# Patient Record
Sex: Male | Born: 2000 | Hispanic: Yes | Marital: Single | Smoking: Never smoker
Health system: Southern US, Community
[De-identification: ages and names within clinical notes are randomized; demographics above are authoritative.]

## PROBLEM LIST (undated history)

## (undated) DIAGNOSIS — R51 Headache: Secondary | ICD-10-CM

## (undated) DIAGNOSIS — R519 Headache, unspecified: Secondary | ICD-10-CM

## (undated) DIAGNOSIS — R002 Palpitations: Secondary | ICD-10-CM

## (undated) DIAGNOSIS — K5190 Ulcerative colitis, unspecified, without complications: Secondary | ICD-10-CM

## (undated) DIAGNOSIS — I517 Cardiomegaly: Secondary | ICD-10-CM

## (undated) HISTORY — DX: Ulcerative colitis, unspecified, without complications: K51.90

## (undated) HISTORY — DX: Palpitations: R00.2

## (undated) HISTORY — DX: Headache, unspecified: R51.9

## (undated) HISTORY — PX: NO PAST SURGERIES: SHX2092

## (undated) HISTORY — DX: Headache: R51

---
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
## 2018-01-03 ENCOUNTER — Ambulatory Visit: Payer: Managed Care, Other (non HMO) | Admitting: Family Medicine

## 2018-01-03 ENCOUNTER — Encounter: Payer: Self-pay | Admitting: Family Medicine

## 2018-01-03 VITALS — BP 124/83 | HR 78 | Temp 98.7°F | Resp 20 | Ht 62.75 in | Wt 187.0 lb

## 2018-01-03 DIAGNOSIS — Z131 Encounter for screening for diabetes mellitus: Secondary | ICD-10-CM

## 2018-01-03 DIAGNOSIS — Z130 Encounter for screening for diseases of the blood and blood-forming organs and certain disorders involving the immune mechanism: Secondary | ICD-10-CM

## 2018-01-03 DIAGNOSIS — Z7689 Persons encountering health services in other specified circumstances: Secondary | ICD-10-CM

## 2018-01-03 DIAGNOSIS — E669 Obesity, unspecified: Secondary | ICD-10-CM | POA: Diagnosis not present

## 2018-01-03 DIAGNOSIS — R002 Palpitations: Secondary | ICD-10-CM | POA: Diagnosis not present

## 2018-01-03 DIAGNOSIS — Z0001 Encounter for general adult medical examination with abnormal findings: Secondary | ICD-10-CM

## 2018-01-03 LAB — LIPID PANEL: LDL Cholesterol: 115

## 2018-01-03 NOTE — Patient Instructions (Signed)

## 2018-01-03 NOTE — Progress Notes (Signed)
Patient ID: Willie James, male  DOB: Aug 26, 2000, 17 y.o.   MRN: [id number] Patient Care Team    Relationship Specialty Notifications Start End  Ma Hillock, DO PCP - General Family Medicine  01/03/18     Chief Complaint  Patient presents with  . Establish Care  . Annual Exam    Subjective:  Willie James is a 17 y.o.  male present for new patient establishment. All past medical history, surgical history, allergies, family history, immunizations, medications and social history were updated in the electronic medical record today. All recent labs, ED visits and hospitalizations within the last year were reviewed.  Moved from [place] with his parents and bother this year. Started Summer classes at [employer]. Has classes schedule for fall. 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  Living with parents currently. Does not routinely exercise. He had an episode with chest pain - which he states his heart felt like it skipped or stopped when he was playing a sport in 2017. He states it happened a few times. He had and ekg which showed LVH, and then a normal echo.   Health maintenance:  Colonoscopy: no fhx, routine screen.  Immunizations: tdap UTD 2017, Influenza UTD 2018 (encouraged yearly) Infectious disease screening: HIV not indicated (never SA) Assistive device: none Oxygen IRC:VELF Patient has a Dental home. Hospitalizations/ED visits: reviewed.  Establishing with eye doctor and dentist. New to area.   Depression screen [facility] 2/9 01/03/2018  Decreased Interest 0  Down, Depressed, Hopeless 0  PHQ - 2 Score 0   No flowsheet data found.  Current Exercise Habits: The patient does not participate in regular exercise at present Exercise limited by: None identified No flowsheet data found.   Immunization History  Administered Date(s) Administered  . DTaP 04/13/1999, 06/22/1999, 08/04/1999, 09/02/2000, 03/19/2004  . HPV Bivalent 05/05/2002, 02/25/2012, 09/22/2012  . HPV  Quadrivalent 10/27/2015  . Hepatitis A 02/07/2009, 08/15/2009  . Hepatitis B 05/21/1999, 03/16/1999, 09/25/1999  . HiB (PRP-OMP) 04/13/1999, 05/29/1999, 08/04/1999, 05/27/2000  . IPV 05/29/1999, 06/22/1999, 09/25/1999, 03/19/2004  . Influenza Split 03/14/2010, 04/08/2011, 03/17/2013  . Influenza-Unspecified 04/09/2016, 02/21/2017  . MMR 05/27/2000, 02/04/2003  . Meningococcal Conjugate 01/11/2011  . Meningococcal Polysaccharide 10/27/2015  . Pneumococcal Conjugate-13 09/25/1999, 12/25/1999, 09/02/2000  . Tdap 01/11/2011, 10/27/2015  . Varicella 02/05/2000, 03/14/2010, 10/27/2015    No exam data present  Past Medical History:  Diagnosis Date  . Frequent headaches   . Palpitations 10/30/2015   pt reports palpitaitons and chest discomfort. EKG (reported LVH) and then NORMAL echo completed.    No Known Allergies Past Surgical History:  Procedure Laterality Date  . NO PAST SURGERIES     Family History  Problem Relation Age of Onset  . Hyperlipidemia Father   . Arthritis Maternal Grandmother   . Diabetes Maternal Grandmother   . Hyperlipidemia Maternal Grandmother   . Hypertension Maternal Grandmother   . Hypertension Maternal Grandfather   . Hyperlipidemia Maternal Grandfather   . Prostate cancer Maternal Grandfather   . Cervical cancer Paternal Grandmother   . Hypertension Paternal Grandfather   . Heart disease Paternal Grandfather    Social History   Socioeconomic History  . Marital status: Single    Spouse name: Not on file  . Number of children: Not on file  . Years of education: Not on file  . Highest education level: Not on file  Occupational History  . Not on file  Social Needs  . Financial resource strain: Not on file  .  Food insecurity:    Worry: Not on file    Inability: Not on file  . Transportation needs:    Medical: Not on file    Non-medical: Not on file  Tobacco Use  . Smoking status: Never Smoker  . Smokeless tobacco: Never Used  Substance and  Sexual Activity  . Alcohol use: Never    Frequency: Never  . Drug use: Never  . Sexual activity: Never  Lifestyle  . Physical activity:    Days per week: Not on file    Minutes per session: Not on file  . Stress: Not on file  Relationships  . Social connections:    Talks on phone: Not on file    Gets together: Not on file    Attends religious service: Not on file    Active member of club or organization: Not on file    Attends meetings of clubs or organizations: Not on file    Relationship status: Not on file  . Intimate partner violence:    Fear of current or ex partner: Not on file    Emotionally abused: Not on file    Physically abused: Not on file    Forced sexual activity: Not on file  Other Topics Concern  . Not on file  Social History Narrative   Marital status/children/pets: Single.    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     All past medical history, surgical history, allergies, family history, immunizations andmedications were updated in the EMR today and reviewed under the history and medication portions of their EMR.    No results found for this or any previous visit (from the past 2160 hour(s)).  Patient was never admitted.   ROS: 14 pt review of systems performed and negative (unless mentioned in an HPI)  Objective: BP 124/83 (BP Location: Right Arm, Patient Position: Sitting, Cuff Size: Large)   Pulse 78   Temp 98.7 F (37.1 C)   Resp 20   Ht 5' 2.75" (1.594 m)   Wt 187 lb (84.8 kg)   SpO2 98%   BMI 33.39 kg/m  Gen: Afebrile. No acute distress. Nontoxic in appearance, well-developed, well-nourished,  Obese, male.  HENT: AT. [place].  Bilateral TM visualized and normal in appearance, normal external auditory canal. MMM, no oral lesions, adequate dentition. Bilateral nares within normal limits. Throat without erythema, ulcerations or exudates. no Cough on exam, no hoarseness on exam. Eyes:Pupils Equal Round Reactive to light, Extraocular movements intact,  Conjunctiva without redness, discharge or icterus. Neck/lymp/endocrine: Supple,no lymphadenopathy, no thyromegaly CV: RRR no murmur, no edema, +2/4 P posterior tibialis pulses.  No JVD. Chest: CTAB, no wheeze, rhonchi or crackles. normal Respiratory effort. good Air movement. Abd: Soft. flat. NTND. BS present. no Masses palpated. No hepatosplenomegaly. No rebound tenderness or guarding. Skin: no rashes, purpura or petechiae. Warm and well-perfused. Skin intact. Neuro/Msk:  Normal gait. PERLA. EOMi. Alert. Oriented x3.  Cranial nerves II through XII intact. Muscle strength 5/5 upper/lower extremity. DTRs equal bilaterally. Psych: Normal affect, dress and demeanor. Normal speech. Normal  thought content and judgment.   Assessment/plan: Willie James is a 17 y.o. male present for est/cpe.  Obesity (BMI 30-39.9) Diet and exercise discussed.  - Direct LDL Palpitations - TSH Screening for deficiency anemia - CBC Diabetes mellitus screening - HgB A1c  Encounter for general adult medical examination with abnormal findings Patient was encouraged to exercise greater than 150 minutes a week. Patient was encouraged to choose a diet filled with fresh fruits and vegetables, and lean meats. AVS provided to patient today for education/recommendation on gender specific health and safety maintenance. Return in about 1 year (around 01/04/2019) for CPE.   Note is dictated utilizing voice recognition software. Although note has been proof read prior to signing, occasional typographical errors still can be missed. If any questions arise, please do not hesitate to call for verification.    Electronically signed by: Howard Pouch, DO [place]

## 2018-01-04 LAB — CBC
HCT: 44.7 % (ref 36.0–49.0)
HEMOGLOBIN: 16.1 g/dL (ref 12.0–16.9)
MCH: 30.8 pg (ref 25.0–35.0)
MCHC: 36 g/dL (ref 31.0–36.0)
MCV: 85.6 fL (ref 78.0–98.0)
MPV: 10.3 fL (ref 7.5–12.5)
Platelets: 302 10*3/uL (ref 140–400)
RBC: 5.22 10*6/uL (ref 4.10–5.70)
RDW: 12.7 % (ref 11.0–15.0)
WBC: 6.8 10*3/uL (ref 4.5–13.0)

## 2018-01-04 LAB — HEMOGLOBIN A1C
Hgb A1c MFr Bld: 4.9 %{Hb}
Mean Plasma Glucose: 94 (calc)
eAG (mmol/L): 5.2 (calc)

## 2018-01-04 LAB — TSH: TSH: 1.8 m[IU]/L (ref 0.50–4.30)

## 2018-01-04 LAB — LDL CHOLESTEROL, DIRECT: Direct LDL: 115 mg/dL — ABNORMAL HIGH (ref <110)

## 2018-01-05 ENCOUNTER — Encounter: Payer: Self-pay | Admitting: Family Medicine

## 2018-01-09 ENCOUNTER — Telehealth: Payer: Self-pay | Admitting: Family Medicine

## 2018-01-09 NOTE — Telephone Encounter (Signed)
Received and will call patient when able to pick up.

## 2018-01-09 NOTE — Telephone Encounter (Signed)
Patient dropped off [employer] immunization form to be completed.

## 2018-01-11 NOTE — Telephone Encounter (Signed)
Patient has picked up form

## 2019-01-05 ENCOUNTER — Other Ambulatory Visit: Payer: Self-pay

## 2019-01-05 ENCOUNTER — Ambulatory Visit (INDEPENDENT_AMBULATORY_CARE_PROVIDER_SITE_OTHER): Payer: Managed Care, Other (non HMO) | Admitting: Family Medicine

## 2019-01-05 ENCOUNTER — Encounter: Payer: Self-pay | Admitting: Family Medicine

## 2019-01-05 VITALS — BP 129/87 | HR 92 | Temp 98.6°F | Resp 17 | Ht 64.17 in | Wt 191.4 lb

## 2019-01-05 DIAGNOSIS — Z13220 Encounter for screening for lipoid disorders: Secondary | ICD-10-CM

## 2019-01-05 DIAGNOSIS — Z0000 Encounter for general adult medical examination without abnormal findings: Secondary | ICD-10-CM | POA: Diagnosis not present

## 2019-01-05 DIAGNOSIS — Z131 Encounter for screening for diabetes mellitus: Secondary | ICD-10-CM | POA: Diagnosis not present

## 2019-01-05 DIAGNOSIS — E669 Obesity, unspecified: Secondary | ICD-10-CM

## 2019-01-05 LAB — LIPID PANEL
Cholesterol: 185 mg/dL (ref 0–200)
HDL: 30.4 mg/dL — ABNORMAL LOW (ref >39.00)
NonHDL: 154.61
Total CHOL/HDL Ratio: 6
Triglycerides: 242 mg/dL — ABNORMAL HIGH (ref 0.0–149.0)
VLDL: 48.4 mg/dL — ABNORMAL HIGH (ref 0.0–40.0)

## 2019-01-05 LAB — HEMOGLOBIN A1C: Hgb A1c MFr Bld: 4.9 % (ref 4.6–6.5)

## 2019-01-05 LAB — LDL CHOLESTEROL, DIRECT: Direct LDL: 103 mg/dL

## 2019-01-05 NOTE — Progress Notes (Signed)
Patient ID: Willie James, male  DOB: 2000/12/31, 18 y.o.   MRN: [id number] Patient Care Team    Relationship Specialty Notifications Start End  Ma Hillock, DO PCP - General Family Medicine  01/03/18     Chief Complaint  Patient presents with  . Annual Exam    fasting. not up to date on HIV screening    Subjective:  Willie James is a 18 y.o.  Male  present for CPE. All past medical history, surgical history, allergies, family history, immunizations, medications and social history were updated in the electronic medical record today. All recent labs, ED visits and hospitalizations within the last year were reviewed.   Started Summer classes at [employer]  Last year. 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 He is thinking of sitting out this semester and transferring schools.   Health maintenance:  Colonoscopy: no fhx, routine screen.  Immunizations: tdap UTD 2017, Influenza UTD (encouraged yearly) Infectious disease screening: HIV not indicated (never SA) Assistive device: none Oxygen MWU:XLKG Patient has a Dental home. Hospitalizations/ED visits: reviewed  Depression screen [facility] 2/9 01/05/2019 01/03/2018  Decreased Interest 0 0  Down, Depressed, Hopeless 0 0  PHQ - 2 Score 0 0   No flowsheet data found.  Immunization History  Administered Date(s) Administered  . DTaP 04/13/1999, 06/22/1999, 08/04/1999, 09/02/2000, 03/19/2004  . HPV 9-valent 10/27/2015  . HPV Bivalent 05/05/2002, 02/25/2012, 09/22/2012  . HPV Quadrivalent 02/25/2012, 05/05/2012, 09/22/2012, 10/27/2015  . Hepatitis A 02/07/2009, 08/15/2009  . Hepatitis B 03-19-99, 03/16/1999, 09/25/1999  . Hepatitis B, ped/adol 09-03-1998, 03/16/1999, 09/25/1999  . HiB (PRP-OMP) 04/13/1999, 05/29/1999, 08/04/1999, 05/27/2000  . IPV 05/29/1999, 06/22/1999, 09/25/1999, 03/19/2004  . Influenza Split 03/14/2010, 04/08/2011, 03/17/2013  . Influenza,inj,Quad PF,6+ Mos 06/04/2018  . Influenza,inj,quad, With  Preservative 04/09/2016  . Influenza-Unspecified 04/27/2008, 02/07/2009, 03/14/2010, 04/08/2011, 03/17/2013, 04/09/2016, 02/21/2017, 06/04/2018  . MMR 05/27/2000, 02/04/2003  . Meningococcal Conjugate 01/11/2011, 10/27/2015  . Meningococcal Polysaccharide 10/27/2015  . Pneumococcal Conjugate-13 09/25/1999, 12/25/1999, 09/02/2000  . Tdap 01/11/2011, 10/27/2015  . Varicella 02/05/2000, 03/14/2010, 10/27/2015    Past Medical History:  Diagnosis Date  . Frequent headaches   . Left ventricular hypertrophy by electrocardiogram 10/28/2015   echo after was normal.   . Palpitations 10/30/2015   pt reports palpitaitons and chest discomfort. EKG (reported LVH) and then NORMAL echo completed.    No Known Allergies Past Surgical History:  Procedure Laterality Date  . NO PAST SURGERIES     Family History  Problem Relation Age of Onset  . Hyperlipidemia Father   . Arthritis Maternal Grandmother   . Diabetes Maternal Grandmother   . Hyperlipidemia Maternal Grandmother   . Hypertension Maternal Grandmother   . Hypertension Maternal Grandfather   . Hyperlipidemia Maternal Grandfather   . Prostate cancer Maternal Grandfather   . Cervical cancer Paternal Grandmother   . Hypertension Paternal Grandfather   . Heart disease Paternal Grandfather    Social History   Social History Narrative   Marital status/children/pets: Single.    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     All past medical history, surgical history, allergies, family history, immunizations andmedications were updated in the EMR today and reviewed under the history and medication portions of their  EMR.     No results found for this or any previous visit (from the past 2160 hour(s)).  Patient was never admitted.   ROS: 14 pt review of systems performed and negative (unless mentioned in an HPI)  Objective: BP 129/87 (BP Location: Right Arm, Patient Position: Sitting, Cuff Size: Normal)   Pulse 92   Temp 98.6 F (37 C) (Temporal)   Resp 17   Ht 5' 4.17" (1.63 m)   Wt 191 lb 6 oz (86.8 kg)   SpO2 97%   BMI 32.67 kg/m  Gen: Afebrile. No acute distress. Nontoxic in appearance, well-developed, well-nourished,  Pleasant male.  HENT: AT. [place]. Bilateral TM visualized and normal in appearance, normal external auditory canal. MMM, no oral lesions, adequate dentition. Bilateral nares within normal limits. Throat without erythema, ulcerations or exudates. no Cough on exam, no hoarseness on exam. Eyes:Pupils Equal Round Reactive to light, Extraocular movements intact,  Conjunctiva without redness, discharge or icterus. Neck/lymp/endocrine: Supple,no lymphadenopathy, no thyromegaly CV: RRR no murmur, no edema, +2/4 P posterior tibialis pulses. no carotid bruits. No JVD. Chest: CTAB, no wheeze, rhonchi or crackles. normal Respiratory effort. good Air movement. Abd: Soft. falt. NTND. BS present. no Masses palpated. No hepatosplenomegaly. No rebound tenderness or guarding. Skin: no rashes, purpura or petechiae. Warm and well-perfused. Skin intact. Neuro/Msk:  Normal gait. PERLA. EOMi. Alert. Oriented x3.  Cranial nerves II through XII intact. Muscle strength 5/5 upper/lower extremity. DTRs equal bilaterally. Psych: Normal affect, dress and demeanor. Normal speech. Normal thought content and judgment.  No exam data present  Assessment/plan: Willie James is a 18 y.o. male present for CPE Lipid screening - Lipid panel Diabetes mellitus screenin - Hemoglobin A1c Obesity (BMI 30-39.9) - Lipid panel - pt encouraged to be more physical active.  Encounter for preventive health exam:   Patient was encouraged to exercise greater than 150 minutes a week. Patient was encouraged to choose a diet filled with fresh fruits and vegetables, and lean meats. AVS provided to patient today for education/recommendation on gender specific health and safety maintenance. Colonoscopy: no fhx, routine screen.  Immunizations: tdap UTD 2017, Influenza UTD (encouraged yearly) Infectious disease screening: HIV not indicated (never SA)  Return in about 1 year (around 01/05/2020) for CPE (30 min).  Electronically signed by: Howard Pouch, DO [place]

## 2019-01-05 NOTE — Patient Instructions (Signed)

## 2020-01-07 ENCOUNTER — Ambulatory Visit (INDEPENDENT_AMBULATORY_CARE_PROVIDER_SITE_OTHER): Payer: 59 | Admitting: Family Medicine

## 2020-01-07 ENCOUNTER — Other Ambulatory Visit: Payer: Self-pay

## 2020-01-07 ENCOUNTER — Encounter: Payer: Self-pay | Admitting: Family Medicine

## 2020-01-07 VITALS — BP 135/84 | HR 92 | Temp 98.0°F | Ht 64.3 in | Wt 202.4 lb

## 2020-01-07 DIAGNOSIS — Z0000 Encounter for general adult medical examination without abnormal findings: Secondary | ICD-10-CM

## 2020-01-07 DIAGNOSIS — E669 Obesity, unspecified: Secondary | ICD-10-CM | POA: Diagnosis not present

## 2020-01-07 DIAGNOSIS — E781 Pure hyperglyceridemia: Secondary | ICD-10-CM | POA: Diagnosis not present

## 2020-01-07 DIAGNOSIS — Z130 Encounter for screening for diseases of the blood and blood-forming organs and certain disorders involving the immune mechanism: Secondary | ICD-10-CM | POA: Diagnosis not present

## 2020-01-07 DIAGNOSIS — L600 Ingrowing nail: Secondary | ICD-10-CM | POA: Insufficient documentation

## 2020-01-07 LAB — COMPREHENSIVE METABOLIC PANEL
ALT: 14 U/L (ref 0–53)
AST: 16 U/L (ref 0–37)
Albumin: 4.7 g/dL (ref 3.5–5.2)
Alkaline Phosphatase: 65 U/L (ref 39–117)
BUN: 20 mg/dL (ref 6–23)
CO2: 26 mEq/L (ref 19–32)
Calcium: 9.8 mg/dL (ref 8.4–10.5)
Chloride: 103 mEq/L (ref 96–112)
Creatinine, Ser: 0.9 mg/dL (ref 0.40–1.50)
GFR: 106.59 mL/min (ref >60.00)
Glucose, Bld: 90 mg/dL (ref 70–99)
Potassium: 4.1 mEq/L (ref 3.5–5.1)
Sodium: 139 mEq/L (ref 135–145)
Total Bilirubin: 0.5 mg/dL (ref 0.2–1.2)
Total Protein: 7.4 g/dL (ref 6.0–8.3)

## 2020-01-07 LAB — LIPID PANEL
Cholesterol: 175 mg/dL (ref 0–200)
HDL: 30.6 mg/dL — ABNORMAL LOW (ref >39.00)
NonHDL: 144.79
Total CHOL/HDL Ratio: 6
Triglycerides: 350 mg/dL — ABNORMAL HIGH (ref 0.0–149.0)
VLDL: 70 mg/dL — ABNORMAL HIGH (ref 0.0–40.0)

## 2020-01-07 LAB — CBC
HCT: 44.6 % (ref 39.0–52.0)
Hemoglobin: 15.3 g/dL (ref 13.0–17.0)
MCHC: 34.3 g/dL (ref 30.0–36.0)
MCV: 90 fl (ref 78.0–100.0)
Platelets: 257 10*3/uL (ref 150.0–400.0)
RBC: 4.95 Mil/uL (ref 4.22–5.81)
RDW: 13 % (ref 11.5–14.6)
WBC: 8.3 10*3/uL (ref 4.5–10.5)

## 2020-01-07 LAB — LDL CHOLESTEROL, DIRECT: Direct LDL: 80 mg/dL

## 2020-01-07 LAB — TSH: TSH: 2.95 u[IU]/mL (ref 0.35–5.50)

## 2020-01-07 MED ORDER — MUPIROCIN 2 % EX OINT: 1.0000 "application " | TOPICAL_OINTMENT | Freq: Two times a day (BID) | CUTANEOUS | 0 refills | Status: DC

## 2020-01-07 NOTE — Patient Instructions (Addendum)
Preventive Care 23-21 Years Old, Male Preventive care refers to lifestyle choices and visits with your health care provider that can promote health and wellness. At this stage in your life, you may start seeing a primary care physician instead of a pediatrician. Your health care is now your responsibility. Preventive care for young adults includes:  A yearly physical exam. This is also called an annual wellness visit.  Regular dental and eye exams.  Immunizations.  Screening for certain conditions.  Healthy lifestyle choices, such as diet and exercise. What can I expect for my preventive care visit? Physical exam Your health care provider may check:  Height and weight. These may be used to calculate body mass index (BMI), which is a measurement that tells if you are at a healthy weight.  Heart rate and blood pressure.  Body temperature. Counseling Your health care provider may ask you questions about:  Past medical problems and family medical history.  Alcohol, tobacco, and drug use.  Home and relationship well-being.  Access to firearms.  Emotional well-being.  Diet, exercise, and sleep habits.  Sexual activity and sexual health. What immunizations do I need?  Influenza (flu) vaccine  This is recommended every year. Tetanus, diphtheria, and pertussis (Tdap) vaccine  You may need a Td booster every 10 years. Varicella (chickenpox) vaccine  You may need this vaccine if you have not already been vaccinated. Human papillomavirus (HPV) vaccine  If recommended by your health care provider, you may need three doses over 6 months. Measles, mumps, and rubella (MMR) vaccine  You may need at least one dose of MMR. You may also need a second dose. Meningococcal conjugate (MenACWY) vaccine  One dose is recommended if you are 68-54 years old and a Market researcher living in a residence hall, or if you have one of several medical conditions. You may also need  additional booster doses. Pneumococcal conjugate (PCV13) vaccine  You may need this if you have certain conditions and were not previously vaccinated. Pneumococcal polysaccharide (PPSV23) vaccine  You may need one or two doses if you smoke cigarettes or if you have certain conditions. Hepatitis A vaccine  You may need this if you have certain conditions or if you travel or work in places where you may be exposed to hepatitis A. Hepatitis B vaccine  You may need this if you have certain conditions or if you travel or work in places where you may be exposed to hepatitis B. Haemophilus influenzae type b (Hib) vaccine  You may need this if you have certain risk factors. You may receive vaccines as individual doses or as more than one vaccine together in one shot (combination vaccines). Talk with your health care provider about the risks and benefits of combination vaccines. What tests do I need? Blood tests  Lipid and cholesterol levels. These may be checked every 5 years starting at age 24.  Hepatitis C test.  Hepatitis B test. Screening  Genital exam to check for testicular cancer or hernias.  Sexually transmitted disease (STD) testing, if you are at risk. Other tests  Tuberculosis skin test.  Vision and hearing tests.  Skin exam. Follow these instructions at home: Eating and drinking   Eat a diet that includes fresh fruits and vegetables, whole grains, lean protein, and low-fat dairy products.  Drink enough fluid to keep your urine pale yellow.  Do not drink alcohol if: ? Your health care provider tells you not to drink. ? You are under the legal drinking  age. In the [place] the legal drinking age is 64.  If you drink alcohol: ? Limit how much you have to 0-2 drinks a day. ? Be aware of how much alcohol is in your drink. In the [place] one drink equals one 12 oz bottle of beer (355 mL), one 5 oz glass of wine (148 mL), or one 1 oz glass of hard liquor (44  mL). Lifestyle  Take daily care of your teeth and gums.  Stay active. Exercise at least 30 minutes 5 or more days of the week.  Do not use any products that contain nicotine or tobacco, such as cigarettes, e-cigarettes, and chewing tobacco. If you need help quitting, ask your health care provider.  Do not use drugs.  If you are sexually active, practice safe sex. Use a condom or other form of protection to prevent STIs (sexually transmitted infections).  Find healthy ways to cope with stress, such as: ? Meditation, yoga, or listening to music. ? Journaling. ? Talking to a trusted person. ? Spending time with friends and family. Safety  Always wear your seat belt while driving or riding in a vehicle.  Do not drive if you have been drinking alcohol.  Do not ride with someone who has been drinking.  Do not drive when you are tired or distracted.  Do not text while driving.  Wear a helmet and other protective equipment during sports activities.  If you have firearms in your house, make sure you follow all gun safety procedures.  Seek help if you have been bullied, physically abused, or sexually abused.  Use the Internet responsibly to avoid dangers such as online bullying and online sex predators. What's next?  Go to your health care provider once a year for a well check visit.  Ask your health care provider how often you should have your eyes and teeth checked.  Stay up to date on all vaccines. This information is not intended to replace advice given to you by your health care provider. Make sure you discuss any questions you have with your health care provider. Document Revised: 05/04/2018 Document Reviewed: 05/04/2018 Elsevier Patient Education  [place].   Body mass index is 34.42 kg/m. is your BMI   BMI for Adults What is BMI? Body mass index (BMI) is a number that is calculated from a person's weight and height. BMI can help estimate how much of a  person's weight is composed of fat. BMI does not measure body fat directly. Rather, it is an alternative to procedures that directly measure body fat, which can be difficult and expensive. BMI can help identify people who may be at higher risk for certain medical problems. What are BMI measurements used for? BMI is used as a screening tool to identify possible weight problems. It helps determine whether a person is obese, overweight, a healthy weight, or underweight. BMI is useful for:  Identifying a weight problem that may be related to a medical condition or may increase the risk for medical problems.  Promoting changes, such as changes in diet and exercise, to help reach a healthy weight. BMI screening can be repeated to see if these changes are working. How is BMI calculated? BMI involves measuring your weight in relation to your height. Both height and weight are measured, and the BMI is calculated from those numbers. This can be done either in [place] ([place] or metric measurements. Note that charts and online BMI calculators are available to help you find your  BMI quickly and easily without having to do these calculations yourself. To calculate your BMI in English (U[place] measurements:  1. Measure your weight in pounds (lb). 2. Multiply the number of pounds by 703. ? For example, for a person who weighs 180 lb, multiply that number by 703, which equals 126,540. 3. Measure your height in inches. Then multiply that number by itself to get a measurement called "inches squared." ? For example, for a person who is 70 inches tall, the "inches squared" measurement is 70 inches x 70 inches, which equals 4,900 inches squared. 4. Divide the total from step 2 (number of lb x 703) by the total from step 3 (inches squared): 126,540  4,900 = 25.8. This is your BMI. To calculate your BMI in metric measurements: 1. Measure your weight in kilograms (kg). 2. Measure your height in meters (m). Then multiply  that number by itself to get a measurement called "meters squared." ? For example, for a person who is 1.75 m tall, the "meters squared" measurement is 1.75 m x 1.75 m, which is equal to 3.1 meters squared. 3. Divide the number of kilograms (your weight) by the meters squared number. In this example: 70  3.1 = 22.6. This is your BMI. What do the results mean? BMI charts are used to identify whether you are underweight, normal weight, overweight, or obese. The following guidelines will be used:  Underweight: BMI less than 18.5.  Normal weight: BMI between 18.5 and 24.9.  Overweight: BMI between 25 and 29.9.  Obese: BMI of 30 or above. Keep these notes in mind:  Weight includes both fat and muscle, so someone with a muscular build, such as an athlete, may have a BMI that is higher than 24.9. In cases like these, BMI is not an accurate measure of body fat.  To determine if excess body fat is the cause of a BMI of 25 or higher, further assessments may need to be done by a health care provider.  BMI is usually interpreted in the same way for men and women. Where to find more information For more information about BMI, including tools to quickly calculate your BMI, go to these websites:  Centers for Disease Control and Prevention: http://www.wolf.info/  American Heart Association: www.heart.org  National Heart, Lung, and Blood Institute: https://wilson-eaton.com/ Summary  Body mass index (BMI) is a number that is calculated from a person's weight and height.  BMI may help estimate how much of a person's weight is composed of fat. BMI can help identify those who may be at higher risk for certain medical problems.  BMI can be measured using English measurements or metric measurements.  BMI charts are used to identify whether you are underweight, normal weight, overweight, or obese. This information is not intended to replace advice given to you by your health care provider. Make sure you discuss any  questions you have with your health care provider. Document Revised: 01/31/2019 Document Reviewed: 12/08/2018 Elsevier Patient Education  [place].

## 2020-01-07 NOTE — Progress Notes (Signed)
 This visit occurred during the SARS-CoV-2 public health emergency.  Safety protocols were in place, including screening questions prior to the visit, additional usage of staff PPE, and extensive cleaning of exam room while observing appropriate contact time as indicated for disinfecting solutions.    Patient ID: Willie James, male  DOB: 09/12/2000, 18 y.o.   MRN: [id number] Patient Care Team    Relationship Specialty Notifications Start End  ,  A, DO PCP - General Family Medicine  01/03/18     Chief Complaint  Patient presents with  . Annual Exam    Subjective:  Willie James is a 18 y.o.  Male  present for CPE. All past medical history, surgical history, allergies, family history, immunizations, medications and social history were updated in the electronic medical record today. All recent labs, ED visits and hospitalizations within the last year were reviewed.  In attendance with classes at [employer]  Wants to pursue a career in Computer science.   Pt is fasting.   He has been experiencing left large toe nail pain for the last 4 weeks since he trimmed his nail back. He denies redness or drainage. He endorses discomfort.   Health maintenance: Colonoscopy:no fhx, routine screen at 43 Immunizations: tdapUTD 2017, InfluenzaUTD (encouraged yearly), COVID series completed Infectious disease screening: HIVnot indicated (never SA) Assistive device: none Oxygen use:none Patient has a Dental home. Hospitalizations/ED visits: reviewed  Depression screen PHQ 2/9 01/07/2020 01/05/2019 01/03/2018  Decreased Interest 0 0 0  Down, Depressed, Hopeless 0 0 0  PHQ - 2 Score 0 0 0   No flowsheet data found.    Immunization History  Administered Date(s) Administered  . DTaP 04/13/1999, 06/22/1999, 08/04/1999, 09/02/2000, 03/19/2004  . HPV 9-valent 10/27/2015  . HPV Bivalent 05/05/2002, 02/25/2012, 09/22/2012  . HPV Quadrivalent 02/25/2012, 05/05/2012, 09/22/2012,  10/27/2015  . Hepatitis A 02/07/2009, 08/15/2009  . Hepatitis B 02/03/1999, 03/16/1999, 09/25/1999  . Hepatitis B, ped/adol 02/03/1999, 03/16/1999, 09/25/1999  . HiB (PRP-OMP) 04/13/1999, 05/29/1999, 08/04/1999, 05/27/2000  . IPV 05/29/1999, 06/22/1999, 09/25/1999, 03/19/2004  . Influenza Inj Mdck Quad Pf 01/19/2019  . Influenza Split 03/14/2010, 04/08/2011, 03/17/2013  . Influenza,inj,Quad PF,6+ Mos 06/04/2018  . Influenza,inj,quad, With Preservative 04/09/2016  . Influenza-Unspecified 04/27/2008, 02/07/2009, 03/14/2010, 04/08/2011, 03/17/2013, 04/09/2016, 02/21/2017, 06/04/2018, 01/19/2019  . MMR 05/27/2000, 02/04/2003  . Meningococcal Conjugate 01/11/2011, 10/27/2015  . Meningococcal Polysaccharide 10/27/2015  . Moderna SARS-COVID-2 Vaccination 08/29/2019, 09/29/2019  . Pneumococcal Conjugate-13 09/25/1999, 12/25/1999, 09/02/2000  . Tdap 01/11/2011, 10/27/2015  . Varicella 02/05/2000, 03/14/2010, 10/27/2015     Past Medical History:  Diagnosis Date  . Frequent headaches   . Left ventricular hypertrophy by electrocardiogram 10/28/2015   echo after was normal.   . Palpitations 10/30/2015   pt reports palpitaitons and chest discomfort. EKG (reported LVH) and then NORMAL echo completed.    No Known Allergies Past Surgical History:  Procedure Laterality Date  . NO PAST SURGERIES     Family History  Problem Relation Age of Onset  . Hyperlipidemia Father   . Arthritis Maternal Grandmother   . Diabetes Maternal Grandmother   . Hyperlipidemia Maternal Grandmother   . Hypertension Maternal Grandmother   . Hypertension Maternal Grandfather   . Hyperlipidemia Maternal Grandfather   . Prostate cancer Maternal Grandfather   . Cervical cancer Paternal Grandmother   . Hypertension Paternal Grandfather   . Heart disease Paternal Grandfather    Social History   Social History Narrative   Marital status/children/pets: Single.    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 If you have any questions, ask your nurse or doctor.        mupirocin ointment 2 % Commonly known as: Bactroban Apply 1 application topically 2 (two) times daily. To affected area. Started by: Howard Pouch, DO       All past medical history, surgical history, allergies, family history, immunizations andmedications were updated in the EMR today and reviewed under the history and medication portions of their EMR.     No results found for this or any previous visit (from the past 2160 hour(s)).  Patient was never admitted.   ROS: 14 pt review of systems performed and negative (unless mentioned in an HPI)  Objective: BP 135/84   Pulse 92   Temp 98 F (36.7 C)   Ht 5' 4.3" (1.633 m)   Wt 202 lb 6.4 oz (91.8 kg)   SpO2 97%   BMI 34.42 kg/m  Gen: Afebrile. No acute distress. Nontoxic in appearance, well-developed, well-nourished,  Pleasant male.  HENT: AT. [place]. Bilateral TM visualized and normal in appearance, normal external auditory canal. MMM, no oral lesions, adequate dentition. Bilateral nares within normal limits. Throat without erythema, ulcerations or exudates. no Cough on exam, no hoarseness on exam. Eyes:Pupils Equal Round Reactive to light, Extraocular movements intact,  Conjunctiva without redness, discharge or icterus. Neck/lymp/endocrine: Supple,no lymphadenopathy, no thyromegaly CV: RRR no murmur, no edema, +2/4 P posterior tibialis pulses. No JVD. Chest: CTAB, no wheeze, rhonchi or crackles. normal Respiratory effort. good Air movement. Abd: Soft. flat. NTND. BS present. no Masses palpated. No hepatosplenomegaly. No rebound tenderness or guarding. Skin: no rashes, purpura or petechiae.  Warm and well-perfused. Skin intact. Neuro/Msk:  Normal gait. PERLA. EOMi. Alert. Oriented x3.  Cranial nerves II through XII intact. Muscle strength 5/5 upper/lower extremity. DTRs equal bilaterally. Scab formation left medial toenail. No erythema, no swelling, no drainage. Mild TTP.  Psych: Normal affect, dress and demeanor. Normal speech. Normal thought content and judgment.   No exam data present  Assessment/plan: Willie James is a 19 y.o. male present for  Obesity (BMI 30-39.9)/Hypertriglyceridemia Diet and exercise encouraged.  BP borderline- low sodium diet encouraged.  - Comp Met (CMET) - TSH - Lipid panel Screening for deficiency anemia - CBC Encounter for preventive health examination Patient was encouraged to exercise greater than 150 minutes a week. Patient was encouraged to choose a diet filled with fresh fruits and vegetables, and lean meats. AVS provided to patient today for education/recommendation on gender specific health and safety maintenance. Colonoscopy:no fhx, routine screen at 73 Immunizations: tdapUTD 2017, InfluenzaUTD (encouraged yearly), COVID series completed Infectious disease screening: HIVnot indicated (never SA)  Ingrown toenail:  epson salt soaks Qd encouraged.  Bactroban ointment BID.  Watch closely> f/u if worsening or not improving in 2-4 weeks.   Return in about 1 year (around 01/06/2021) for CPE (30 min).    Orders Placed This Encounter  Procedures  . CBC  . Comp Met (CMET)  . TSH  . Lipid panel   Meds ordered this encounter  Medications  . mupirocin ointment (BACTROBAN) 2 %  Sig: Apply 1 application topically 2 (two) times daily. To affected area.    Dispense:  22 g    Refill:  0   Referral Orders  No referral(s) requested today     Electronically signed by: Howard Pouch, [place]

## 2020-01-08 ENCOUNTER — Telehealth: Payer: Self-pay | Admitting: Family Medicine

## 2020-01-08 MED ORDER — OMEGA-3 FISH OIL 1200 MG PO CAPS: 1.0000 | ORAL_CAPSULE | Freq: Every day | ORAL | 3 refills | Status: DC

## 2020-01-08 NOTE — Telephone Encounter (Signed)
Left detailed message on VM.

## 2020-01-08 NOTE — Telephone Encounter (Signed)
Please call patient Liver, kidney and thyroid function are normal Blood cell counts, sugar/glucose and electrolytes are normal Cholesterol panel overall looks good and everything is at goal, with the exception of the triglyceride portion of the panel is high at 350.  Triglycerides should be less than 150.  High triglycerides place patients at risk for heart disease. I would recommend a mediterranean diet and regular exercise.  A mediterranean diet is high in fruits, vegetables, whole grains, fish, chicken, nuts, healthy fats (olive oil or canola oil). Low fat dairy.  Limit butter, margarine, red meat and sweets.  Regular exercise is a goal of 150 minutes a week of exercise to start.  This is helpful in bringing down triglyceride levels. Increasing fiber in the diet either by food source or supplement source is helpful in bringing down triglycerides.  There are fiber Gummies, fiber crackers/cookies, fiber powder such as Metamucil all of which are good sources of fiber if not getting enough in the diet. Lastly, the start of a omega-3 supplement which is a fish oil supplement 1000-2000 mg daily, many people take this before bed but can be taken anytime of the day.  I did call this to his pharmacy for him, however if insurance does not cover this since it is a supplement it is available over-the-counter.  I would recommend follow-up in 6 months on his hypertriglyceridemia.  This gives him time to implement the above dietary and exercise modifications and start omega-3 and fiber.  He should come fasting to that appointment (nothing to eat or drink after midnight-only water)

## 2020-04-04 ENCOUNTER — Encounter: Payer: Self-pay | Admitting: Family Medicine

## 2020-04-04 ENCOUNTER — Ambulatory Visit: Payer: 59 | Admitting: Family Medicine

## 2020-04-04 ENCOUNTER — Other Ambulatory Visit: Payer: Self-pay

## 2020-04-04 VITALS — BP 142/83 | HR 81 | Temp 98.6°F | Ht 63.0 in | Wt 205.0 lb

## 2020-04-04 DIAGNOSIS — B360 Pityriasis versicolor: Secondary | ICD-10-CM | POA: Diagnosis not present

## 2020-04-04 MED ORDER — SELENIUM SULFIDE 2.5 % EX LOTN: TOPICAL_LOTION | CUTANEOUS | 0 refills | Status: DC

## 2020-04-04 MED ORDER — CLOTRIMAZOLE 1 % EX CREA: 1.0000 "application " | TOPICAL_CREAM | Freq: Two times a day (BID) | CUTANEOUS | 1 refills | Status: DC

## 2020-04-04 NOTE — Progress Notes (Signed)
This visit occurred during the SARS-CoV-2 public health emergency.  Safety protocols were in place, including screening questions prior to the visit, additional usage of staff PPE, and extensive cleaning of exam room while observing appropriate contact time as indicated for disinfecting solutions.    Rutherford Nail , August 22, 2000, 19 y.o., male MRN: [id number] Patient Care Team    Relationship Specialty Notifications Start End  [name], DO PCP - General Family Medicine  01/03/18     Chief Complaint  Patient presents with  . Skin Discoloration    pt noticed skin discoloration on LA for about 1 wk;     Subjective: Pt presents for an OV with complaints of hypopigmented area on his left forearm he noticed about 1 week  Ago. He does not think it has any irritation. He has not tried anything. He has been exposed to nay new products.   Depression screen [facility] 2/9 04/04/2020 01/07/2020 01/05/2019 01/03/2018  Decreased Interest 0 0 0 0  Down, Depressed, Hopeless 0 0 0 0  PHQ - 2 Score 0 0 0 0    No Known Allergies Social History   Social History Narrative   Marital status/children/pets: Single.    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 Frequent headaches   . Left ventricular hypertrophy by electrocardiogram 10/28/2015   echo after was normal.   . Palpitations 10/30/2015   pt reports palpitaitons and chest discomfort. EKG (reported LVH) and then NORMAL echo completed.    Past Surgical History:  Procedure Laterality Date  . NO PAST SURGERIES     Family History  Problem Relation Age of Onset  . Hyperlipidemia Father   . Arthritis Maternal Grandmother   . Diabetes Maternal Grandmother   . Hyperlipidemia Maternal Grandmother   . Hypertension Maternal Grandmother     . Hypertension Maternal Grandfather   . Hyperlipidemia Maternal Grandfather   . Prostate cancer Maternal Grandfather   . Cervical cancer Paternal Grandmother   . Hypertension Paternal Grandfather   . Heart disease Paternal Grandfather    Allergies as of 04/04/2020   No Known Allergies     Medication List       Accurate as of April 04, 2020  3:13 PM. If you have any questions, ask your nurse or doctor.        STOP taking these medications   mupirocin ointment 2 % Commonly known as: Bactroban Stopped by: Felix Pacini, DO   Omega-3 Fish Oil 1200 MG Caps Stopped by: Felix Pacini, DO     TAKE these medications   clotrimazole 1 % cream Commonly known as: Clotrimazole Anti-Fungal Apply 1 application topically 2 (two) times daily. Started by: Felix Pacini, DO   omega-3 acid ethyl esters 1 g capsule Commonly known as: LOVAZA Take 1 capsule by mouth at bedtime.   selenium sulfide 2.5 % shampoo Commonly known as: SELSUN Lather over affected area daily for 7 days, leave on for 10 minutes then rinse. Started by: Felix Pacini, DO       All past medical history, surgical history, allergies, family history, immunizations andmedications were updated in the EMR today and reviewed under the history and medication portions of their EMR.  ROS: Negative, with the exception of above mentioned in HPI   Objective:  BP (!) 142/83   Pulse 81   Temp 98.6 F (37 C) (Oral)   Ht 5\' 3"  (1.6 m)   Wt 205 lb (93 kg)   SpO2 96%   BMI 36.31 kg/m  Body mass index is 36.31 kg/m. Gen: Afebrile. No acute distress. Nontoxic in appearance, well developed, well nourished.  HENT: AT. [place]. Skin: hypopigmented areas over left forearm. No other rashes, No purpura or petechiae.  Neuro: . Alert. Oriented x3  No exam data present No results found. No results found for this or any previous visit (from the past 24 hour(s)).  Assessment/Plan: Taim Wurm is a 19 y.o. male present for OV  for  Tinea versicolor Selenium sulfide shampoo application daily for 7 days (leave on for 10 minutes) Clotrimazole cream BID x2-4 weeks until completely resolved.  F/u PRN   Reviewed expectations re: course of current medical issues.  Discussed self-management of symptoms.  Outlined signs and symptoms indicating need for more acute intervention.  Patient verbalized understanding and all questions were answered.  Patient received an After-Visit Summary.    No orders of the defined types were placed in this encounter.  Meds ordered this encounter  Medications  . clotrimazole (CLOTRIMAZOLE ANTI-FUNGAL) 1 % cream    Sig: Apply 1 application topically 2 (two) times daily.    Dispense:  85 g    Refill:  1  . selenium sulfide (SELSUN) 2.5 % shampoo    Sig: Lather over affected area daily for 7 days, leave on for 10 minutes then rinse.    Dispense:  118 mL    Refill:  0   Referral Orders  No referral(s) requested today     Note is dictated utilizing voice recognition software. Although note has been proof read prior to signing, occasional typographical errors still can be missed. If any questions arise, please do not hesitate to call for verification.   electronically signed by:  36, DO  [facility] [place]

## 2020-04-04 NOTE — Patient Instructions (Signed)
Tinea Versicolor  Tinea versicolor is a skin infection. It is caused by a type of yeast. It is normal for some yeast to be on your skin, but too much yeast causes this infection. The infection causes a rash of light or dark patches on your skin. The rash is most common on the chest, back, neck, or upper arms. The infection usually does not cause other problems. If it is treated, it will probably go away in a few weeks. The infection cannot be spread from one person to another (is not contagious). Follow these instructions at home:  Use over-the-counter and prescription medicines only as told by your doctor.  Scrub your skin every day with dandruff shampoo as told by your doctor.  Do not scratch your skin in the rash area.  Avoid places that are hot and humid.  Do not use tanning booths.  Try to avoid sweating a lot. Contact a doctor if:  Your symptoms get worse.  You have a fever.  You have redness, swelling, or pain in the rash area.  You have fluid or blood coming from your rash.  Your rash feels warm to the touch.  You have pus or a bad smell coming from your rash.  Your rash comes back (recurs) after treatment. Summary  Tinea versicolor is a skin infection. It causes a rash of light or dark patches on your skin.  The rash is most common on the chest, back, neck, or upper arms. This infection usually does not cause other problems.  Use over-the-counter and prescription medicines only as told by your doctor.  If the infection is treated, it will probably go away in a few weeks. This information is not intended to replace advice given to you by your health care provider. Make sure you discuss any questions you have with your health care provider. Document Revised: 04/22/2017 Document Reviewed: 01/10/2017 Elsevier Patient Education  2020 Elsevier Inc.  

## 2020-04-25 ENCOUNTER — Other Ambulatory Visit: Payer: Self-pay

## 2020-04-25 ENCOUNTER — Ambulatory Visit: Payer: 59 | Admitting: Family Medicine

## 2020-04-25 ENCOUNTER — Encounter: Payer: Self-pay | Admitting: Family Medicine

## 2020-04-25 VITALS — BP 122/81 | HR 92 | Temp 98.6°F | Resp 16 | Ht 63.0 in | Wt 209.0 lb

## 2020-04-25 DIAGNOSIS — B360 Pityriasis versicolor: Secondary | ICD-10-CM

## 2020-04-25 DIAGNOSIS — L819 Disorder of pigmentation, unspecified: Secondary | ICD-10-CM

## 2020-04-25 MED ORDER — KETOCONAZOLE 2 % EX SHAM: MEDICATED_SHAMPOO | CUTANEOUS | 0 refills | Status: DC

## 2020-04-25 MED ORDER — CICLOPIROX OLAMINE 0.77 % EX CREA: TOPICAL_CREAM | Freq: Two times a day (BID) | CUTANEOUS | 0 refills | Status: DC

## 2020-04-25 NOTE — Patient Instructions (Signed)
New cream- every 12 hours for 2-4 weeks. New shampoo/cleanser- use once a week for two weeks. Lather over wet skin from elbow to hand. Rinse after 5 minutes,   I also referred you to dermatology just in case this is a skin condition  and not fungal .   Tinea Versicolor  Tinea versicolor is a skin infection. It is caused by a type of yeast. It is normal for some yeast to be on your skin, but too much yeast causes this infection. The infection causes a rash of light or dark patches on your skin. The rash is most common on the chest, back, neck, or upper arms. The infection usually does not cause other problems. If it is treated, it will probably go away in a few weeks. The infection cannot be spread from one person to another (is not contagious). Follow these instructions at home:  Use over-the-counter and prescription medicines only as told by your doctor.  Scrub your skin every day with dandruff shampoo as told by your doctor.  Do not scratch your skin in the rash area.  Avoid places that are hot and humid.  Do not use tanning booths.  Try to avoid sweating a lot. Contact a doctor if:  Your symptoms get worse.  You have a fever.  You have redness, swelling, or pain in the rash area.  You have fluid or blood coming from your rash.  Your rash feels warm to the touch.  You have pus or a bad smell coming from your rash.  Your rash comes back (recurs) after treatment. Summary  Tinea versicolor is a skin infection. It causes a rash of light or dark patches on your skin.  The rash is most common on the chest, back, neck, or upper arms. This infection usually does not cause other problems.  Use over-the-counter and prescription medicines only as told by your doctor.  If the infection is treated, it will probably go away in a few weeks. This information is not intended to replace advice given to you by your health care provider. Make sure you discuss any questions you have with  your health care provider. Document Revised: 04/22/2017 Document Reviewed: 01/10/2017 Elsevier Patient Education  2020 [employer].

## 2020-04-25 NOTE — Progress Notes (Signed)
This visit occurred during the SARS-CoV-2 public health emergency.  Safety protocols were in place, including screening questions prior to the visit, additional usage of staff PPE, and extensive cleaning of exam room while observing appropriate contact time as indicated for disinfecting solutions.    Willie James , 08/28/2000, 19 y.o., male MRN: [id number] Patient Care Team    Relationship Specialty Notifications Start End  [name], DO PCP - General Family Medicine  01/03/18     Chief Complaint  Patient presents with  . Skin discoloration    not seeing improvement, was last seen for same issue on 11/12.     Subjective: Pt presents for an OV with complaints of hypopigmented area on his left forearm that suddenly presented about 5 weeks ago. He was treated with selenium and topical antifungal without any improvement. Also no worsening.    Depression screen [facility] 2/9 04/04/2020 01/07/2020 01/05/2019 01/03/2018  Decreased Interest 0 0 0 0  Down, Depressed, Hopeless 0 0 0 0  PHQ - 2 Score 0 0 0 0    No Known Allergies Social History   Social History Narrative   Marital status/children/pets: Single.    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 Frequent headaches   . Left ventricular hypertrophy by electrocardiogram 10/28/2015   echo after was normal.   . Palpitations 10/30/2015   pt reports palpitaitons and chest discomfort. EKG (reported LVH) and then NORMAL echo completed.    Past Surgical History:  Procedure Laterality Date  . NO PAST SURGERIES     Family History  Problem Relation Age of Onset  . Hyperlipidemia Father   . Arthritis Maternal Grandmother   . Diabetes Maternal Grandmother   . Hyperlipidemia Maternal Grandmother   . Hypertension Maternal  Grandmother   . Hypertension Maternal Grandfather   . Hyperlipidemia Maternal Grandfather   . Prostate cancer Maternal Grandfather   . Cervical cancer Paternal Grandmother   . Hypertension Paternal Grandfather   . Heart disease Paternal Grandfather    Allergies as of 04/25/2020   No Known Allergies     Medication List       Accurate as of April 25, 2020 10:35 AM. If you have any questions, ask your nurse or doctor.        clotrimazole 1 % cream Commonly known as: Clotrimazole Anti-Fungal Apply 1 application topically 2 (two) times daily.   omega-3 acid ethyl esters 1 g capsule Commonly known as: LOVAZA Take 1 capsule by mouth at bedtime.   selenium sulfide 2.5 % shampoo Commonly known as: SELSUN Lather over affected area daily for 7 days, leave on for 10 minutes then rinse.       All past medical history, surgical history, allergies, family history, immunizations andmedications were updated in the EMR today and reviewed under the history and medication portions of their EMR.     ROS: Negative, with the exception of above mentioned in HPI   Objective:  BP 122/81 (BP Location: Left Arm, Patient Position: Sitting, Cuff Size: Large)   Pulse 92   Temp 98.6 F (37 C) (Oral)   Resp 16   Ht 5\' 3"  (1.6 m)   Wt 209 lb (  94.8 kg)   SpO2 96%   BMI 37.02 kg/m  Body mass index is 37.02 kg/m. Gen: Afebrile. No acute distress.  Neuro: Normal gait. PERLA. EOMi. Alert. Oriented x3  Skin: hypopigmented areas over left forearm- remains the same. No other rashes, No purpura or petechiae.   No exam data present No results found. No results found for this or any previous visit (from the past 24 hour(s)).  Assessment/Plan: Willie James is a 19 y.o. male present for OV for  Tinea versicolor vs vitiligo  resistant to first round of topical antifungals.  Start ciclopirox BID x 2-4 weeks.  Start ketoconazole shampoo x2 treatments only- explained process to pt.  Referral to  derm placed in the event his lesions due not resolve with new treatment.  F/u PRN   Reviewed expectations re: course of current medical issues.  Discussed self-management of symptoms.  Outlined signs and symptoms indicating need for more acute intervention.  Patient verbalized understanding and all questions were answered.  Patient received an After-Visit Summary.    No orders of the defined types were placed in this encounter.  No orders of the defined types were placed in this encounter.  Referral Orders  No referral(s) requested today     Note is dictated utilizing voice recognition software. Although note has been proof read prior to signing, occasional typographical errors still can be missed. If any questions arise, please do not hesitate to call for verification.   electronically signed by:  Felix Pacini, DO  [facility] [place]

## 2020-09-24 ENCOUNTER — Other Ambulatory Visit: Payer: Self-pay

## 2020-09-24 ENCOUNTER — Ambulatory Visit: Payer: 59 | Admitting: Dermatology

## 2020-09-24 ENCOUNTER — Encounter: Payer: Self-pay | Admitting: Dermatology

## 2020-09-24 DIAGNOSIS — L309 Dermatitis, unspecified: Secondary | ICD-10-CM

## 2020-09-24 MED ORDER — CLOBETASOL PROPIONATE 0.05 % EX CREA: 1.0000 "application " | TOPICAL_CREAM | Freq: Two times a day (BID) | CUTANEOUS | 1 refills | Status: DC

## 2020-10-09 ENCOUNTER — Encounter: Payer: Self-pay | Admitting: Dermatology

## 2020-10-09 NOTE — Progress Notes (Signed)
   New Patient   Subjective  Willie James is a 20 y.o. male who presents for the following: Skin Problem (White spots on left arm- recently it gotten dark - + itch and red tx- PCP gave ketoconazole shampoo & ciclopirox cream neither one helped).  Rash with discoloration left arm Location:  Duration:  Quality:  Associated Signs/Symptoms: Modifying Factors:  Severity:  Timing: Context:    The following portions of the chart were reviewed this encounter and updated as appropriate:  Tobacco  Allergies  Meds  Problems  Med Hx  Surg Hx  Fam Hx      Objective  Well appearing patient in no apparent distress; mood and affect are within normal limits. Objective  Left Forearm - Anterior: Subtle nonmarginated chronic dermatitis with patches of postinflammatory hyperpigmentation.  Differential diagnosis includes contact dermatitis and asymmetric adult eczema.  Tinea corporis or TV less likely.    A focused examination was performed including Waist up.. Relevant physical exam findings are noted in the Assessment and Plan.   Assessment & Plan  Dermatitis Left Forearm - Anterior  Will apply topical clobetasol daily after bathing for 3 weeks; follow-up by MyChart or telephone at that time.  clobetasol cream (TEMOVATE) 0.05 % - Left Forearm - Anterior

## 2021-10-20 ENCOUNTER — Encounter: Payer: Self-pay | Admitting: Family Medicine

## 2021-10-20 ENCOUNTER — Telehealth: Payer: Self-pay | Admitting: Family Medicine

## 2021-10-20 ENCOUNTER — Ambulatory Visit (HOSPITAL_BASED_OUTPATIENT_CLINIC_OR_DEPARTMENT_OTHER)
Admission: RE | Admit: 2021-10-20 | Discharge: 2021-10-20 | Disposition: A | Discharge status: Home / Self Care | Payer: 59 | Source: Ambulatory Visit | Attending: Family Medicine | Admitting: Family Medicine

## 2021-10-20 ENCOUNTER — Ambulatory Visit: Payer: 59 | Admitting: Family Medicine

## 2021-10-20 VITALS — BP 137/89 | HR 97 | Wt 213.8 lb

## 2021-10-20 DIAGNOSIS — R194 Change in bowel habit: Secondary | ICD-10-CM

## 2021-10-20 DIAGNOSIS — R1033 Periumbilical pain: Secondary | ICD-10-CM

## 2021-10-20 DIAGNOSIS — K649 Unspecified hemorrhoids: Secondary | ICD-10-CM

## 2021-10-20 MED ORDER — SENNA-DOCUSATE SODIUM 8.6-50 MG PO TABS: ORAL_TABLET | ORAL | 1 refills | Status: DC

## 2021-10-20 MED ORDER — HYDROCORTISONE ACETATE 25 MG RE SUPP: 25.0000 mg | Freq: Two times a day (BID) | RECTAL | 0 refills | Status: DC

## 2021-10-20 MED ORDER — WITCH HAZEL-GLYCERIN EX PADS: MEDICATED_PAD | CUTANEOUS | 0 refills | Status: DC

## 2021-10-20 NOTE — Telephone Encounter (Signed)
Spoke with pt regarding results/recommendations,voiced understanding. ? ?

## 2021-10-20 NOTE — Patient Instructions (Addendum)
[address], [place], [place] [address]> have xray at [facility].   Drink 80 ounces of water daily.    Hemorrhoids Hemorrhoids are swollen veins that may develop: In the butt (rectum). These are called internal hemorrhoids. Around the opening of the butt (anus). These are called external hemorrhoids. Hemorrhoids can cause pain, itching, or bleeding. Most of the time, they do not cause serious problems. They usually get better with diet changes, lifestyle changes, and other home treatments. What are the causes? This condition may be caused by: Having trouble pooping (constipation). Pushing hard (straining) to poop. Watery poop (diarrhea). Pregnancy. Being very overweight (obese). Sitting for long periods of time. Heavy lifting or other activity that causes you to strain. Anal sex. Riding a bike for a long period of time. What are the signs or symptoms? Symptoms of this condition include: Pain. Itching or soreness in the butt. Bleeding from the butt. Leaking poop. Swelling in the area. One or more lumps around the opening of your butt. How is this diagnosed? A doctor can often diagnose this condition by looking at the affected area. The doctor may also: Do an exam that involves feeling the area with a gloved hand (digital rectal exam). Examine the area inside your butt using a small tube (anoscope). Order blood tests. This may be done if you have lost a lot of blood. Have you get a test that involves looking inside the colon using a flexible tube with a camera on the end (sigmoidoscopy or colonoscopy). How is this treated? This condition can usually be treated at home. Your doctor may tell you to change what you eat, make lifestyle changes, or try home treatments. If these do not help, procedures can be done to remove the hemorrhoids or make them smaller. These may involve: Placing rubber bands at the base of the hemorrhoids to cut off their blood  supply. Injecting medicine into the hemorrhoids to shrink them. Shining a type of light energy onto the hemorrhoids to cause them to fall off. Doing surgery to remove the hemorrhoids or cut off their blood supply. Follow these instructions at home: Eating and drinking  Eat foods that have a lot of fiber in them. These include whole grains, beans, nuts, fruits, and vegetables. Ask your doctor about taking products that have added fiber (fibersupplements). Reduce the amount of fat in your diet. You can do this by: Eating low-fat dairy products. Eating less red meat. Avoiding processed foods. Drink enough fluid to keep your pee (urine) pale yellow. Managing pain and swelling  Take a warm-water bath (sitz bath) for 20 minutes to ease pain. Do this 3-4 times a day. You may do this in a bathtub or using a portable sitz bath that fits over the toilet. If told, put ice on the painful area. It may be helpful to use ice between your warm baths. Put ice in a plastic bag. Place a towel between your skin and the bag. Leave the ice on for 20 minutes, 2-3 times a day. General instructions Take over-the-counter and prescription medicines only as told by your doctor. Medicated creams and medicines may be used as told. Exercise often. Ask your doctor how much and what kind of exercise is best for you. Go to the bathroom when you have the urge to poop. Do not wait. Avoid pushing too hard when you poop. Keep your butt dry and clean. Use wet toilet paper or moist towelettes after pooping. Do not sit on  the toilet for a long time. Keep all follow-up visits as told by your doctor. This is important. Contact a doctor if you: Have pain and swelling that do not get better with treatment or medicine. Have trouble pooping. Cannot poop. Have pain or swelling outside the area of the hemorrhoids. Get help right away if you have: Bleeding that will not stop. Summary Hemorrhoids are swollen veins in the butt or  around the opening of the butt. They can cause pain, itching, or bleeding. Eat foods that have a lot of fiber in them. These include whole grains, beans, nuts, fruits, and vegetables. Take a warm-water bath (sitz bath) for 20 minutes to ease pain. Do this 3-4 times a day. This information is not intended to replace advice given to you by your health care provider. Make sure you discuss any questions you have with your health care provider. Document Revised: 11/19/2020 Document Reviewed: 11/19/2020 Elsevier Patient Education  2023 [employer].

## 2021-10-20 NOTE — Telephone Encounter (Signed)
Please call patient His x-ray appears to have quite a bit of stool burden present throughout the colon.  We will call him tomorrow once we receive formal read as well.  Recommendations: 1.  Start hydrocortisone rectal suppositories every 12 hours until bleeding subsides. 2.  Start Senokot-S, 2 tabs tonight, then 1 tab nightly.  This will start to get his bowel habits more routine and quantity will likely increase until he relieves the stool burden he currently has.  Once his bowel movements become routine and he does not have any more symptoms of abdominal discomfort, he can discontinue the Senokot altogether and use as needed only.   -I would recommend when he gets up in the morning he allows himself ample time in the bathroom to encourage complete emptying (20-30 minutes) 3.  Increase his water intake to at least 80-90 ounces a day. 4.  Use prescribed Tucks pads after toilet tissue when having bowel movement.  This helps keep clean and also relieve some of the inflammation around the hemorrhoids.  It is common to note abdominal cramping with the use of above products when constipation is being relieved.  If this occurs that should subside once stool burden is evacuated.   If abdominal discomfort or bleeding continues after 2 weeks.  I would encourage him to follow-up at that time.

## 2021-10-20 NOTE — Progress Notes (Signed)
Willie James , 03/07/2001, 21 y.o., male MRN: [id number] Patient Care Team    Relationship Specialty Notifications Start End  [name], DO PCP - General Family Medicine  01/03/18   Janalyn Harder, MD Consulting Physician Dermatology  09/24/20     Chief Complaint  Patient presents with   Abdominal Pain    Started 2 weeks ago. Starts to hurt when standing. Noticed blood in stool on Saturday 10/17/21. Pt is not fasting     Subjective: Pt presents for an OV with complaints of periumbilical discomfort, intermittent of 2 weeks duration.  Associated symptoms include daily intermittent discomfort of his abdomen. Worse at night, and when standing/walking. He has noticed harder stools over this time. He denies rectal pain with BM. He has noticed small amount of BRPPR on his toilet tissue after movements since Saturday.  He reports he sometimes will feel as if he has to have a BM, but will not produce one. Last BM today- which he states was his normal in consistency and quantity- again small amount of blood on toilet tissue.  Pt has tried nothing to ease their symptoms.      10/20/2021    1:58 PM 04/04/2020    2:55 PM 01/07/2020    9:55 AM 01/05/2019   10:03 AM 01/03/2018    3:02 PM  Depression screen PHQ 2/9  Decreased Interest 0 0 0 0 0  Down, Depressed, Hopeless 0 0 0 0 0  PHQ - 2 Score 0 0 0 0 0    No Known Allergies Social History   Social History Narrative   Marital status/children/pets: Single.    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    Palpitations 10/30/2015   pt reports palpitaitons and chest discomfort. EKG (reported LVH) and then NORMAL echo  completed.    Past Surgical History:  Procedure Laterality Date   NO PAST SURGERIES     Family History  Problem Relation Age of Onset   Hyperlipidemia Father    Arthritis Maternal Grandmother    Diabetes Maternal Grandmother    Hyperlipidemia Maternal Grandmother    Hypertension Maternal Grandmother    Hypertension Maternal Grandfather    Hyperlipidemia Maternal Grandfather    Prostate cancer Maternal Grandfather    Cervical cancer Paternal Grandmother    Hypertension Paternal Grandfather    Heart disease Paternal Grandfather    Allergies as of 10/20/2021   No Known Allergies      Medication List        Accurate as of Oct 20, 2021  4:28 PM. If you have any questions, ask your nurse or doctor.          ciclopirox 0.77 % cream Commonly known as: LOPROX Apply topically 2 (two) times daily.   clobetasol cream 0.05 % Commonly known as: TEMOVATE Apply 1 application topically 2 (two) times daily.   hydrocortisone 25 MG suppository Commonly known as: ANUSOL-HC Place 1 suppository (25 mg total) rectally 2 (two) times daily. Started by: Felix Pacini, DO   ibuprofen 200 MG tablet Commonly known as: ADVIL Take  200 mg by mouth every 6 (six) hours as needed.   ketoconazole 2 % shampoo Commonly known as: NIZORAL Apply one application by lathering over wet skin. Rinse after 5 minutes. Repeat once 1 week later.   omega-3 acid ethyl esters 1 g capsule Commonly known as: LOVAZA Take 1 capsule by mouth at bedtime.   sennosides-docusate sodium 8.6-50 MG tablet Commonly known as: SENOKOT-S 2 tabs before bed on day 1, then 1 tab nightly Started by: Felix Pacini, DO   witch hazel-glycerin pad Commonly known as: TUCKS Use after bowel movement Started by: Felix Pacini, DO        All past medical history, surgical history, allergies, family history, immunizations andmedications were updated in the EMR today and reviewed under the history and medication portions of their  EMR.     Review of Systems  Constitutional:  Negative for chills, fever, malaise/fatigue and weight loss.  Gastrointestinal:  Positive for abdominal pain, blood in stool and constipation. Negative for diarrhea, melena, nausea and vomiting.  Genitourinary:  Negative for frequency and urgency.  Skin:  Negative for itching and rash.  Negative, with the exception of above mentioned in HPI   Objective:  BP 137/89   Pulse 97   Wt 213 lb 12.8 oz (97 kg)   SpO2 96%   BMI 37.87 kg/m  Body mass index is 37.87 kg/m. Physical Exam Vitals and nursing note reviewed.  Constitutional:      General: He is not in acute distress.    Appearance: Normal appearance. He is not ill-appearing, toxic-appearing or diaphoretic.  HENT:     Head: Normocephalic and atraumatic.  Eyes:     General: No scleral icterus.       Right eye: No discharge.        Left eye: No discharge.     Extraocular Movements: Extraocular movements intact.     Pupils: Pupils are equal, round, and reactive to light.  Abdominal:     General: Abdomen is protuberant. Bowel sounds are normal. There is distension. There is no abdominal bruit. There are no signs of injury.     Palpations: Abdomen is soft. There is no shifting dullness, fluid wave, hepatomegaly, splenomegaly, mass or pulsatile mass.     Tenderness: There is no abdominal tenderness. There is no guarding or rebound. Negative signs include Murphy's sign, McBurney's sign and obturator sign.     Hernia: No hernia is present.     Comments: Moderate stool burden present.   Skin:    General: Skin is warm and dry.     Coloration: Skin is not jaundiced or pale.     Findings: No rash.  Neurological:     Mental Status: He is alert and oriented to person, place, and time. Mental status is at baseline.  Psychiatric:        Mood and Affect: Mood normal.        Behavior: Behavior normal.        Thought Content: Thought content normal.        Judgment: Judgment normal.     No  results found. No results found. No results found for this or any previous visit (from the past 24 hour(s)).  Assessment/Plan: Willie James is a 21 y.o. male present for OV for   Bowel habit changes/Periumbilical pain Suspect constipation causing hemorrhoidal bleeding. Exam is negative for obvious hernia or tenderness. Seems to have moderate stool burden.  - daily stool softener> prescribed Senokot-S Hydrate 80 ounces of water  daily.  - DG Abd 2 Views; Future  Hemorrhoids, unspecified hemorrhoid type Rectal hydrocort suppository twice daily until symptoms are resolved Hygiene/tucks  Follow-up in 2 weeks if symptoms are not resolved  Reviewed expectations re: course of current medical issues. Discussed self-management of symptoms. Outlined signs and symptoms indicating need for more acute intervention. Patient verbalized understanding and all questions were answered. Patient received an After-Visit Summary.    Orders Placed This Encounter  Procedures   DG Abd 2 Views   Meds ordered this encounter  Medications   sennosides-docusate sodium (SENOKOT-S) 8.6-50 MG tablet    Sig: 2 tabs before bed on day 1, then 1 tab nightly    Dispense:  60 tablet    Refill:  1   hydrocortisone (ANUSOL-HC) 25 MG suppository    Sig: Place 1 suppository (25 mg total) rectally 2 (two) times daily.    Dispense:  12 suppository    Refill:  0    Please provide Hydrocortisone (rectal) generic- formulary.   witch hazel-glycerin (TUCKS) pad    Sig: Use after bowel movement    Dispense:  40 each    Refill:  0   Referral Orders  No referral(s) requested today     Note is dictated utilizing voice recognition software. Although note has been proof read prior to signing, occasional typographical errors still can be missed. If any questions arise, please do not hesitate to call for verification.   electronically signed by:  Felix Pacinienee Batsheva Stevick, DO  [facility] [place]

## 2021-10-22 ENCOUNTER — Telehealth: Payer: Self-pay | Admitting: Family Medicine

## 2021-10-22 NOTE — Telephone Encounter (Signed)
Spoke with pt regarding labs and instructions.   

## 2021-10-22 NOTE — Telephone Encounter (Signed)
Please inform patient, he is x-ray results have been received and do not show evidence of any bowel obstruction or inflammation.  It did show evidence of moderate stool burden throughout the colon.   Continue the treatment plan discussed at his appointment.

## 2021-11-01 ENCOUNTER — Encounter: Payer: Self-pay | Admitting: Family Medicine

## 2021-11-02 ENCOUNTER — Ambulatory Visit (INDEPENDENT_AMBULATORY_CARE_PROVIDER_SITE_OTHER): Payer: 59 | Admitting: Family Medicine

## 2021-11-02 VITALS — BP 123/84 | HR 109 | Temp 98.7°F | Wt 206.2 lb

## 2021-11-02 DIAGNOSIS — K625 Hemorrhage of anus and rectum: Secondary | ICD-10-CM | POA: Diagnosis not present

## 2021-11-02 DIAGNOSIS — K649 Unspecified hemorrhoids: Secondary | ICD-10-CM

## 2021-11-02 DIAGNOSIS — R194 Change in bowel habit: Secondary | ICD-10-CM | POA: Diagnosis not present

## 2021-11-02 NOTE — Progress Notes (Signed)
Willie James , 2000-07-01, 21 y.o., male MRN: [id number] Patient Care Team    Relationship Specialty Notifications Start End  [name], DO PCP - General Family Medicine  01/03/18   Janalyn Harder, MD Consulting Physician Dermatology  09/24/20     Chief Complaint  Patient presents with   Rectal Bleeding    Has not changed since last visit and now seems to be blood clots that are coming out. Starting a week ago     Subjective: Willie James is 21 y.o. male present for rectal bleeding.  He has increased his water consumption, taking stool softeners and using the steroid suppository.  He reports he has had many bowel movements and feels it has improved his stomach discomfort.  He states now his discomfort is more low in his pelvis and feels like pressure, like he has to have a bowel movement.  He has noticed the bleeding has worsened instead of being present on his toilet tissue, now he seen small blood clots in the toilet bowl after having a bowel movement.  He does note feeling more fatigued.  He denies dizziness, fever or chills.  He also has noted some nausea when eating.  Prior note: Pt presents for an OV with complaints of periumbilical discomfort, intermittent of 2 weeks duration.  Associated symptoms include daily intermittent discomfort of his abdomen. Worse at night, and when standing/walking. He has noticed harder stools over this time. He denies rectal pain with BM. He has noticed small amount of BRPPR on his toilet tissue after movements since Saturday.  He reports he sometimes will feel as if he has to have a BM, but will not produce one. Last BM today- which he states was his normal in consistency and quantity- again small amount of blood on toilet tissue.  Pt has tried nothing to ease their symptoms.      10/20/2021    1:58 PM 04/04/2020    2:55 PM 01/07/2020    9:55 AM 01/05/2019   10:03 AM 01/03/2018    3:02 PM  Depression screen PHQ 2/9  Decreased Interest 0 0  0 0 0  Down, Depressed, Hopeless 0 0 0 0 0  PHQ - 2 Score 0 0 0 0 0    No Known Allergies Social History   Social History Narrative   Marital status/children/pets: Single.    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    Palpitations 10/30/2015   pt reports palpitaitons and chest discomfort. EKG (reported LVH) and then NORMAL echo completed.    Past Surgical History:  Procedure Laterality Date   NO PAST SURGERIES     Family History  Problem Relation Age of Onset   Hyperlipidemia Father    Arthritis Maternal Grandmother    Diabetes Maternal Grandmother    Hyperlipidemia Maternal Grandmother    Hypertension Maternal Grandmother    Hypertension Maternal Grandfather    Hyperlipidemia Maternal Grandfather    Prostate cancer Maternal Grandfather    Cervical cancer Paternal Grandmother    Hypertension Paternal Grandfather    Heart disease Paternal  Grandfather    Allergies as of 11/02/2021   No Known Allergies      Medication List        Accurate as of November 02, 2021  5:24 PM. If you have any questions, ask your nurse or doctor.          ciclopirox 0.77 % cream Commonly known as: LOPROX Apply topically 2 (two) times daily.   clobetasol cream 0.05 % Commonly known as: TEMOVATE Apply 1 application topically 2 (two) times daily.   hydrocortisone 25 MG suppository Commonly known as: ANUSOL-HC Place 1 suppository (25 mg total) rectally 2 (two) times daily.   ibuprofen 200 MG tablet Commonly known as: ADVIL Take 200 mg by mouth every 6 (six) hours as needed.   ketoconazole 2 % shampoo Commonly known as: NIZORAL Apply one application by lathering over  wet skin. Rinse after 5 minutes. Repeat once 1 week later.   omega-3 acid ethyl esters 1 g capsule Commonly known as: LOVAZA Take 1 capsule by mouth at bedtime.   sennosides-docusate sodium 8.6-50 MG tablet Commonly known as: SENOKOT-S 2 tabs before bed on day 1, then 1 tab nightly   witch hazel-glycerin pad Commonly known as: TUCKS Use after bowel movement        All past medical history, surgical history, allergies, family history, immunizations andmedications were updated in the EMR today and reviewed under the history and medication portions of their EMR.     Review of Systems  Constitutional:  Positive for malaise/fatigue. Negative for chills, fever and weight loss.  Gastrointestinal:  Positive for blood in stool and melena. Negative for abdominal pain, constipation, diarrhea, nausea and vomiting.  Genitourinary:  Negative for frequency and urgency.  Skin:  Negative for itching and rash.  Neurological:  Negative for dizziness.   Negative, with the exception of above mentioned in HPI   Objective:  BP 123/84   Pulse (!) 109   Temp 98.7 F (37.1 C)   Wt 206 lb 3.2 oz (93.5 kg)   SpO2 98%   BMI 36.53 kg/m  Body mass index is 36.53 kg/m. Physical Exam Vitals and nursing note reviewed.  Constitutional:      General: He is not in acute distress.    Appearance: Normal appearance. He is not ill-appearing, toxic-appearing or diaphoretic.  HENT:     Head: Normocephalic and atraumatic.  Eyes:     General: No scleral icterus.       Right eye: No discharge.        Left eye: No discharge.     Extraocular Movements: Extraocular movements intact.     Pupils: Pupils are equal, round, and reactive to light.  Cardiovascular:     Rate and Rhythm: Tachycardia present.  Abdominal:     General: Bowel sounds are normal. There is no distension.     Palpations: Abdomen is soft. There is no mass.     Tenderness: There is no abdominal tenderness. There is no guarding or rebound.   Skin:    General: Skin is warm and dry.     Coloration: Skin is not jaundiced or pale.     Findings: No rash.  Neurological:     Mental Status: He is alert and oriented to person, place, and time. Mental status is at baseline.  Psychiatric:        Mood and Affect: Mood normal.        Behavior: Behavior normal.        Thought Content: Thought  content normal.        Judgment: Judgment normal.    Declined rectal exam today.   No results found. No results found. No results found for this or any previous visit (from the past 24 hour(s)).  Assessment/Plan: Willie NailDaniel James is a 21 y.o. male present for OV for   Bowel habit changes/constipation/rectal bleeding Suspect constipation and hemorrhoids as cause.  He reports he has had frequent bowel movements since being seen last and started on stool softeners. He used the steroid suppositories and has not seen any improvements. He is starting to feel more fatigued and stating that he is seeing blood clots in the toilet now. He is now tachycardic, which is new. CBC, TSH, fecal occult x3, iron panel collected today Urgent referral to gastroenterology   Reviewed expectations re: course of current medical issues. Discussed self-management of symptoms. Outlined signs and symptoms indicating need for more acute intervention. Patient verbalized understanding and all questions were answered. Patient received an After-Visit Summary.    Orders Placed This Encounter  Procedures   CBC w/Diff   C-reactive protein   TSH   Iron, TIBC and Ferritin Panel   Ambulatory referral to Gastroenterology   POC Hemoccult Bld/Stl (3-Cd Home Screen)   No orders of the defined types were placed in this encounter.  Referral Orders         Ambulatory referral to Gastroenterology       Note is dictated utilizing voice recognition software. Although note has been proof read prior to signing, occasional typographical errors still can be missed. If any  questions arise, please do not hesitate to call for verification.   electronically signed by:  Felix Pacinienee Maicy Filip, DO  [facility] [place]

## 2021-11-02 NOTE — Patient Instructions (Signed)
Rectal Bleeding  Rectal bleeding is when blood comes out of the opening of the butt (anus). People with this kind of bleeding may notice bright red blood in their underwear or in the toilet after they poop (have a bowel movement). They may also have blood mixed with their poop (stool), or dark red or black poop. Rectal bleeding is often a sign that something is wrong. This condition can be caused by many things. It needs to be checked by a doctor. Your doctor will do tests to know what is causing your condition. Follow these instructions at home: Watch for any changes in your condition. Take these actions to help with bleeding and discomfort: Medicines Take over-the-counter and prescription medicines only as told by your doctor. Ask your doctor about changing or stopping your normal medicines. This is important if you are taking blood thinners. Medicines that thin the blood can make rectal bleeding worse. Managing constipation Your condition may cause trouble pooping (constipation). To prevent or treat trouble pooping, or to help make your poop soft, you may need to: Drink enough fluid to keep your pee (urine) pale yellow. Take over-the-counter or prescription medicines. Eat foods that are high in fiber. These include beans, whole grains, and fresh fruits and vegetables. Limit foods that are high in fat and sugar. These include fried or sweet foods.  General instructions Try not to strain when you poop. Try taking a warm bath. This may help with pain. Keep all follow-up visits as told by your doctor. This is important. Contact a doctor if: You have pain or swelling in your belly (abdomen). You have a fever. You feel weak. You feel like you may vomit. You cannot poop. Get help right away if: You have new bleeding. You have more bleeding than before. You have black or dark red poop. You vomit blood or something that looks like coffee grounds. You pass out (faint). You have very bad pain  in your butt. Summary Rectal bleeding is when blood comes out of the opening of the butt. This bleeding is often a sign that something is wrong. Eat a diet that is high in fiber. This will help to keep your poop soft. Talk to your doctor if you take medicines that thin the blood. These medicines can make bleeding worse. Get help right away if you have new or more bleeding, black or dark red poop, or blood in your vomit. Also, get help if you pass out or have very bad pain in your butt. This information is not intended to replace advice given to you by your health care provider. Make sure you discuss any questions you have with your health care provider. Document Revised: 04/11/2019 Document Reviewed: 04/11/2019 Elsevier Patient Education  2023 Elsevier Inc.  

## 2021-11-03 ENCOUNTER — Telehealth: Payer: Self-pay | Admitting: Family Medicine

## 2021-11-03 LAB — CBC WITH DIFFERENTIAL/PLATELET
Absolute Monocytes: 966 cells/uL — ABNORMAL HIGH (ref 200–950)
Basophils Absolute: 70 cells/uL (ref 0–200)
Basophils Relative: 0.8 %
Eosinophils Absolute: 218 cells/uL (ref 15–500)
Eosinophils Relative: 2.5 %
HCT: 41 % (ref 38.5–50.0)
Hemoglobin: 14.6 g/dL (ref 13.2–17.1)
Lymphs Abs: 2888 cells/uL (ref 850–3900)
MCH: 30.4 pg (ref 27.0–33.0)
MCHC: 35.6 g/dL (ref 32.0–36.0)
MCV: 85.2 fL (ref 80.0–100.0)
MPV: 10.3 fL (ref 7.5–12.5)
Monocytes Relative: 11.1 %
Neutro Abs: 4559 cells/uL (ref 1500–7800)
Neutrophils Relative %: 52.4 %
Platelets: 387 10*3/uL (ref 140–400)
RBC: 4.81 10*6/uL (ref 4.20–5.80)
RDW: 12.2 % (ref 11.0–15.0)
Total Lymphocyte: 33.2 %
WBC: 8.7 10*3/uL (ref 3.8–10.8)

## 2021-11-03 LAB — IRON,TIBC AND FERRITIN PANEL
%SAT: 17 % (calc) — ABNORMAL LOW (ref 20–48)
Ferritin: 82 ng/mL (ref 38–380)
Iron: 50 ug/dL (ref 50–195)
TIBC: 288 mcg/dL (calc) (ref 250–425)

## 2021-11-03 LAB — TSH: TSH: 1.48 mIU/L (ref 0.40–4.50)

## 2021-11-03 LAB — C-REACTIVE PROTEIN: CRP: 13.2 mg/L — ABNORMAL HIGH (ref <8.0)

## 2021-11-03 NOTE — Telephone Encounter (Signed)
Please inform patient: His blood cell counts are normal, no signs of anemia. Iron levels are normal His inflammatory marker is mildly elevated, suggesting inflammation of the colon.  This is typically not present with hemorrhoids and would suggest more inflammation of the colon causing his symptoms.  I had referred him to gastroenterology urgently, can we check on this to make sure they received the most recent lab work with an elevated CRP to expedite his referral.  Thanks.

## 2021-11-03 NOTE — Telephone Encounter (Signed)
Spoke with pt regarding labs and instructions. Routing to referral team as FYI.

## 2021-11-04 ENCOUNTER — Encounter: Payer: Self-pay | Admitting: Gastroenterology

## 2021-11-06 ENCOUNTER — Encounter: Payer: Self-pay | Admitting: Family Medicine

## 2021-11-10 ENCOUNTER — Other Ambulatory Visit (INDEPENDENT_AMBULATORY_CARE_PROVIDER_SITE_OTHER): Payer: 59

## 2021-11-10 ENCOUNTER — Telehealth: Payer: Self-pay | Admitting: Family Medicine

## 2021-11-10 DIAGNOSIS — K649 Unspecified hemorrhoids: Secondary | ICD-10-CM | POA: Diagnosis not present

## 2021-11-10 DIAGNOSIS — K625 Hemorrhage of anus and rectum: Secondary | ICD-10-CM

## 2021-11-10 LAB — HEMOCCULT SLIDES (X 3 CARDS)
Fecal Occult Blood: POSITIVE — AB
OCCULT 1: POSITIVE — AB
OCCULT 2: POSITIVE — AB
OCCULT 3: POSITIVE — AB
OCCULT 4: POSITIVE — AB
OCCULT 5: POSITIVE — AB

## 2021-11-10 NOTE — Telephone Encounter (Signed)
Please inform patient his Hemoccult cards are all positive for blood. I see he has his gastroenterology appointment in approximately 4 weeks.  It is important that if he experiences any fatigue, dizziness, pain, weakness or the bleeding worsens that he be seen emergently in the ED.

## 2021-11-11 NOTE — Telephone Encounter (Signed)
LVM for pt to CB regarding results.  

## 2021-11-11 NOTE — Telephone Encounter (Signed)
Spoke with patient regarding results/recommendations.  

## 2021-11-13 ENCOUNTER — Emergency Department (HOSPITAL_BASED_OUTPATIENT_CLINIC_OR_DEPARTMENT_OTHER)
Admission: EM | Admit: 2021-11-13 | Discharge: 2021-11-13 | Disposition: A | Discharge status: Home / Self Care | Payer: 59 | Attending: Emergency Medicine | Admitting: Emergency Medicine

## 2021-11-13 ENCOUNTER — Other Ambulatory Visit: Payer: Self-pay

## 2021-11-13 ENCOUNTER — Encounter (HOSPITAL_BASED_OUTPATIENT_CLINIC_OR_DEPARTMENT_OTHER): Payer: Self-pay

## 2021-11-13 ENCOUNTER — Emergency Department (HOSPITAL_BASED_OUTPATIENT_CLINIC_OR_DEPARTMENT_OTHER): Payer: 59

## 2021-11-13 DIAGNOSIS — R112 Nausea with vomiting, unspecified: Secondary | ICD-10-CM | POA: Diagnosis present

## 2021-11-13 DIAGNOSIS — D72829 Elevated white blood cell count, unspecified: Secondary | ICD-10-CM | POA: Diagnosis not present

## 2021-11-13 DIAGNOSIS — K625 Hemorrhage of anus and rectum: Secondary | ICD-10-CM | POA: Diagnosis not present

## 2021-11-13 DIAGNOSIS — R000 Tachycardia, unspecified: Secondary | ICD-10-CM | POA: Insufficient documentation

## 2021-11-13 DIAGNOSIS — K5900 Constipation, unspecified: Secondary | ICD-10-CM | POA: Insufficient documentation

## 2021-11-13 DIAGNOSIS — R197 Diarrhea, unspecified: Secondary | ICD-10-CM | POA: Diagnosis not present

## 2021-11-13 DIAGNOSIS — R109 Unspecified abdominal pain: Secondary | ICD-10-CM | POA: Insufficient documentation

## 2021-11-13 LAB — COMPREHENSIVE METABOLIC PANEL
ALT: 11 U/L (ref 0–44)
AST: 25 U/L (ref 15–41)
Albumin: 4.1 g/dL (ref 3.5–5.0)
Alkaline Phosphatase: 54 U/L (ref 38–126)
Anion gap: 14 (ref 5–15)
BUN: 7 mg/dL (ref 6–20)
CO2: 21 mmol/L — ABNORMAL LOW (ref 22–32)
Calcium: 9.5 mg/dL (ref 8.9–10.3)
Chloride: 101 mmol/L (ref 98–111)
Creatinine, Ser: 0.93 mg/dL (ref 0.61–1.24)
GFR, Estimated: >60 mL/min (ref >60)
Glucose, Bld: 85 mg/dL (ref 70–99)
Potassium: 4.1 mmol/L (ref 3.5–5.1)
Sodium: 136 mmol/L (ref 135–145)
Total Bilirubin: 0.4 mg/dL (ref 0.3–1.2)
Total Protein: 7.6 g/dL (ref 6.5–8.1)

## 2021-11-13 LAB — CBC
HCT: 37.6 % — ABNORMAL LOW (ref 39.0–52.0)
Hemoglobin: 13 g/dL (ref 13.0–17.0)
MCH: 29.1 pg (ref 26.0–34.0)
MCHC: 34.6 g/dL (ref 30.0–36.0)
MCV: 84.1 fL (ref 80.0–100.0)
Platelets: 463 10*3/uL — ABNORMAL HIGH (ref 150–400)
RBC: 4.47 MIL/uL (ref 4.22–5.81)
RDW: 12.2 % (ref 11.5–15.5)
WBC: 12.9 10*3/uL — ABNORMAL HIGH (ref 4.0–10.5)
nRBC: 0 % (ref 0.0–0.2)

## 2021-11-13 LAB — IRON AND TIBC
Iron: 27 ug/dL — ABNORMAL LOW (ref 45–182)
Saturation Ratios: 11 % — ABNORMAL LOW (ref 17.9–39.5)
TIBC: 253 ug/dL (ref 250–450)
UIBC: 226 ug/dL

## 2021-11-13 LAB — C-REACTIVE PROTEIN: CRP: 3.9 mg/dL — ABNORMAL HIGH (ref <1.0)

## 2021-11-13 MED ORDER — ONDANSETRON HCL 4 MG PO TABS: 4.0000 mg | ORAL_TABLET | Freq: Three times a day (TID) | ORAL | 0 refills | Status: DC | PRN

## 2021-11-13 MED ORDER — CIPROFLOXACIN HCL 500 MG PO TABS: 500.0000 mg | ORAL_TABLET | Freq: Two times a day (BID) | ORAL | 0 refills | Status: DC

## 2021-11-13 MED ORDER — IOHEXOL 300 MG/ML  SOLN
100.0000 mL | Freq: Once | INTRAMUSCULAR | Status: AC | PRN
  Administered 2021-11-13: 100 mL via INTRAVENOUS

## 2021-11-13 MED ORDER — SODIUM CHLORIDE 0.9 % IV BOLUS
1000.0000 mL | Freq: Once | INTRAVENOUS | Status: AC
Start: 2021-11-13 — End: 2021-11-13
  Administered 2021-11-13: 1000 mL via INTRAVENOUS

## 2021-11-13 MED ORDER — ONDANSETRON HCL 4 MG/2ML IJ SOLN
4.0000 mg | Freq: Once | INTRAMUSCULAR | Status: AC
  Administered 2021-11-13: 4 mg via INTRAVENOUS
  Filled 2021-11-13: qty 2

## 2021-11-13 MED ORDER — SODIUM CHLORIDE 0.9 % IV BOLUS
1000.0000 mL | Freq: Once | INTRAVENOUS | Status: AC
  Administered 2021-11-13: 1000 mL via INTRAVENOUS

## 2021-11-13 NOTE — ED Notes (Signed)
Given soda and crackers for PO challenge  

## 2021-11-13 NOTE — ED Provider Notes (Signed)
[facility] EMERGENCY DEPT Provider Note   CSN: [id number] Arrival date & time: 11/13/21  1315     History  Chief Complaint  Patient presents with   Rectal Bleeding   Abdominal Pain   Nausea    Willie James is a 21 y.o. male with medical history significant for palpitations, headaches.  Patient presents to ED for evaluation of rectal bleeding.  Patient was initially seen on 10/20/2021 by PCP for concern of change in bowel habit.  At this time, the patient presented complaining of periumbilical discomfort, intermittently for the last 2 weeks.  The patient had associated symptoms at this time of abdominal discomfort that was worse at night, when standing/walking.  The patient noticed at this time harder stools.  At that time, PCP felt that patient abdominal pain and blood in stool was due to constipation and resulting straining causing hemorrhoids.  Patient was discharged home and advised to increase water consumption, start stool softeners and steroid suppository.  Patient had successful bowel movements with this regimen however returned to his PCP on 11/02/2021 with new complaint of discomfort more so in his low pelvis feeling of pressure, like he has to have a bowel movement.  Patient stated this time he had noticed the bleeding was worse and instead of being present on the toilet paper now he saw small blood clots in the toilet bowl.  At this office visit, the patient was noted to be tachycardic.  Patient had 3 time fecal occult studies that were positive as well as elevated CRP.  The patient was referred to gastroenterology at this time by his PCP.  Patient presents to ED today complaining of abdominal pain, nausea, vomiting.  Patient reports he had multiple sets of diarrhea with blood in them over the course of the last week.  Patient states he is unable to keep anything down.  Patient denies any fevers, bodyaches or chills at home.   Rectal Bleeding Associated symptoms:  abdominal pain and vomiting   Associated symptoms: no fever   Abdominal Pain Associated symptoms: diarrhea, hematochezia, nausea and vomiting   Associated symptoms: no chills and no fever        Home Medications Prior to Admission medications   Medication Sig Start Date End Date Taking? Authorizing Provider  ciprofloxacin (CIPRO) 500 MG tablet Take 1 tablet (500 mg total) by mouth 2 (two) times daily. 11/13/21  Yes Al Decant, PA-C  ondansetron (ZOFRAN) 4 MG tablet Take 1 tablet (4 mg total) by mouth every 8 (eight) hours as needed for nausea or vomiting. 11/13/21  Yes Al Decant, PA-C  ciclopirox (LOPROX) 0.77 % cream Apply topically 2 (two) times daily. 04/25/20   Kuneff, Renee A, DO  clobetasol cream (TEMOVATE) 0.05 % Apply 1 application topically 2 (two) times daily. 09/24/20   Janalyn Harder, MD  hydrocortisone (ANUSOL-HC) 25 MG suppository Place 1 suppository (25 mg total) rectally 2 (two) times daily. 10/20/21   Kuneff, Renee A, DO  ibuprofen (ADVIL) 200 MG tablet Take 200 mg by mouth every 6 (six) hours as needed.    [provider]  ketoconazole (NIZORAL) 2 % shampoo Apply one application by lathering over wet skin. Rinse after 5 minutes. Repeat once 1 week later. 04/25/20   Kuneff, Renee A, DO  omega-3 acid ethyl esters (LOVAZA) 1 g capsule Take 1 capsule by mouth at bedtime. 01/08/20   [provider]  sennosides-docusate sodium (SENOKOT-S) 8.6-50 MG tablet 2 tabs before bed on day 1, then  1 tab nightly 10/20/21   Kuneff, Renee A, DO  witch hazel-glycerin (TUCKS) pad Use after bowel movement 10/20/21   Kuneff, Renee A, DO      Allergies    Patient has no known allergies.    Review of Systems   Review of Systems  Constitutional:  Negative for chills and fever.  Gastrointestinal:  Positive for abdominal pain, blood in stool, diarrhea, hematochezia, nausea, rectal pain and vomiting.  Neurological:  Positive for weakness.    Physical Exam Updated  Vital Signs BP 129/80   Pulse (!) 110   Temp 98.4 F (36.9 C)   Resp (!) 22   Ht 5\' 3"  (1.6 m)   Wt 88.5 kg   SpO2 99%   BMI 34.54 kg/m  Physical Exam Vitals and nursing note reviewed.  Constitutional:      General: He is not in acute distress.    Appearance: He is well-developed. He is not ill-appearing, toxic-appearing or diaphoretic.  HENT:     Head: Normocephalic and atraumatic.     Nose: Nose normal. No congestion.     Mouth/Throat:     Mouth: Mucous membranes are moist.     Pharynx: Oropharynx is clear.  Eyes:     Extraocular Movements: Extraocular movements intact.     Pupils: Pupils are equal, round, and reactive to light.  Cardiovascular:     Rate and Rhythm: Regular rhythm. Tachycardia present.  Pulmonary:     Effort: Pulmonary effort is normal.     Breath sounds: Normal breath sounds. No wheezing.  Abdominal:     General: Abdomen is flat. Bowel sounds are normal. There is no distension.     Palpations: Abdomen is soft.     Tenderness: There is no abdominal tenderness. There is no right CVA tenderness or left CVA tenderness.  Musculoskeletal:     Cervical back: Normal range of motion and neck supple. No tenderness.  Skin:    General: Skin is warm and dry.     Capillary Refill: Capillary refill takes less than 2 seconds.     Coloration: Skin is pale.  Neurological:     Mental Status: He is alert and oriented to person, place, and time.     ED Results / Procedures / Treatments   Labs (all labs ordered are listed, but only abnormal results are displayed) Labs Reviewed  COMPREHENSIVE METABOLIC PANEL - Abnormal; Notable for the following components:      Result Value   CO2 21 (*)    All other components within normal limits  CBC - Abnormal; Notable for the following components:   WBC 12.9 (*)    HCT 37.6 (*)    Platelets 463 (*)    All other components within normal limits  C-REACTIVE PROTEIN - Abnormal; Notable for the following components:   CRP 3.9  (*)    All other components within normal limits  IRON AND TIBC - Abnormal; Notable for the following components:   Iron 27 (*)    Saturation Ratios 11 (*)    All other components within normal limits  OCCULT BLOOD X 1 CARD TO LAB, STOOL    EKG None  Radiology CT ABDOMEN PELVIS W CONTRAST  Result Date: 11/13/2021 CLINICAL DATA:  Nausea and vomiting with abdominal pain. EXAM: CT ABDOMEN AND PELVIS WITH CONTRAST TECHNIQUE: Multidetector CT imaging of the abdomen and pelvis was performed using the standard protocol following bolus administration of intravenous contrast. RADIATION DOSE REDUCTION: This exam was performed according  to the departmental dose-optimization program which includes automated exposure control, adjustment of the mA and/or kV according to patient size and/or use of iterative reconstruction technique. CONTRAST:  OMNIPAQUE IOHEXOL 300 MG/ML  SOLN COMPARISON:  Abdominal x-ray 10/20/2021 FINDINGS: Lower chest: No acute abnormality. Hepatobiliary: No focal liver abnormality is seen. No gallstones, gallbladder wall thickening, or biliary dilatation. Pancreas: Unremarkable. No pancreatic ductal dilatation or surrounding inflammatory changes. Spleen: Normal in size without focal abnormality. Adrenals/Urinary Tract: Adrenal glands are unremarkable. Kidneys are normal, without renal calculi, focal lesion, or hydronephrosis. Bladder is unremarkable. Stomach/Bowel: There is mild diffuse colonic wall thickening with submucosal enhancement. No dilated bowel loops are seen. No free air or pneumatosis. The appendix is within normal limits. Small bowel and stomach are within normal limits. Vascular/Lymphatic: No significant vascular findings are present. No enlarged abdominal or pelvic lymph nodes. Reproductive: Prostate is unremarkable. Other: There is a small fat containing umbilical hernia. No ascites. Musculoskeletal: No acute or significant osseous findings. IMPRESSION: 1. Diffuse colonic  wall thickening compatible with nonspecific colitis. Findings are favored as infectious/inflammatory. Electronically Signed   By: Darliss Cheney M.D.   On: 11/13/2021 15:29    Procedures Procedures   Medications Ordered in ED Medications  sodium chloride 0.9 % bolus 1,000 mL (0 mLs Intravenous Stopped 11/13/21 1600)  ondansetron (ZOFRAN) injection 4 mg (4 mg Intravenous Given 11/13/21 1442)  iohexol (OMNIPAQUE) 300 MG/ML solution 100 mL (100 mLs Intravenous Contrast Given 11/13/21 1515)  sodium chloride 0.9 % bolus 1,000 mL (0 mLs Intravenous Stopped 11/13/21 1746)    ED Course/ Medical Decision Making/ A&P                           Medical Decision Making Amount and/or Complexity of Data Reviewed Labs: ordered. Radiology: ordered.  Risk Prescription drug management.   21 year old male presents to ED for evaluation.  Please see HPI for further details.  On examination, the patient is tachycardic to 130 bpm, afebrile.  Patient lung sounds clear bilaterally, not hypoxic.  Patient abdomen is soft and compressible in all 4 quadrants, no tenderness elicited on exam.  Patient worked up utilizing the following labs and imaging studies interpreted by me personally: - C-reactive protein elevated at 3.9 - CBC has elevated leukocytosis of 12.9 - Iron and TIBC studies showed decreased iron to 27 - CMP unremarkable - Hemoccult card not collected.  Patient has had 4 positive Hemoccult studies in the last 3 days. - CT abdomen pelvis shows diffuse colonic wall thickening consistent with colitis.  I agree with radiologist to rotation.  The patient be started on 4 days of ciprofloxacin 500 mg twice daily.  Patient was initially tachycardic upon arrival.  The patient was given 2 L of fluid, Zofran.  Patient's pulse rate normalized at this time however when he got up to go to the bathroom his pulse rate elevated.  At this time, the patient is stable for discharge.  The patient will be advised to  follow-up with his GI doctor and PCP for further management.  The patient and his father have both been given return precautions and they voiced understanding with these.  The patient has had all his questions answered to his satisfaction.  Patient stable for discharge.   Final Clinical Impression(s) / ED Diagnoses Final diagnoses:  Nausea vomiting and diarrhea    Rx / DC Orders ED Discharge Orders          Ordered  ciprofloxacin (CIPRO) 500 MG tablet  2 times daily        11/13/21 1846    ondansetron (ZOFRAN) 4 MG tablet  Every 8 hours PRN        11/13/21 1846              Al Decant, PA-C 11/13/21 1851    Cheryll Cockayne, MD 11/21/21 [phone]

## 2021-11-13 NOTE — ED Triage Notes (Addendum)
Pt c/o abdominal pain, nausea, vomiting for over a month. States he has had intermittent rectal bleeding for about the same time. Reports he has lost about 20 pounds since symptoms occurred.

## 2021-11-19 ENCOUNTER — Encounter: Payer: Self-pay | Admitting: Family Medicine

## 2021-11-20 NOTE — Telephone Encounter (Signed)
Please advise 

## 2021-11-25 NOTE — Telephone Encounter (Signed)
Ok to draft letter for Willie James to excuse him from [employer] December 21, 2021 due to a serious medical condition, in which is currently undergoing medical treatment by PCP and GI specialist.

## 2021-11-30 ENCOUNTER — Telehealth: Payer: Self-pay | Admitting: Family Medicine

## 2021-11-30 MED ORDER — ONDANSETRON HCL 4 MG PO TABS: 4.0000 mg | ORAL_TABLET | Freq: Three times a day (TID) | ORAL | 0 refills | Status: DC | PRN

## 2021-11-30 NOTE — Telephone Encounter (Signed)
Pt informed

## 2021-11-30 NOTE — Telephone Encounter (Signed)
Patient mother called stating that patient has nausea, vomiting and diarrhea. Patient has an appointment scheduled with [facility] on 7/19. Mother wants to know if patient can have a medication sent to the pharmacy for nausea so that patient can get some type of relief.  Or does patient need to be seen prior to appointment with GI. It is noted that patient was seen at the ED on 6/23 for the same symptoms and was prescribed Cipro and Zofran.

## 2021-11-30 NOTE — Telephone Encounter (Signed)
Please advise 

## 2021-11-30 NOTE — Telephone Encounter (Signed)
Refilled Zofran for him.

## 2021-12-09 ENCOUNTER — Other Ambulatory Visit (INDEPENDENT_AMBULATORY_CARE_PROVIDER_SITE_OTHER): Payer: 59

## 2021-12-09 ENCOUNTER — Encounter: Payer: Self-pay | Admitting: Gastroenterology

## 2021-12-09 ENCOUNTER — Ambulatory Visit: Payer: 59 | Admitting: Gastroenterology

## 2021-12-09 VITALS — BP 106/80 | HR 139 | Ht 63.0 in | Wt 182.2 lb

## 2021-12-09 DIAGNOSIS — R231 Pallor: Secondary | ICD-10-CM

## 2021-12-09 DIAGNOSIS — R634 Abnormal weight loss: Secondary | ICD-10-CM | POA: Diagnosis not present

## 2021-12-09 DIAGNOSIS — R194 Change in bowel habit: Secondary | ICD-10-CM

## 2021-12-09 DIAGNOSIS — R1084 Generalized abdominal pain: Secondary | ICD-10-CM | POA: Diagnosis not present

## 2021-12-09 DIAGNOSIS — K921 Melena: Secondary | ICD-10-CM

## 2021-12-09 DIAGNOSIS — R112 Nausea with vomiting, unspecified: Secondary | ICD-10-CM

## 2021-12-09 DIAGNOSIS — R197 Diarrhea, unspecified: Secondary | ICD-10-CM

## 2021-12-09 LAB — CBC
HCT: 29.4 % — ABNORMAL LOW (ref 39.0–52.0)
Hemoglobin: 9.8 g/dL — ABNORMAL LOW (ref 13.0–17.0)
MCHC: 33.2 g/dL (ref 30.0–36.0)
MCV: 85.1 fl (ref 78.0–100.0)
Platelets: 589 10*3/uL — ABNORMAL HIGH (ref 150.0–400.0)
RBC: 3.46 Mil/uL — ABNORMAL LOW (ref 4.22–5.81)
RDW: 13.7 % (ref 11.5–15.5)
WBC: 13.5 10*3/uL — ABNORMAL HIGH (ref 4.0–10.5)

## 2021-12-09 LAB — IBC + FERRITIN
Ferritin: 25.6 ng/mL (ref 22.0–322.0)
Iron: 11 ug/dL — ABNORMAL LOW (ref 42–165)
Saturation Ratios: 3.6 % — ABNORMAL LOW (ref 20.0–50.0)
TIBC: 303.8 ug/dL (ref 250.0–450.0)
Transferrin: 217 mg/dL (ref 212.0–360.0)

## 2021-12-09 LAB — VITAMIN B12: Vitamin B-12: 773 pg/mL (ref 211–911)

## 2021-12-09 LAB — FOLATE: Folate: 8.2 ng/mL (ref >5.9)

## 2021-12-09 NOTE — Progress Notes (Signed)
Chief Complaint: Rectal bleeding, nausea/vomiting, abdominal pain  Referring Provider:     [name], DO    HPI:     Caprice Wasko is a 21 y.o. male referred to the [facility] for evaluation of nausea/vomiting and rectal bleeding.  Started as generalized abdominal pain and BRBPR 2+ months ago. No diarrhea initially.  Was initially seen by [facility] for this issue on 10/20/2021. - Abdominal x-ray: Moderate stool in ascending/transverse colon - Started stool softener, steroid suppository, recommended increased hydration with subsequent bowel movements and improvement in abdominal pain  Follow-up with PCM on 11/02/2021 with lower abdominal pain/pelvic pain.  Was still having hematochezia and stools transitioning to loose - Normal CBC, TSH, iron panel - FOBT positive x3 - CRP elevated at 13.2 - Placed referral to GI  Was then seen in the ER with abdominal pain, nausea/vomiting, BRBPR on 11/13/2021.  Was tachycardic at 130 bpm, but exam otherwise benign. - WBC 12.9, H/H 13/37.6, PLT 463 - Iron 27, TIBC 253, sat 11% - Normal CMP - CRP 3.9 - CT A/P: Diffuse colonic wall thickening.  Normal stomach, small bowel.  No lymphadenopathy - Discharged with ciprofloxacin 500 mg bid x4 days  Today, he states diarrhea still present. Having multiple BM/day now.  Can have up to 3-4 BM per hour at times.  Can have 2-3 nocturnal BM some nights, and other nights no sxs. Still w/ nausea and episodic non-bloody emesis, but improved with Zofran.   Has lost 34# since sxs onset. Decreased PO intake due to post prandial diarrhea. No arthralgias, skin issues, eye issues.   No prior similar sxs. No prior EGD, Colonoscopy.   No known family history of CRC, GI malignancy, liver disease, pancreatic disease, or IBD.     Past Medical History:  Diagnosis Date   Frequent headaches    Left ventricular hypertrophy by electrocardiogram 10/28/2015   echo after was normal.     Palpitations 10/30/2015   pt reports palpitaitons and chest discomfort. EKG (reported LVH) and then NORMAL echo completed.      Past Surgical History:  Procedure Laterality Date   NO PAST SURGERIES     Family History  Problem Relation Age of Onset   Hyperlipidemia Father    Arthritis Maternal Grandmother    Diabetes Maternal Grandmother    Hyperlipidemia Maternal Grandmother    Hypertension Maternal Grandmother    Hypertension Maternal Grandfather    Hyperlipidemia Maternal Grandfather    Prostate cancer Maternal Grandfather    Cervical cancer Paternal Grandmother    Hypertension Paternal Grandfather    Heart disease Paternal Grandfather    Colon cancer Neg Hx    Esophageal cancer Neg Hx    Social History   Tobacco Use   Smoking status: Never   Smokeless tobacco: Never  Vaping Use   Vaping Use: Never used  Substance Use Topics   Alcohol use: Never   Drug use: Never   Current Outpatient Medications  Medication Sig Dispense Refill   ondansetron (ZOFRAN) 4 MG tablet Take 1 tablet (4 mg total) by mouth every 8 (eight) hours as needed for nausea or vomiting. 60 tablet 0   ciclopirox (LOPROX) 0.77 % cream Apply topically 2 (two) times daily. (Patient not taking: Reported on 12/09/2021) 30 g 0   clobetasol cream (TEMOVATE) 0.05 % Apply 1 application topically 2 (two) times daily. (Patient not taking: Reported on 12/09/2021) 45 g 1  hydrocortisone (ANUSOL-HC) 25 MG suppository Place 1 suppository (25 mg total) rectally 2 (two) times daily. (Patient not taking: Reported on 12/09/2021) 12 suppository 0   ibuprofen (ADVIL) 200 MG tablet Take 200 mg by mouth every 6 (six) hours as needed. (Patient not taking: Reported on 12/09/2021)     ketoconazole (NIZORAL) 2 % shampoo Apply one application by lathering over wet skin. Rinse after 5 minutes. Repeat once 1 week later. (Patient not taking: Reported on 12/09/2021) 30 mL 0   omega-3 acid ethyl esters (LOVAZA) 1 g capsule Take 1 capsule by  mouth at bedtime. (Patient not taking: Reported on 12/09/2021)     sennosides-docusate sodium (SENOKOT-S) 8.6-50 MG tablet 2 tabs before bed on day 1, then 1 tab nightly (Patient not taking: Reported on 12/09/2021) 60 tablet 1   witch hazel-glycerin (TUCKS) pad Use after bowel movement (Patient not taking: Reported on 12/09/2021) 40 each 0   No current facility-administered medications for this visit.   No Known Allergies   Review of Systems: All systems reviewed and negative except where noted in HPI.     Physical Exam:    Wt Readings from Last 3 Encounters:  12/09/21 182 lb 4 oz (82.7 kg)  11/13/21 195 lb (88.5 kg)  11/02/21 206 lb 3.2 oz (93.5 kg)    BP 106/80   Pulse (!) 139   Ht 5\' 3"  (1.6 m)   Wt 182 lb 4 oz (82.7 kg)   BMI 32.28 kg/m  Constitutional:  Pleasant, in no acute distress. Psychiatric: Normal mood and affect. Behavior is normal. Cardiovascular: Normal rate, regular rhythm. No edema Pulmonary/chest: Effort normal and breath sounds normal. No wheezing, rales or rhonchi. Abdominal: Soft, nondistended, nontender. Bowel sounds active throughout. There are no masses palpable. No hepatomegaly. Neurological: Alert and oriented to person place and time. Skin: Pale appearing.  Skin is warm and dry. No rashes noted.   ASSESSMENT AND PLAN;   1) Hematochezia 2) Change in bowel habits 3) Diarrhea 4) Generalized abdominal pain 5) Nausea/Vomiting 6) Leukocytosis 7) Pale complexion 8) Weight loss  - Ok to resume Zofran prn - CBC, iron, B12, folate - Check fecal calprotectin - Check GI PCR panel - Expedited EGD/colonoscopy due to elevated suspicion for IBD, continued symptomatology - Holding off on starting new medication until stool studies and expedited endoscopic evaluation - Continue p.o. intake as tolerated maintain adequate hydration  The indications, risks, and benefits of EGD and colonoscopy were explained to the patient in detail. Risks include but are not  limited to bleeding, perforation, adverse reaction to medications, and cardiopulmonary compromise. Sequelae include but are not limited to the possibility of surgery, hospitalization, and mortality. The patient verbalized understanding and wished to proceed. All questions answered, referred to scheduler and bowel prep ordered. Further recommendations pending results of the exam.    Hesper Venturella, DO, FACG  12/09/2021, 3:41 PM   Kuneff, Renee A, DO

## 2021-12-09 NOTE — Patient Instructions (Addendum)
If you are age 23 or younger, your body mass index should be between 19-25. Your Body mass index is 32.28 kg/m. If this is out of the aformentioned range listed, please consider follow up with your Primary Care Provider.   ________________________________________________________  The [facility] providers would like to encourage you to use [facility] to communicate with providers for non-urgent requests or questions.  Due to long hold times on the telephone, sending your provider a message by [facility] may be a faster and more efficient way to get a response.  Please allow 48 business hours for a response.  Please remember that this is for non-urgent requests.  _______________________________________________________  Due to recent changes in healthcare laws, you may see the results of your imaging and laboratory studies on MyChart before your provider has had a chance to review them.  We understand that in some cases there may be results that are confusing or concerning to you. Not all laboratory results come back in the same time frame and the provider may be waiting for multiple results in order to interpret others.  Please give [place] 48 hours in order for your provider to thoroughly review all the results before contacting the office for clarification of your results.    Your provider has requested that you go to the basement level for lab work before leaving today. Press "B" on the elevator. The lab is located at the first door on the left as you exit the elevator.   You have been scheduled for an endoscopy and colonoscopy. Please follow the written instructions given to you at your visit today. Please pick up your prep supplies at the pharmacy within the next 1-3 days. If you use inhalers (even only as needed), please bring them with you on the day of your procedure.   We have provided you the sample for your prep - Clenpiq  Thank you for choosing me and [facility].  Vito Cirigliano,  D.O.

## 2021-12-10 ENCOUNTER — Encounter: Payer: Self-pay | Admitting: Gastroenterology

## 2021-12-10 ENCOUNTER — Other Ambulatory Visit: Payer: Self-pay | Admitting: Pharmacy Technician

## 2021-12-10 ENCOUNTER — Other Ambulatory Visit: Payer: Self-pay

## 2021-12-10 ENCOUNTER — Telehealth: Payer: Self-pay | Admitting: Pharmacy Technician

## 2021-12-10 DIAGNOSIS — K921 Melena: Secondary | ICD-10-CM | POA: Insufficient documentation

## 2021-12-10 DIAGNOSIS — D509 Iron deficiency anemia, unspecified: Secondary | ICD-10-CM | POA: Insufficient documentation

## 2021-12-10 DIAGNOSIS — D500 Iron deficiency anemia secondary to blood loss (chronic): Secondary | ICD-10-CM

## 2021-12-10 NOTE — Telephone Encounter (Signed)
Dr. Barron Alvine,  Duncan Dull is a non-preferred medication for [employer] and will likely be denied due to patient has not tried and or failed step therapy. Venofer Infed Ferrlecit Would you like to try venofer??  Selena Batten

## 2021-12-14 ENCOUNTER — Other Ambulatory Visit: Payer: 59

## 2021-12-14 DIAGNOSIS — R1084 Generalized abdominal pain: Secondary | ICD-10-CM

## 2021-12-14 DIAGNOSIS — R112 Nausea with vomiting, unspecified: Secondary | ICD-10-CM

## 2021-12-14 DIAGNOSIS — K921 Melena: Secondary | ICD-10-CM

## 2021-12-14 DIAGNOSIS — R634 Abnormal weight loss: Secondary | ICD-10-CM

## 2021-12-15 ENCOUNTER — Encounter (HOSPITAL_BASED_OUTPATIENT_CLINIC_OR_DEPARTMENT_OTHER): Payer: Self-pay | Admitting: Obstetrics and Gynecology

## 2021-12-15 ENCOUNTER — Ambulatory Visit: Payer: 59

## 2021-12-15 ENCOUNTER — Other Ambulatory Visit: Payer: Self-pay

## 2021-12-15 ENCOUNTER — Emergency Department (HOSPITAL_BASED_OUTPATIENT_CLINIC_OR_DEPARTMENT_OTHER): Payer: 59

## 2021-12-15 ENCOUNTER — Emergency Department (HOSPITAL_BASED_OUTPATIENT_CLINIC_OR_DEPARTMENT_OTHER): Payer: 59 | Admitting: Radiology

## 2021-12-15 ENCOUNTER — Inpatient Hospital Stay (HOSPITAL_BASED_OUTPATIENT_CLINIC_OR_DEPARTMENT_OTHER)
Admission: EM | Admit: 2021-12-15 | Discharge: 2021-12-19 | DRG: 386 | Disposition: A | Discharge status: Home / Self Care | Payer: 59 | Attending: Internal Medicine | Admitting: Internal Medicine

## 2021-12-15 VITALS — BP 109/73 | HR 137 | Temp 100.5°F | Resp 18 | Ht 63.0 in | Wt 180.0 lb

## 2021-12-15 DIAGNOSIS — K2980 Duodenitis without bleeding: Secondary | ICD-10-CM | POA: Diagnosis present

## 2021-12-15 DIAGNOSIS — K279 Peptic ulcer, site unspecified, unspecified as acute or chronic, without hemorrhage or perforation: Secondary | ICD-10-CM | POA: Diagnosis not present

## 2021-12-15 DIAGNOSIS — R634 Abnormal weight loss: Secondary | ICD-10-CM | POA: Diagnosis not present

## 2021-12-15 DIAGNOSIS — R1084 Generalized abdominal pain: Secondary | ICD-10-CM

## 2021-12-15 DIAGNOSIS — R933 Abnormal findings on diagnostic imaging of other parts of digestive tract: Secondary | ICD-10-CM | POA: Diagnosis not present

## 2021-12-15 DIAGNOSIS — K5100 Ulcerative (chronic) pancolitis without complications: Secondary | ICD-10-CM | POA: Diagnosis present

## 2021-12-15 DIAGNOSIS — R7982 Elevated C-reactive protein (CRP): Secondary | ICD-10-CM | POA: Diagnosis present

## 2021-12-15 DIAGNOSIS — R112 Nausea with vomiting, unspecified: Secondary | ICD-10-CM | POA: Diagnosis not present

## 2021-12-15 DIAGNOSIS — Z833 Family history of diabetes mellitus: Secondary | ICD-10-CM | POA: Diagnosis not present

## 2021-12-15 DIAGNOSIS — D62 Acute posthemorrhagic anemia: Secondary | ICD-10-CM | POA: Diagnosis present

## 2021-12-15 DIAGNOSIS — Z6831 Body mass index (BMI) 31.0-31.9, adult: Secondary | ICD-10-CM | POA: Diagnosis not present

## 2021-12-15 DIAGNOSIS — K529 Noninfective gastroenteritis and colitis, unspecified: Secondary | ICD-10-CM

## 2021-12-15 DIAGNOSIS — E669 Obesity, unspecified: Secondary | ICD-10-CM | POA: Diagnosis present

## 2021-12-15 DIAGNOSIS — K5190 Ulcerative colitis, unspecified, without complications: Secondary | ICD-10-CM | POA: Diagnosis present

## 2021-12-15 DIAGNOSIS — K2100 Gastro-esophageal reflux disease with esophagitis, without bleeding: Secondary | ICD-10-CM | POA: Diagnosis present

## 2021-12-15 DIAGNOSIS — K209 Esophagitis, unspecified without bleeding: Secondary | ICD-10-CM

## 2021-12-15 DIAGNOSIS — K2990 Gastroduodenitis, unspecified, without bleeding: Secondary | ICD-10-CM | POA: Diagnosis present

## 2021-12-15 DIAGNOSIS — D649 Anemia, unspecified: Principal | ICD-10-CM

## 2021-12-15 DIAGNOSIS — D500 Iron deficiency anemia secondary to blood loss (chronic): Secondary | ICD-10-CM | POA: Diagnosis not present

## 2021-12-15 DIAGNOSIS — E876 Hypokalemia: Secondary | ICD-10-CM | POA: Diagnosis present

## 2021-12-15 DIAGNOSIS — Z20822 Contact with and (suspected) exposure to covid-19: Secondary | ICD-10-CM | POA: Diagnosis present

## 2021-12-15 DIAGNOSIS — Z83438 Family history of other disorder of lipoprotein metabolism and other lipidemia: Secondary | ICD-10-CM | POA: Diagnosis not present

## 2021-12-15 DIAGNOSIS — Z8042 Family history of malignant neoplasm of prostate: Secondary | ICD-10-CM

## 2021-12-15 DIAGNOSIS — R6510 Systemic inflammatory response syndrome (SIRS) of non-infectious origin without acute organ dysfunction: Secondary | ICD-10-CM

## 2021-12-15 DIAGNOSIS — K921 Melena: Secondary | ICD-10-CM | POA: Diagnosis not present

## 2021-12-15 DIAGNOSIS — E872 Acidosis, unspecified: Secondary | ICD-10-CM | POA: Diagnosis present

## 2021-12-15 DIAGNOSIS — K2970 Gastritis, unspecified, without bleeding: Secondary | ICD-10-CM

## 2021-12-15 DIAGNOSIS — Z8261 Family history of arthritis: Secondary | ICD-10-CM | POA: Diagnosis not present

## 2021-12-15 DIAGNOSIS — K625 Hemorrhage of anus and rectum: Secondary | ICD-10-CM | POA: Diagnosis not present

## 2021-12-15 DIAGNOSIS — K51011 Ulcerative (chronic) pancolitis with rectal bleeding: Secondary | ICD-10-CM | POA: Diagnosis not present

## 2021-12-15 DIAGNOSIS — Z8249 Family history of ischemic heart disease and other diseases of the circulatory system: Secondary | ICD-10-CM

## 2021-12-15 HISTORY — DX: Systemic inflammatory response syndrome (sirs) of non-infectious origin without acute organ dysfunction: R65.10

## 2021-12-15 LAB — RESP PANEL BY RT-PCR (FLU A&B, COVID) ARPGX2
Influenza A by PCR: NEGATIVE
Influenza B by PCR: NEGATIVE
SARS Coronavirus 2 by RT PCR: NEGATIVE

## 2021-12-15 LAB — COMPREHENSIVE METABOLIC PANEL
ALT: 8 U/L (ref 0–44)
AST: 12 U/L — ABNORMAL LOW (ref 15–41)
Albumin: 3.5 g/dL (ref 3.5–5.0)
Alkaline Phosphatase: 70 U/L (ref 38–126)
Anion gap: 14 (ref 5–15)
BUN: 8 mg/dL (ref 6–20)
CO2: 26 mmol/L (ref 22–32)
Calcium: 9.1 mg/dL (ref 8.9–10.3)
Chloride: 95 mmol/L — ABNORMAL LOW (ref 98–111)
Creatinine, Ser: 0.87 mg/dL (ref 0.61–1.24)
GFR, Estimated: >60 mL/min (ref >60)
Glucose, Bld: 109 mg/dL — ABNORMAL HIGH (ref 70–99)
Potassium: 3.1 mmol/L — ABNORMAL LOW (ref 3.5–5.1)
Sodium: 135 mmol/L (ref 135–145)
Total Bilirubin: 0.3 mg/dL (ref 0.3–1.2)
Total Protein: 7.1 g/dL (ref 6.5–8.1)

## 2021-12-15 LAB — URINALYSIS, ROUTINE W REFLEX MICROSCOPIC
Bilirubin Urine: NEGATIVE
Glucose, UA: NEGATIVE mg/dL
Hgb urine dipstick: NEGATIVE
Ketones, ur: NEGATIVE mg/dL
Leukocytes,Ua: NEGATIVE
Nitrite: NEGATIVE
Protein, ur: 30 mg/dL — AB
Specific Gravity, Urine: 1.032 — ABNORMAL HIGH (ref 1.005–1.030)
pH: 6 (ref 5.0–8.0)

## 2021-12-15 LAB — LIPASE, BLOOD: Lipase: 17 U/L (ref 11–51)

## 2021-12-15 LAB — CBC WITH DIFFERENTIAL/PLATELET
Abs Immature Granulocytes: 0.07 10*3/uL (ref 0.00–0.07)
Basophils Absolute: 0.1 10*3/uL (ref 0.0–0.1)
Basophils Relative: 0 %
Eosinophils Absolute: 0.2 10*3/uL (ref 0.0–0.5)
Eosinophils Relative: 2 %
HCT: 25 % — ABNORMAL LOW (ref 39.0–52.0)
Hemoglobin: 8.2 g/dL — ABNORMAL LOW (ref 13.0–17.0)
Immature Granulocytes: 1 %
Lymphocytes Relative: 21 %
Lymphs Abs: 2.6 10*3/uL (ref 0.7–4.0)
MCH: 27.7 pg (ref 26.0–34.0)
MCHC: 32.8 g/dL (ref 30.0–36.0)
MCV: 84.5 fL (ref 80.0–100.0)
Monocytes Absolute: 1.5 10*3/uL — ABNORMAL HIGH (ref 0.1–1.0)
Monocytes Relative: 12 %
Neutro Abs: 8 10*3/uL — ABNORMAL HIGH (ref 1.7–7.7)
Neutrophils Relative %: 64 %
Platelets: 657 10*3/uL — ABNORMAL HIGH (ref 150–400)
RBC: 2.96 MIL/uL — ABNORMAL LOW (ref 4.22–5.81)
RDW: 12.7 % (ref 11.5–15.5)
WBC: 12.5 10*3/uL — ABNORMAL HIGH (ref 4.0–10.5)
nRBC: 0 % (ref 0.0–0.2)

## 2021-12-15 LAB — LACTIC ACID, PLASMA
Lactic Acid, Venous: 2.7 mmol/L (ref 0.5–1.9)
Lactic Acid, Venous: 2.3 mmol/L (ref 0.5–1.9)

## 2021-12-15 LAB — PROTIME-INR
INR: 1.2 (ref 0.8–1.2)
Prothrombin Time: 14.8 seconds (ref 11.4–15.2)

## 2021-12-15 MED ORDER — SODIUM CHLORIDE 0.9 % IV SOLN
200.0000 mg | Freq: Once | INTRAVENOUS | Status: DC
  Filled 2021-12-15: qty 10

## 2021-12-15 MED ORDER — LACTATED RINGERS IV BOLUS
1000.0000 mL | Freq: Once | INTRAVENOUS | Status: AC
Start: 2021-12-15 — End: 2021-12-15
  Administered 2021-12-15: 1000 mL via INTRAVENOUS

## 2021-12-15 MED ORDER — LACTATED RINGERS IV SOLN: INTRAVENOUS | Status: AC

## 2021-12-15 MED ORDER — ACETAMINOPHEN 325 MG PO TABS
650.0000 mg | ORAL_TABLET | Freq: Once | ORAL | Status: AC
  Administered 2021-12-15: 650 mg via ORAL
  Filled 2021-12-15: qty 2

## 2021-12-15 MED ORDER — SODIUM CHLORIDE 0.9 % IV SOLN
1.0000 g | Freq: Once | INTRAVENOUS | Status: AC
  Administered 2021-12-15: 1 g via INTRAVENOUS
  Filled 2021-12-15: qty 10

## 2021-12-15 MED ORDER — IOHEXOL 300 MG/ML  SOLN
100.0000 mL | Freq: Once | INTRAMUSCULAR | Status: AC | PRN
  Administered 2021-12-15: 85 mL via INTRAVENOUS

## 2021-12-15 MED ORDER — ONDANSETRON HCL 4 MG/2ML IJ SOLN
4.0000 mg | Freq: Four times a day (QID) | INTRAMUSCULAR | Status: DC | PRN
  Administered 2021-12-16: 4 mg via INTRAVENOUS
  Filled 2021-12-15: qty 2

## 2021-12-15 MED ORDER — POTASSIUM CHLORIDE CRYS ER 20 MEQ PO TBCR
40.0000 meq | EXTENDED_RELEASE_TABLET | Freq: Once | ORAL | Status: AC
  Administered 2021-12-15: 40 meq via ORAL
  Filled 2021-12-15: qty 2

## 2021-12-15 MED ORDER — ACETAMINOPHEN 325 MG PO TABS: 650.0000 mg | ORAL_TABLET | Freq: Four times a day (QID) | ORAL | Status: DC | PRN

## 2021-12-15 MED ORDER — ONDANSETRON HCL 4 MG PO TABS: 4.0000 mg | ORAL_TABLET | Freq: Four times a day (QID) | ORAL | Status: DC | PRN

## 2021-12-15 MED ORDER — SODIUM CHLORIDE 0.9 % IV SOLN
2.0000 g | INTRAVENOUS | Status: DC
  Administered 2021-12-16: 2 g via INTRAVENOUS
  Filled 2021-12-15: qty 20

## 2021-12-15 MED ORDER — METRONIDAZOLE 500 MG/100ML IV SOLN
500.0000 mg | Freq: Once | INTRAVENOUS | Status: AC
  Administered 2021-12-15: 500 mg via INTRAVENOUS
  Filled 2021-12-15: qty 100

## 2021-12-15 MED ORDER — ACETAMINOPHEN 650 MG RE SUPP: 650.0000 mg | Freq: Four times a day (QID) | RECTAL | Status: DC | PRN

## 2021-12-15 MED ORDER — ONDANSETRON HCL 4 MG/2ML IJ SOLN
4.0000 mg | Freq: Once | INTRAMUSCULAR | Status: AC
  Administered 2021-12-15: 4 mg via INTRAVENOUS
  Filled 2021-12-15: qty 2

## 2021-12-15 MED ORDER — METRONIDAZOLE 500 MG/100ML IV SOLN
500.0000 mg | Freq: Two times a day (BID) | INTRAVENOUS | Status: DC
  Administered 2021-12-16 – 2021-12-17 (×3): 500 mg via INTRAVENOUS
  Filled 2021-12-15 (×3): qty 100

## 2021-12-15 NOTE — ED Provider Notes (Signed)
[facility] EMERGENCY DEPT Provider Note   CSN: [id number] Arrival date & time: 12/15/21  1436     History  Chief Complaint  Patient presents with   Abnormal Lab   Fever    Willie James is a 21 y.o. male.  Patient as above with significant medical history as below, including ongoing hematochezia followed with [facility], IDA who presents to the ED with complaint of fatigue, pallor, elevated HR, fever. Pt arrived to infusion clinic this AM for iron infusion, on arrival he was febrile, tachy, pale; sent to ED for eval. Pt reports his symptoms have been roughly unchanged x2 mos. Only change was developing a cough in the past 1.5 weeks. He still has ongoing hematochezia and intermittent abd pain, intermittent nausea and vomiting, poor PO intake 2/2 nausea. No urine changes. No cp or dib, no palpitations or heart fluttering sensation. He is scheduled for c/scopy 8/7 w/ [facility]     Past Medical History:  Diagnosis Date   Frequent headaches    Left ventricular hypertrophy by electrocardiogram 10/28/2015   echo after was normal.    Palpitations 10/30/2015   pt reports palpitaitons and chest discomfort. EKG (reported LVH) and then NORMAL echo completed.     Past Surgical History:  Procedure Laterality Date   NO PAST SURGERIES       The history is provided by the patient and a parent. No language interpreter was used.  Abnormal Lab Fever Associated symptoms: nausea and vomiting   Associated symptoms: no chest pain, no chills, no confusion, no cough, no headaches and no rash        Home Medications Prior to Admission medications   Medication Sig Start Date End Date Taking? Authorizing Provider  ciclopirox (LOPROX) 0.77 % cream Apply topically 2 (two) times daily. Patient not taking: Reported on 12/09/2021 04/25/20   Felix Pacini A, DO  clobetasol cream (TEMOVATE) 0.05 % Apply 1 application topically 2 (two) times daily. Patient not taking: Reported on 12/09/2021 09/24/20    Janalyn Harder, MD  hydrocortisone (ANUSOL-HC) 25 MG suppository Place 1 suppository (25 mg total) rectally 2 (two) times daily. Patient not taking: Reported on 12/09/2021 10/20/21   Felix Pacini A, DO  ketoconazole (NIZORAL) 2 % shampoo Apply one application by lathering over wet skin. Rinse after 5 minutes. Repeat once 1 week later. Patient not taking: Reported on 12/09/2021 04/25/20   Felix Pacini A, DO  omega-3 acid ethyl esters (LOVAZA) 1 g capsule Take 1 capsule by mouth at bedtime. Patient not taking: Reported on 12/09/2021 01/08/20   [provider]  ondansetron (ZOFRAN) 4 MG tablet Take 1 tablet (4 mg total) by mouth every 8 (eight) hours as needed for nausea or vomiting. Patient not taking: Reported on 12/15/2021 11/30/21   Felix Pacini A, DO  sennosides-docusate sodium (SENOKOT-S) 8.6-50 MG tablet 2 tabs before bed on day 1, then 1 tab nightly Patient not taking: Reported on 12/09/2021 10/20/21   Felix Pacini A, DO  witch hazel-glycerin (TUCKS) pad Use after bowel movement Patient not taking: Reported on 12/09/2021 10/20/21   Felix Pacini A, DO      Allergies    Patient has no known allergies.    Review of Systems   Review of Systems  Constitutional:  Positive for appetite change, fatigue and fever. Negative for chills.  HENT:  Negative for facial swelling and trouble swallowing.   Eyes:  Negative for photophobia and visual disturbance.  Respiratory:  Negative for cough and shortness of breath.  Cardiovascular:  Negative for chest pain and palpitations.  Gastrointestinal:  Positive for abdominal pain, nausea and vomiting.  Endocrine: Negative for polydipsia and polyuria.  Genitourinary:  Negative for difficulty urinating and hematuria.  Musculoskeletal:  Negative for gait problem and joint swelling.  Skin:  Negative for pallor and rash.  Neurological:  Negative for syncope and headaches.  Psychiatric/Behavioral:  Negative for agitation and confusion.     Physical  Exam Updated Vital Signs BP 130/84   Pulse (!) 120   Temp 98.5 F (36.9 C) (Oral)   Resp 12   Ht 5\' 3"  (1.6 m)   Wt 80.7 kg   SpO2 100%   BMI 31.53 kg/m  Physical Exam Vitals and nursing note reviewed.  Constitutional:      General: He is not in acute distress.    Appearance: He is well-developed. He is not diaphoretic.     Comments: pale  HENT:     Head: Normocephalic and atraumatic.     Right Ear: External ear normal.     Left Ear: External ear normal.     Mouth/Throat:     Mouth: Mucous membranes are moist.  Eyes:     General: No scleral icterus.    Comments: Conjunctival pallor  Cardiovascular:     Rate and Rhythm: Regular rhythm. Tachycardia present.     Pulses: Normal pulses.     Heart sounds: Normal heart sounds.  Pulmonary:     Effort: Pulmonary effort is normal. No tachypnea, accessory muscle usage or respiratory distress.     Breath sounds: Normal breath sounds. No decreased breath sounds or wheezing.  Abdominal:     General: Abdomen is flat.     Palpations: Abdomen is soft.     Tenderness: There is no abdominal tenderness. There is no guarding or rebound.  Musculoskeletal:        General: Normal range of motion.     Cervical back: Full passive range of motion without pain and normal range of motion.     Right lower leg: No edema.     Left lower leg: No edema.  Skin:    General: Skin is warm and dry.     Capillary Refill: Capillary refill takes less than 2 seconds.     Coloration: Skin is pale.  Neurological:     Mental Status: He is alert and oriented to person, place, and time.     GCS: GCS eye subscore is 4. GCS verbal subscore is 5. GCS motor subscore is 6.  Psychiatric:        Mood and Affect: Mood normal.        Behavior: Behavior normal.     ED Results / Procedures / Treatments   Labs (all labs ordered are listed, but only abnormal results are displayed) Labs Reviewed  COMPREHENSIVE METABOLIC PANEL - Abnormal; Notable for the following  components:      Result Value   Potassium 3.1 (*)    Chloride 95 (*)    Glucose, Bld 109 (*)    AST 12 (*)    All other components within normal limits  LACTIC ACID, PLASMA - Abnormal; Notable for the following components:   Lactic Acid, Venous 2.7 (*)    All other components within normal limits  LACTIC ACID, PLASMA - Abnormal; Notable for the following components:   Lactic Acid, Venous 2.3 (*)    All other components within normal limits  CBC WITH DIFFERENTIAL/PLATELET - Abnormal; Notable for the following components:  WBC 12.5 (*)    RBC 2.96 (*)    Hemoglobin 8.2 (*)    HCT 25.0 (*)    Platelets 657 (*)    Neutro Abs 8.0 (*)    Monocytes Absolute 1.5 (*)    All other components within normal limits  URINALYSIS, ROUTINE W REFLEX MICROSCOPIC - Abnormal; Notable for the following components:   Specific Gravity, Urine 1.032 (*)    Protein, ur 30 (*)    All other components within normal limits  RESP PANEL BY RT-PCR (FLU A&B, COVID) ARPGX2  CULTURE, BLOOD (ROUTINE X 2)  CULTURE, BLOOD (ROUTINE X 2)  PROTIME-INR  LIPASE, BLOOD    EKG EKG Interpretation  Date/Time:  Tuesday December 15 2021 15:06:43 EDT Ventricular Rate:  137 PR Interval:  132 QRS Duration: 92 QT Interval:  283 QTC Calculation: 428 R Axis:   51 Text Interpretation: Sinus tachycardia No old tracing to compare Confirmed by Wynona Dove ([id number]) on 12/15/2021 3:50:36 PM  Radiology CT CHEST ABDOMEN PELVIS W CONTRAST  Result Date: 12/15/2021 CLINICAL DATA:  Sepsis. Getting iron infusions. Feeling sick for a month. EXAM: CT CHEST, ABDOMEN, AND PELVIS WITH CONTRAST TECHNIQUE: Multidetector CT imaging of the chest, abdomen and pelvis was performed following the standard protocol during bolus administration of intravenous contrast. RADIATION DOSE REDUCTION: This exam was performed according to the departmental dose-optimization program which includes automated exposure control, adjustment of the mA and/or kV according to  patient size and/or use of iterative reconstruction technique. CONTRAST:  46mL OMNIPAQUE IOHEXOL 300 MG/ML  SOLN COMPARISON:  CT abdomen pelvis November 13, 2021 FINDINGS: CT CHEST FINDINGS Cardiovascular: Normal caliber thoracic aorta. No central pulmonary embolus on this nondedicated study. Normal size heart. No significant pericardial effusion/thickening. Mediastinum/Nodes: No suspicious thyroid nodule. No pathologically enlarged mediastinal, hilar or axillary lymph nodes. The esophagus is grossly unremarkable. Lungs/Pleura: No suspicious pulmonary nodules or masses. No focal airspace consolidation. No pleural effusion. No pneumothorax. Musculoskeletal: Bilateral gynecomastia. No acute osseous abnormality. CT ABDOMEN PELVIS FINDINGS Hepatobiliary: No suspicious hepatic lesion. Gallbladder is unremarkable. No biliary ductal dilation. Pancreas: No pancreatic ductal dilation or evidence of acute inflammation. Spleen: No splenomegaly or focal splenic lesion. Adrenals/Urinary Tract: Bilateral adrenal glands appear normal. No hydronephrosis. Kidneys demonstrate symmetric enhancement. Urinary bladder is nondistended limiting evaluation. Stomach/Bowel: No radiopaque enteric contrast material was administered. Stomach is unremarkable for degree of distension. No pathologic dilation of small or large bowel. Normal appendix. Pancolonic inflammation is slightly worsened in comparison to prior. Vascular/Lymphatic: Normal caliber abdominal aorta. No pathologically enlarged abdominal or pelvic lymph nodes. Reproductive: Prostate is unremarkable. Other: No pneumoperitoneum. No portal venous gas. No walled off fluid collections. No significant abdominopelvic free fluid. Musculoskeletal: No acute osseous abnormality. IMPRESSION: Pancolonic inflammation is slightly worsened in comparison to prior, most consistent with a nonspecific infectious or inflammatory colitis. No portal venous gas and no evidence bowel obstruction or  perforation. Electronically Signed   By: Dahlia Bailiff M.D.   On: 12/15/2021 17:16   DG Chest Port 1 View  Result Date: 12/15/2021 CLINICAL DATA:  Abnormal labs, fever, weakness EXAM: PORTABLE CHEST 1 VIEW COMPARISON:  None Available. FINDINGS: Cardiac and mediastinal contours are within normal limits. No focal pulmonary opacity. No pleural effusion or pneumothorax. No acute osseous abnormality. IMPRESSION: No acute cardiopulmonary process. Electronically Signed   By: Merilyn Baba M.D.   On: 12/15/2021 15:42    Procedures .Critical Care  Performed by: Jeanell Sparrow, DO Authorized by: Jeanell Sparrow, DO   Critical  care provider statement:    Critical care time (minutes):  30   Critical care time was exclusive of:  Separately billable procedures and treating other patients   Critical care was necessary to treat or prevent imminent or life-threatening deterioration of the following conditions:  Circulatory failure and sepsis   Critical care was time spent personally by me on the following activities:  Development of treatment plan with patient or surrogate, discussions with consultants, evaluation of patient's response to treatment, examination of patient, ordering and review of laboratory studies, ordering and review of radiographic studies, ordering and performing treatments and interventions, pulse oximetry, re-evaluation of patient's condition, review of old charts and obtaining history from patient or surrogate   Care discussed with: admitting provider       Medications Ordered in ED Medications  metroNIDAZOLE (FLAGYL) IVPB 500 mg (500 mg Intravenous New Bag/Given 12/15/21 2130)  lactated ringers infusion ( Intravenous New Bag/Given 12/15/21 2131)  lactated ringers bolus 1,000 mL (0 mLs Intravenous Stopped 12/15/21 1751)  acetaminophen (TYLENOL) tablet 650 mg (650 mg Oral Given 12/15/21 1534)  iohexol (OMNIPAQUE) 300 MG/ML solution 100 mL (85 mLs Intravenous Contrast Given 12/15/21 1656)   lactated ringers bolus 1,000 mL (0 mLs Intravenous Stopped 12/15/21 2023)  cefTRIAXone (ROCEPHIN) 1 g in sodium chloride 0.9 % 100 mL IVPB (0 g Intravenous Stopped 12/15/21 2131)  ondansetron (ZOFRAN) injection 4 mg (4 mg Intravenous Given 12/15/21 2117)    ED Course/ Medical Decision Making/ A&P Clinical Course as of 12/15/21 2143  Tue Dec 15, 2021  2030 Hemoglobin baseline 13-14.  6 days ago 9.8, today hemoglobin 8.2.  Normocytic anemia. [SG]    Clinical Course User Index [SG] Sloan Leiter, DO                           Medical Decision Making Amount and/or Complexity of Data Reviewed Labs: ordered. Radiology: ordered.  Risk OTC drugs. Prescription drug management. Decision regarding hospitalization.    CC: abnormal vitals, tachy, pale, febrile  This patient presents to the Emergency Department for the above complaint. This involves an extensive number of treatment options and is a complaint that carries with it a high risk of complications and morbidity. Vital signs were reviewed. Serious etiologies considered.  Differential diagnosis for adult fever includes but is not exclusive to community-acquired pneumonia, urinary tract infection, acute cholecystitis, viral syndrome, cellulitis, tick bourne disease,  decubitus ulcer, necrotizing fasciitis, meningitis, encephalitis, influenza, etc.    Record review:  Previous records obtained and reviewed prior office visits, prior labs  Additional history obtained from mother  Medical and surgical history as noted above.   Work up as above, notable for:  Labs & imaging results that were available during my care of the patient were visualized by me and considered in my medical decision making.  Physical exam as above.   I ordered imaging studies which included CXR, CT CAP. I visualized the imaging, interpreted images, and I agree with radiologist interpretation.  Pancolitis, no obstruction.  Mildly worsening colitis from prior  imaging  Cardiac monitoring reviewed and interpreted personally which shows sinus tachycardia   Lab reviewed, hemoglobin worsened over the past week.  Baseline around 13-14, 1 week ago 9.8, today 8.2.  Patient ongoing rectal bleeding intermittently.  Patient does receive iron infusion but did not receive it today.    Management: IV fluids, analgesics, antiemetic and antibiotics (rocephin/flagyl)  ED Course: Clinical Course as of 12/15/21 2143  Tue Dec 15, 2021  2030 Hemoglobin baseline 13-14.  6 days ago 9.8, today hemoglobin 8.2.  Normocytic anemia. [SG]    Clinical Course User Index [SG] Jeanell Sparrow, DO     Reassessment:  Heart rate has mildly improved.  Patient with ongoing palpitations, heart rate 115-120.  Still having some ongoing nausea.  We will give further IV fluids, antiemetics.  Given pancolitis, leukocytosis, lactic acidosis will also cover with Rocephin Flagyl.  Blood cultures were sent previously.  Admission was considered.    Patient with persistent tachycardia, symptomatic anemia.  SIRS.  Ongoing colitis, worsened from prior.  Ongoing GI bleed.  Recommend admission for the above.  Patient and mother are agreeable.  Patient is amenable to blood transfusion  Discussed with Dr. Hal Hope who accepts patient for admission         Social determinants of health include -  Social History   Socioeconomic History   Marital status: Single    Spouse name: Not on file   Number of children: Not on file   Years of education: Not on file   Highest education level: Some college, no degree  Occupational History   Not on file  Tobacco Use   Smoking status: Never   Smokeless tobacco: Never  Vaping Use   Vaping Use: Never used  Substance and Sexual Activity   Alcohol use: Never   Drug use: Never   Sexual activity: Never  Other Topics Concern   Not on file  Social History Narrative   Marital status/children/pets: Single.    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[employer])   Safety:      -Wears a bicycle helmet riding a bike: Yes     -smoke alarm in the home:Yes     - wears seatbelt: Yes     - Feels safe in their relationships: Yes   Social Determinants of Health   Financial Resource Strain: Low Risk  (11/02/2021)   Overall Financial Resource Strain (CARDIA)    Difficulty of Paying Living Expenses: Not hard at all  Food Insecurity: No Food Insecurity (11/02/2021)   Hunger Vital Sign    Worried About Running Out of Food in the Last Year: Never true    Ran Out of Food in the Last Year: Never true  Transportation Needs: No Transportation Needs (11/02/2021)   PRAPARE - Hydrologist (Medical): No    Lack of Transportation (Non-Medical): No  Physical Activity: Unknown (11/02/2021)   Exercise Vital Sign    Days of Exercise per Week: 0 days    Minutes of Exercise per Session: Not on file  Stress: No Stress Concern Present (11/02/2021)   [place]    Feeling of Stress : Not at all  Social Connections: Socially Isolated (11/02/2021)   Social Connection and Isolation Panel [NHANES]    Frequency of Communication with Friends and Family: Never    Frequency of Social Gatherings with Friends and Family: More than three times a week    Attends Religious Services: Never    Marine scientist or Organizations: No    Attends Music therapist: Not on file    Marital Status: Never married  Intimate Partner Violence: Not on file      This chart was dictated using Armed forces training and education officer.  Despite best efforts to proofread,  errors can occur which can change the documentation meaning.         Final  Clinical Impression(s) / ED Diagnoses Final diagnoses:  Symptomatic anemia  SIRS (systemic inflammatory response syndrome) (HCC)  Colitis    Rx / DC Orders ED Discharge Orders     None         Sloan Leiter, DO 12/15/21  2143

## 2021-12-15 NOTE — Assessment & Plan Note (Addendum)
Apparently causing sirs/sepsis and GIB. Hematochezia for 1 month + now. 1. Empiric Rocephin + flagyl for the moment 2. GI pathogen pnl and C.Diff testing was just done yesterday, and is negative. 3. Thus Inflammatory bowel disease (Crohn's / UC) does seem a bit more likely given the negative infectious work up thus far.

## 2021-12-15 NOTE — Progress Notes (Signed)
   12/15/21 2222  Assess: MEWS Score  Temp 99.2 F (37.3 C)  BP 131/81  MAP (mmHg) 94  Pulse Rate (!) 112  Resp 20  Level of Consciousness Alert  SpO2 98 %  O2 Device Room Air  Assess: MEWS Score  MEWS Temp 0  MEWS Systolic 0  MEWS Pulse 2  MEWS RR 0  MEWS LOC 0  MEWS Score 2  MEWS Score Color Yellow  Assess: if the MEWS score is Yellow or Red  Were vital signs taken at a resting state? Yes  Focused Assessment No change from prior assessment  Does the patient meet 2 or more of the SIRS criteria? Yes  Does the patient have a confirmed or suspected source of infection? Yes  Provider and Rapid Response Notified? Yes  MEWS guidelines implemented *See Row Information* Yes  Treat  Pain Scale 0-10  Pain Score 0  Take Vital Signs  Increase Vital Sign Frequency  Yellow: Q 2hr X 2 then Q 4hr X 2, if remains yellow, continue Q 4hrs  Escalate  MEWS: Escalate Yellow: discuss with charge nurse/RN and consider discussing with provider and RRT  Notify: Charge Nurse/RN  Name of Charge Nurse/RN Notified Calvin RN  Date Charge Nurse/RN Notified 12/15/21  Time Charge Nurse/RN Notified 2229  Notify: Provider  Provider Name/Title Julian Reil  Date Provider Notified 12/15/21  Time Provider Notified 2230  Method of Notification Face-to-face  Notification Reason Other (Comment)  Provider response En route  Date of Provider Response 12/15/21  Time of Provider Response 2230  Document  Patient Outcome Stabilized after interventions  Progress note created (see row info) Yes  Assess: SIRS CRITERIA  SIRS Temperature  0  SIRS Pulse 1  SIRS Respirations  0  SIRS WBC 1  SIRS Score Sum  2

## 2021-12-15 NOTE — Progress Notes (Signed)
2:00pm- Pt arrived to clinic for schedule Venofer infusion. Appeared extremely pale and weak. Upon assessing vital signs, pt noted to be tachycardic at 139bpm, febrile at 100.5 F, BP 109/73, and reports runny nose and sneezing over past couple of days. Clinic supervisor, Bayard Hugger, notified, and is in agreement that pt should go to ER. Pt accompanied by mother. Informed that clinic staff feel that it is in pts best interest to go to ER for further evaluation and treatment. Pt and mother verbalized understanding, and are in agreement. Venofer not infused.     Nat Math, RN, BSN

## 2021-12-15 NOTE — Assessment & Plan Note (Addendum)
SIRS from IBD vs sepsis from infectious colitis.  Treating via Sepsis pathway for the moment, but IBD seems more likely (see above). Specifically the patient has at least 2 out of 4 SIRS criteria, namely: Fever, tachycardia, leukocytosis Lactic acid: 2.7, 2.3 after IVF Blood pressure: 131/81 IVF: 2L bolus and 100 cc/hr Antibiotics: Rocephin + flagyl Cultures pending (see Colitis above)

## 2021-12-15 NOTE — ED Notes (Signed)
Patient ambulates to restroom  without distress. Steady gait noted

## 2021-12-15 NOTE — Assessment & Plan Note (Addendum)
Ongoing for past 2 months it seems Had outpt colonoscopy scheduled with GI Looks like progressive development of ABLA over past 1 month. See ABLA above Infectious, UC, or crohn's seem like possibilities. Will put in for [facility] consult in AM

## 2021-12-15 NOTE — ED Triage Notes (Signed)
Patient reports to the ER for the infusion clinic, where he was supposed to get iron. Patient is tachycardic, febrile, pale, and weak.

## 2021-12-15 NOTE — Assessment & Plan Note (Addendum)
Progressive over past 1 month. Likely due to ongoing hematochezia. 1. Type and screen 2. Repeat CBC in AM 3. GI consult in AM

## 2021-12-15 NOTE — H&P (Signed)
History and Physical    Patient: Willie James YTK:[id number] DOB: Sep 27, 2000 DOA: 12/15/2021 DOS: the patient was seen and examined on 12/15/2021 PCP: [name], DO  Patient coming from: Home  Chief Complaint:  Chief Complaint  Patient presents with   Abnormal Lab   Fever   HPI: Willie James is a 21 y.o. male with medical history significant of previously health.  2 months ago pt began having hematochezia.  Intermittent abd pain.  This has progressively worsened, especially over past 1 month.  Saw GI recently.  Stool studies ordered which were neg for C.Diff or other infectious organism (GI pathogen pnl).  Colonoscopy scheduled for 8/7 with [facility].  Today went to infusion clinic for iron infusion.  Found to be febrile and tachycardic and sent to ED.  Developed cough over past 1.5 weeks which is new.  Review of Systems: As mentioned in the history of present illness. All other systems reviewed and are negative. Past Medical History:  Diagnosis Date   Frequent headaches    Left ventricular hypertrophy by electrocardiogram 10/28/2015   echo after was normal.    Palpitations 10/30/2015   pt reports palpitaitons and chest discomfort. EKG (reported LVH) and then NORMAL echo completed.    Past Surgical History:  Procedure Laterality Date   NO PAST SURGERIES     Social History:  reports that he has never smoked. He has never used smokeless tobacco. He reports that he does not drink alcohol and does not use drugs.  No Known Allergies  Family History  Problem Relation Age of Onset   Hyperlipidemia Father    Arthritis Maternal Grandmother    Diabetes Maternal Grandmother    Hyperlipidemia Maternal Grandmother    Hypertension Maternal Grandmother    Hypertension Maternal Grandfather    Hyperlipidemia Maternal Grandfather    Prostate cancer Maternal Grandfather    Cervical cancer Paternal Grandmother    Hypertension Paternal Grandfather    Heart disease Paternal  Grandfather    Colon cancer Neg Hx    Esophageal cancer Neg Hx     Prior to Admission medications   Not on File    Physical Exam: Vitals:   12/15/21 2030 12/15/21 2130 12/15/21 2132 12/15/21 2222  BP: 130/84 116/73  131/81  Pulse: (!) 120 (!) 121  (!) 112  Resp: 12 (!) 24  20  Temp:   98.5 F (36.9 C) 99.2 F (37.3 C)  TempSrc:   Oral   SpO2: 100% 98%  98%  Weight:      Height:       Constitutional: NAD, calm, comfortable Eyes: PERRL, lids and conjunctivae normal ENMT: Mucous membranes are moist. Posterior pharynx clear of any exudate or lesions.Normal dentition.  Neck: normal, supple, no masses, no thyromegaly Respiratory: clear to auscultation bilaterally, no wheezing, no crackles. Normal respiratory effort. No accessory muscle use.  Cardiovascular: Tachycardic Abdomen: no tenderness, no masses palpated. No hepatosplenomegaly. Bowel sounds positive.  Musculoskeletal: no clubbing / cyanosis. No joint deformity upper and lower extremities. Good ROM, no contractures. Normal muscle tone.  Skin: no rashes, lesions, ulcers. No induration, pale Neurologic: CN 2-12 grossly intact. Sensation intact, DTR normal. Strength 5/5 in all 4.  Psychiatric: Normal judgment and insight. Alert and oriented x 3. Normal mood.   Data Reviewed:       Latest Ref Rng & Units 12/15/2021    3:15 PM 12/09/2021    4:37 PM 11/13/2021    1:49 PM  CBC  WBC 4.0 - 10.5  K/uL 12.5  13.5  12.9   Hemoglobin 13.0 - 17.0 g/dL 8.2  9.8 Repeated and verified X2.  13.0   Hematocrit 39.0 - 52.0 % 25.0  29.4  37.6   Platelets 150 - 400 K/uL 657  589.0  463    CMP     Component Value Date/Time   NA 135 12/15/2021 1515   K 3.1 (L) 12/15/2021 1515   CL 95 (L) 12/15/2021 1515   CO2 26 12/15/2021 1515   GLUCOSE 109 (H) 12/15/2021 1515   BUN 8 12/15/2021 1515   CREATININE 0.87 12/15/2021 1515   CALCIUM 9.1 12/15/2021 1515   PROT 7.1 12/15/2021 1515   ALBUMIN 3.5 12/15/2021 1515   AST 12 (L) 12/15/2021 1515    ALT 8 12/15/2021 1515   ALKPHOS 70 12/15/2021 1515   BILITOT 0.3 12/15/2021 1515   GFRNONAA >60 12/15/2021 1515   CXR neg  Assessment and Plan: * Colitis Apparently causing sirs/sepsis and GIB. Hematochezia for 1 month + now. Empiric Rocephin + flagyl for the moment GI pathogen pnl and C.Diff testing was just done yesterday, and is negative. Thus Inflammatory bowel disease (Crohn's / UC) does seem a bit more likely given the negative infectious work up thus far.  SIRS (systemic inflammatory response syndrome) (HCC) SIRS from IBD vs sepsis from infectious colitis.  Treating via Sepsis pathway for the moment, but IBD seems more likely (see above). Specifically the patient has at least 2 out of 4 SIRS criteria, namely: Fever, tachycardia, leukocytosis Lactic acid: 2.7, 2.3 after IVF Blood pressure: 131/81 IVF: 2L bolus and 100 cc/hr Antibiotics: Rocephin + flagyl Cultures pending (see Colitis above)   ABLA (acute blood loss anemia) Progressive over past 1 month. Likely due to ongoing hematochezia. Type and screen Repeat CBC in AM GI consult in AM  Hematochezia Ongoing for past 2 months it seems Had outpt colonoscopy scheduled with GI Looks like progressive development of ABLA over past 1 month. See ABLA above Infectious, UC, or crohn's seem like possibilities. Will put in for [facility] consult in AM      Advance Care Planning:   Code Status: Full Code  Consults: Message sent to Dr. Christella Hartigan for GI consult in AM  Family Communication: Family at bedside  Severity of Illness: The appropriate patient status for this patient is INPATIENT. Inpatient status is judged to be reasonable and necessary in order to provide the required intensity of service to ensure the patient's safety. The patient's presenting symptoms, physical exam findings, and initial radiographic and laboratory data in the context of their chronic comorbidities is felt to place them at high risk for further clinical  deterioration. Furthermore, it is not anticipated that the patient will be medically stable for discharge from the hospital within 2 midnights of admission.   * I certify that at the point of admission it is my clinical judgment that the patient will require inpatient hospital care spanning beyond 2 midnights from the point of admission due to high intensity of service, high risk for further deterioration and high frequency of surveillance required.*  Author: Hillary Bow., DO 12/15/2021 11:02 PM  For on call review [email or web address].

## 2021-12-16 DIAGNOSIS — R933 Abnormal findings on diagnostic imaging of other parts of digestive tract: Secondary | ICD-10-CM

## 2021-12-16 DIAGNOSIS — K529 Noninfective gastroenteritis and colitis, unspecified: Secondary | ICD-10-CM | POA: Diagnosis not present

## 2021-12-16 DIAGNOSIS — R112 Nausea with vomiting, unspecified: Secondary | ICD-10-CM

## 2021-12-16 LAB — GI PROFILE, STOOL, PCR

## 2021-12-16 LAB — COMPREHENSIVE METABOLIC PANEL
ALT: 9 U/L (ref 0–44)
AST: 10 U/L — ABNORMAL LOW (ref 15–41)
Albumin: 2.3 g/dL — ABNORMAL LOW (ref 3.5–5.0)
Alkaline Phosphatase: 56 U/L (ref 38–126)
Anion gap: 7 (ref 5–15)
BUN: 7 mg/dL (ref 6–20)
CO2: 26 mmol/L (ref 22–32)
Calcium: 7.8 mg/dL — ABNORMAL LOW (ref 8.9–10.3)
Chloride: 102 mmol/L (ref 98–111)
Creatinine, Ser: 0.77 mg/dL (ref 0.61–1.24)
GFR, Estimated: >60 mL/min (ref >60)
Glucose, Bld: 100 mg/dL — ABNORMAL HIGH (ref 70–99)
Potassium: 3.5 mmol/L (ref 3.5–5.1)
Sodium: 135 mmol/L (ref 135–145)
Total Bilirubin: 0.4 mg/dL (ref 0.3–1.2)
Total Protein: 5.6 g/dL — ABNORMAL LOW (ref 6.5–8.1)

## 2021-12-16 LAB — CBC
HCT: 21.1 % — ABNORMAL LOW (ref 39.0–52.0)
Hemoglobin: 6.6 g/dL — CL (ref 13.0–17.0)
MCH: 27.6 pg (ref 26.0–34.0)
MCHC: 31.3 g/dL (ref 30.0–36.0)
MCV: 88.3 fL (ref 80.0–100.0)
Platelets: 477 10*3/uL — ABNORMAL HIGH (ref 150–400)
RBC: 2.39 MIL/uL — ABNORMAL LOW (ref 4.22–5.81)
RDW: 12.9 % (ref 11.5–15.5)
WBC: 9.5 10*3/uL (ref 4.0–10.5)
nRBC: 0 % (ref 0.0–0.2)

## 2021-12-16 LAB — CALPROTECTIN, FECAL: Calprotectin, Fecal: 1500 ug/g — ABNORMAL HIGH (ref 0–120)

## 2021-12-16 LAB — HEPATITIS B SURFACE ANTIBODY,QUALITATIVE: Hep B S Ab: NONREACTIVE

## 2021-12-16 LAB — ABO/RH: ABO/RH(D): O POS

## 2021-12-16 LAB — PREPARE RBC (CROSSMATCH)

## 2021-12-16 LAB — HIV ANTIBODY (ROUTINE TESTING W REFLEX): HIV Screen 4th Generation wRfx: NONREACTIVE

## 2021-12-16 LAB — HEMOGLOBIN AND HEMATOCRIT, BLOOD
HCT: 24.8 % — ABNORMAL LOW (ref 39.0–52.0)
Hemoglobin: 7.8 g/dL — ABNORMAL LOW (ref 13.0–17.0)

## 2021-12-16 MED ORDER — PEG-KCL-NACL-NASULF-NA ASC-C 100 G PO SOLR
0.5000 | Freq: Once | ORAL | Status: AC
  Administered 2021-12-16: 100 g via ORAL
  Filled 2021-12-16: qty 1

## 2021-12-16 MED ORDER — SODIUM CHLORIDE 0.9 % IV SOLN
250.0000 mg | Freq: Every day | INTRAVENOUS | Status: AC
  Administered 2021-12-16 – 2021-12-17 (×2): 250 mg via INTRAVENOUS
  Filled 2021-12-16 (×2): qty 20

## 2021-12-16 MED ORDER — PEG-KCL-NACL-NASULF-NA ASC-C 100 G PO SOLR
1.0000 | Freq: Once | ORAL | Status: DC
Start: 2021-12-16 — End: 2021-12-16

## 2021-12-16 MED ORDER — FERROUS SULFATE 325 (65 FE) MG PO TABS: 325.0000 mg | ORAL_TABLET | Freq: Every day | ORAL | Status: DC

## 2021-12-16 MED ORDER — SODIUM CHLORIDE 0.9 % IV SOLN: INTRAVENOUS | Status: DC

## 2021-12-16 MED ORDER — METHYLPREDNISOLONE SODIUM SUCC 40 MG IJ SOLR
40.0000 mg | Freq: Every day | INTRAMUSCULAR | Status: DC
  Administered 2021-12-16 – 2021-12-18 (×3): 40 mg via INTRAVENOUS
  Filled 2021-12-16 (×3): qty 1

## 2021-12-16 MED ORDER — PEG-KCL-NACL-NASULF-NA ASC-C 100 G PO SOLR
0.5000 | Freq: Once | ORAL | Status: AC
  Administered 2021-12-17: 100 g via ORAL
  Filled 2021-12-16: qty 1

## 2021-12-16 MED ORDER — SODIUM CHLORIDE 0.9% IV SOLUTION: Freq: Once | INTRAVENOUS | Status: AC

## 2021-12-16 NOTE — Progress Notes (Signed)
PROGRESS NOTE  Willie James  JJK:[id number] DOB: [birth year] DOA: 12/15/2021 PCP: [name], DO   Brief Narrative:  Patient is a 21 year old male with history of hematochezia, intermittent abdominal pain was referred to the emergency department after he was found to be febrile and tachycardic at GI clinic.  Abdominal CT done in the emergency department showed pancolitis.  GI following.  Hospital course also remarkable for anemia with hemoglobin dropping to the range of 6.  Assessment & Plan:  Principal Problem:   Colitis Active Problems:   SIRS (systemic inflammatory response syndrome) (HCC)   ABLA (acute blood loss anemia)   Hematochezia   Pancolitis: Was seen in GI clinic for abdominal pain.  Abdominal CT significant for colitis.  Continue supportive care, pain management, IV fluids, antiemetics GI pathogen panel, C. difficile testing recently done, negative. Likely inflammatory bowel disease.  GI will follow  SIRS: Patient was febrile, tachycardic at GI clinic.  Likely acid with pancolitis, underlying inflammatory bowel disease.  Lab work showed leukocytosis, elevated lactate.  Currently stable.  Continue gentle IV fluids.  Started on ceftriaxone, Flagyl.  Hematochezia/acute blood loss anemia/iron deficiency: Complaining of hematochezia since 1-2 month.  Hemoglobin dropped to the range of 6 from 8.  Given a unit of blood transfusion.  Monitor CBC.  GI will follow He has an outpatient colonoscopy scheduled with GI, might need to be done as an inpatient. Lab work on 7/19 showed iron level of 11.  Continue oral supplementation  Obesity: BMI of 31.5          DVT prophylaxis:SCDs Start: 12/15/21 2245     Code Status: Full Code  Family Communication: Discussed with mother at bedside  Patient status:Inpatient  Patient is from :Home  Anticipated discharge BZ:[facility]  Estimated DC date:1-2 days   Consultants: GI  Procedures:None  Antimicrobials:  Anti-infectives  (From admission, onward)    Start     Dose/Rate Route Frequency Ordered Stop   12/16/21 2200  cefTRIAXone (ROCEPHIN) 2 g in sodium chloride 0.9 % 100 mL IVPB        2 g 200 mL/hr over 30 Minutes Intravenous Every 24 hours 12/15/21 2225     12/16/21 0800  metroNIDAZOLE (FLAGYL) IVPB 500 mg        500 mg 100 mL/hr over 60 Minutes Intravenous Every 12 hours 12/15/21 2225     12/15/21 2315  cefTRIAXone (ROCEPHIN) 1 g in sodium chloride 0.9 % 100 mL IVPB        1 g 200 mL/hr over 30 Minutes Intravenous  Once 12/15/21 2225 12/16/21 0754   12/15/21 2045  cefTRIAXone (ROCEPHIN) 1 g in sodium chloride 0.9 % 100 mL IVPB        1 g 200 mL/hr over 30 Minutes Intravenous  Once 12/15/21 2030 12/15/21 2131   12/15/21 2045  metroNIDAZOLE (FLAGYL) IVPB 500 mg        500 mg 100 mL/hr over 60 Minutes Intravenous  Once 12/15/21 2030 12/16/21 0753       Subjective: Patient seen and examined at bedside this morning.  Hemodynamically stable.  Denies any abdominal pain during my evaluation.  Had a bowel movement yesterday with some mixed blood.  Mother at  bedside  Objective: Vitals:   12/16/21 0021 12/16/21 0427 12/16/21 0733 12/16/21 0759  BP: 125/77 122/72 132/81 120/76  Pulse: (!) 118 (!) 110 (!) 107 (!) 107  Resp: 17 17 16 17   Temp: 99.4 F (37.4 C) 98.8 F (37.1 C) 98.3 F (  36.8 C) 98.3 F (36.8 C)  TempSrc: Oral Oral Oral Oral  SpO2: 99% 97% 99%   Weight:      Height:        Intake/Output Summary (Last 24 hours) at 12/16/2021 0918 Last data filed at 12/16/2021 0910 Gross per 24 hour  Intake 2554.9 ml  Output --  Net 2554.9 ml   Filed Weights   12/15/21 1448 12/15/21 1458  Weight: 80.7 kg 80.7 kg    Examination:  General exam: Overall comfortable, not in distress,obese HEENT: PERRL Respiratory system:  no wheezes or crackles  Cardiovascular system: S1 & S2 heard, RRR.  Gastrointestinal system: Abdomen is nondistended, soft and nontender. Central nervous system: Alert and  oriented Extremities: No edema, no clubbing ,no cyanosis Skin: No rashes, no ulcers,no icterus     Data Reviewed: I have personally reviewed following labs and imaging studies  CBC: Recent Labs  Lab 12/09/21 1637 12/15/21 1515 12/16/21 0521  WBC 13.5* 12.5* 9.5  NEUTROABS  --  8.0*  --   HGB 9.8 Repeated and verified X2.* 8.2* 6.6*  HCT 29.4* 25.0* 21.1*  MCV 85.1 84.5 88.3  PLT 589.0* 657* 477*   Basic Metabolic Panel: Recent Labs  Lab 12/15/21 1515 12/16/21 0521  NA 135 135  K 3.1* 3.5  CL 95* 102  CO2 26 26  GLUCOSE 109* 100*  BUN 8 7  CREATININE 0.87 0.77  CALCIUM 9.1 7.8*     Recent Results (from the past 240 hour(s))  Calprotectin, Fecal     Status: Abnormal   Collection Time: 12/14/21  4:11 PM   Specimen: Stool  Result Value Ref Range Status   Calprotectin, Fecal 1,500 (H) 0 - 120 ug/g Final    Comment: **Results verified by repeat testing** Concentration     Interpretation   Follow-Up < 5 - 50 ug/g     Normal           None >50 -120 ug/g     Borderline       Re-evaluate in 4-6 weeks     >120 ug/g     Abnormal         Repeat as clinically                                    indicated   Culture, blood (Routine x 2)     Status: None (Preliminary result)   Collection Time: 12/15/21  3:15 PM   Specimen: Left Antecubital; Blood  Result Value Ref Range Status   Specimen Description   Final    LEFT ANTECUBITAL Performed at [facility], [address], [place], [place] [address]    Special Requests   Final    BOTTLES DRAWN AEROBIC AND ANAEROBIC Blood Culture adequate volume Performed at [facility], [address], [place], [place] [address]    Culture   Final    NO GROWTH < 24 HOURS Performed at [facility], [address] [place], [place] [address]    Report Status PENDING  Incomplete  Culture, blood (Routine x 2)     Status: None (Preliminary result)   Collection Time: 12/15/21  3:15 PM   Specimen:  BLOOD LEFT ARM  Result Value Ref Range Status   Specimen Description   Final    BLOOD LEFT ARM Performed at [facility], [address], [place], [place] [address]  Special Requests   Final    BOTTLES DRAWN AEROBIC AND ANAEROBIC Blood Culture adequate volume Performed at [facility], [address] [place], [place] [address]    Culture   Final    NO GROWTH < 24 HOURS Performed at [facility], [address], [place], [place] [address]    Report Status PENDING  Incomplete  Resp Panel by RT-PCR (Flu A&B, Covid) Anterior Nasal Swab     Status: None   Collection Time: 12/15/21  3:15 PM   Specimen: Anterior Nasal Swab  Result Value Ref Range Status   SARS Coronavirus 2 by RT PCR NEGATIVE NEGATIVE Final    Comment: (NOTE) SARS-CoV-2 target nucleic acids are NOT DETECTED.  The SARS-CoV-2 RNA is generally detectable in upper respiratory specimens during the acute phase of infection. The lowest concentration of SARS-CoV-2 viral copies this assay can detect is 138 copies/mL. A negative result does not preclude SARS-Cov-2 infection and should not be used as the sole basis for treatment or other patient management decisions. A negative result may occur with  improper specimen collection/handling, submission of specimen other than nasopharyngeal swab, presence of viral mutation(s) within the areas targeted by this assay, and inadequate number of viral copies(<138 copies/mL). A negative result must be combined with clinical observations, patient history, and epidemiological information. The expected result is Negative.  Fact Sheet for Patients:  BloggerCourse.com  Fact Sheet for Healthcare Providers:  SeriousBroker.it  This test is no t yet approved or cleared by the [place] FDA and  has been authorized for detection and/or diagnosis of SARS-CoV-2 by FDA under an Emergency Use  Authorization (EUA). This EUA will remain  in effect (meaning this test can be used) for the duration of the COVID-19 declaration under Section 564(b)(1) of the Act, 21 U.S.C.section 360bbb-3(b)(1), unless the authorization is terminated  or revoked sooner.       Influenza A by PCR NEGATIVE NEGATIVE Final   Influenza B by PCR NEGATIVE NEGATIVE Final    Comment: (NOTE) The Xpert Xpress SARS-CoV-2/FLU/RSV plus assay is intended as an aid in the diagnosis of influenza from Nasopharyngeal swab specimens and should not be used as a sole basis for treatment. Nasal washings and aspirates are unacceptable for Xpert Xpress SARS-CoV-2/FLU/RSV testing.  Fact Sheet for Patients: BloggerCourse.com  Fact Sheet for Healthcare Providers: SeriousBroker.it  This test is not yet approved or cleared by the [place] FDA and has been authorized for detection and/or diagnosis of SARS-CoV-2 by FDA under an Emergency Use Authorization (EUA). This EUA will remain in effect (meaning this test can be used) for the duration of the COVID-19 declaration under Section 564(b)(1) of the Act, 21 U.S.C. section 360bbb-3(b)(1), unless the authorization is terminated or revoked.  Performed at [facility], [address], [place], [place] [address]      Radiology Studies: CT CHEST ABDOMEN PELVIS W CONTRAST  Result Date: 12/15/2021 CLINICAL DATA:  Sepsis. Getting iron infusions. Feeling sick for a month. EXAM: CT CHEST, ABDOMEN, AND PELVIS WITH CONTRAST TECHNIQUE: Multidetector CT imaging of the chest, abdomen and pelvis was performed following the standard protocol during bolus administration of intravenous contrast. RADIATION DOSE REDUCTION: This exam was performed according to the departmental dose-optimization program which includes automated exposure control, adjustment of the mA and/or kV according to patient size and/or use of iterative  reconstruction technique. CONTRAST:  76mL OMNIPAQUE IOHEXOL 300 MG/ML  SOLN COMPARISON:  CT abdomen pelvis November 13, 2021 FINDINGS: CT CHEST FINDINGS  Cardiovascular: Normal caliber thoracic aorta. No central pulmonary embolus on this nondedicated study. Normal size heart. No significant pericardial effusion/thickening. Mediastinum/Nodes: No suspicious thyroid nodule. No pathologically enlarged mediastinal, hilar or axillary lymph nodes. The esophagus is grossly unremarkable. Lungs/Pleura: No suspicious pulmonary nodules or masses. No focal airspace consolidation. No pleural effusion. No pneumothorax. Musculoskeletal: Bilateral gynecomastia. No acute osseous abnormality. CT ABDOMEN PELVIS FINDINGS Hepatobiliary: No suspicious hepatic lesion. Gallbladder is unremarkable. No biliary ductal dilation. Pancreas: No pancreatic ductal dilation or evidence of acute inflammation. Spleen: No splenomegaly or focal splenic lesion. Adrenals/Urinary Tract: Bilateral adrenal glands appear normal. No hydronephrosis. Kidneys demonstrate symmetric enhancement. Urinary bladder is nondistended limiting evaluation. Stomach/Bowel: No radiopaque enteric contrast material was administered. Stomach is unremarkable for degree of distension. No pathologic dilation of small or large bowel. Normal appendix. Pancolonic inflammation is slightly worsened in comparison to prior. Vascular/Lymphatic: Normal caliber abdominal aorta. No pathologically enlarged abdominal or pelvic lymph nodes. Reproductive: Prostate is unremarkable. Other: No pneumoperitoneum. No portal venous gas. No walled off fluid collections. No significant abdominopelvic free fluid. Musculoskeletal: No acute osseous abnormality. IMPRESSION: Pancolonic inflammation is slightly worsened in comparison to prior, most consistent with a nonspecific infectious or inflammatory colitis. No portal venous gas and no evidence bowel obstruction or perforation. Electronically Signed   By: Maudry Mayhew M.D.   On: 12/15/2021 17:16   DG Chest Port 1 View  Result Date: 12/15/2021 CLINICAL DATA:  Abnormal labs, fever, weakness EXAM: PORTABLE CHEST 1 VIEW COMPARISON:  None Available. FINDINGS: Cardiac and mediastinal contours are within normal limits. No focal pulmonary opacity. No pleural effusion or pneumothorax. No acute osseous abnormality. IMPRESSION: No acute cardiopulmonary process. Electronically Signed   By: Wiliam Ke M.D.   On: 12/15/2021 15:42    Scheduled Meds: Continuous Infusions:  cefTRIAXone (ROCEPHIN)  IV     lactated ringers Stopped (12/16/21 0745)   metronidazole       LOS: 1 day   Burnadette Pop, MD [facility] P7/26/2023, 9:18 AM

## 2021-12-16 NOTE — H&P (View-Only) (Signed)
Referring Provider: Dr. Alcario Drought, [facility] Primary Care Physician:  Ma Hillock, DO Primary Gastroenterologist:  Dr. Bryan Lemma  Reason for Consultation: Colitis  HPI: Luke Falero is a 21 y.o. male recently referred to the Gastroenterology Clinic for evaluation of nausea/vomiting and rectal bleeding.  Was seen by Dr. Bryan Lemma on 12/09/2021.  Per his office note as follows:   "Started as generalized abdominal pain and BRBPR 2+ months ago. No diarrhea initially.   Was initially seen by [facility] for this issue on 10/20/2021. - Abdominal x-ray: Moderate stool in ascending/transverse colon - Started stool softener, steroid suppository, recommended increased hydration with subsequent bowel movements and improvement in abdominal pain   Follow-up with PCM on 11/02/2021 with lower abdominal pain/pelvic pain.  Was still having hematochezia and stools transitioning to loose - Normal CBC, TSH, iron panel - FOBT positive x3 - CRP elevated at 13.2 - Placed referral to GI   Was then seen in the ER with abdominal pain, nausea/vomiting, BRBPR on 11/13/2021.  Was tachycardic at 130 bpm, but exam otherwise benign. - WBC 12.9, H/H 13/37.6, PLT 463 - Iron 27, TIBC 253, sat 11% - Normal CMP - CRP 3.9 - CT A/P: Diffuse colonic wall thickening.  Normal stomach, small bowel.  No lymphadenopathy - Discharged with ciprofloxacin 500 mg bid x4 days  Today, he states diarrhea still present. Having multiple BM/day now.  Can have up to 3-4 BM per hour at times.  Can have 2-3 nocturnal BM some nights, and other nights no sxs. Still w/ nausea and episodic non-bloody emesis, but improved with Zofran.   Has lost 34# since sxs onset. Decreased PO intake due to post prandial diarrhea. No arthralgias, skin issues, eye issues.    No prior similar sxs. No prior EGD, Colonoscopy.    No known family history of CRC, GI malignancy, liver disease, pancreatic disease, or IBD."  Stool for GI pathogens was ordered and was  negative.  Fecal calprotectin extremely elevated at 1500.  Labs showed iron deficiency anemia.  Plan was for IV iron infusions and outpatient EGD and colonoscopy that are scheduled for early August.  Unfortunately symptoms progressed and he became febrile with a Tmax here 101.3.  Tachycardic as well.  Admitted for SIRS type picture related to findings of colitis on recent CT scans.  Started on Rocephin and Flagyl.  Blood cultures ordered and are negative thus far.  Hemoglobin down to 6.6 g this morning so a unit of packed red blood cells has been ordered.    CT scan of the chest, abdomen, pelvis with contrast on 7/25 showed the following:  IMPRESSION: Pancolonic inflammation is slightly worsened in comparison to prior, most consistent with a nonspecific infectious or inflammatory colitis.   No portal venous gas and no evidence bowel obstruction or perforation.   Mother is at bedside.  Past Medical History:  Diagnosis Date   Frequent headaches    Left ventricular hypertrophy by electrocardiogram 10/28/2015   echo after was normal.    Palpitations 10/30/2015   pt reports palpitaitons and chest discomfort. EKG (reported LVH) and then NORMAL echo completed.     Past Surgical History:  Procedure Laterality Date   NO PAST SURGERIES      Prior to Admission medications   Not on File    Current Facility-Administered Medications  Medication Dose Route Frequency Provider Last Rate Last Admin   acetaminophen (TYLENOL) tablet 650 mg  650 mg Oral Q6H PRN Etta Quill, DO  Or   acetaminophen (TYLENOL) suppository 650 mg  650 mg Rectal Q6H PRN Etta Quill, DO       cefTRIAXone (ROCEPHIN) 2 g in sodium chloride 0.9 % 100 mL IVPB  2 g Intravenous Q24H Jennette Kettle M, DO       [START ON 12/17/2021] ferrous sulfate tablet 325 mg  325 mg Oral Q breakfast Shelly Coss, MD       lactated ringers infusion   Intravenous Continuous Jeanell Sparrow, DO 100 mL/hr at 12/16/21 9924  Infusion Verify at 12/16/21 0943   metroNIDAZOLE (FLAGYL) IVPB 500 mg  500 mg Intravenous Q12H Etta Quill, DO 100 mL/hr at 12/16/21 2683 Infusion Verify at 12/16/21 0943   ondansetron (ZOFRAN) tablet 4 mg  4 mg Oral Q6H PRN Etta Quill, DO       Or   ondansetron [facility]) injection 4 mg  4 mg Intravenous Q6H PRN Etta Quill, DO       peg 3350 powder (MOVIPREP) kit 200 g  1 kit Oral Once Devion Chriscoe D, PA-C        Allergies as of 12/15/2021   (No Known Allergies)    Family History  Problem Relation Age of Onset   Hyperlipidemia Father    Arthritis Maternal Grandmother    Diabetes Maternal Grandmother    Hyperlipidemia Maternal Grandmother    Hypertension Maternal Grandmother    Hypertension Maternal Grandfather    Hyperlipidemia Maternal Grandfather    Prostate cancer Maternal Grandfather    Cervical cancer Paternal Grandmother    Hypertension Paternal Grandfather    Heart disease Paternal Grandfather    Colon cancer Neg Hx    Esophageal cancer Neg Hx     Social History   Socioeconomic History   Marital status: Single    Spouse name: Not on file   Number of children: Not on file   Years of education: Not on file   Highest education level: Some college, no degree  Occupational History   Not on file  Tobacco Use   Smoking status: Never   Smokeless tobacco: Never  Vaping Use   Vaping Use: Never used  Substance and Sexual Activity   Alcohol use: Never   Drug use: Never   Sexual activity: Never  Other Topics Concern   Not on file  Social History Narrative   Marital status/children/pets: Single.    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[employer])   Safety:      -Wears a bicycle helmet riding a bike: Yes     -smoke alarm in the home:Yes     - wears seatbelt: Yes     - Feels safe in their relationships: Yes   Social Determinants of Health   Financial Resource Strain: Low Risk  (11/02/2021)   Overall Financial Resource Strain (CARDIA)    Difficulty of  Paying Living Expenses: Not hard at all  Food Insecurity: No Food Insecurity (11/02/2021)   Hunger Vital Sign    Worried About Running Out of Food in the Last Year: Never true    Ran Out of Food in the Last Year: Never true  Transportation Needs: No Transportation Needs (11/02/2021)   PRAPARE - Hydrologist (Medical): No    Lack of Transportation (Non-Medical): No  Physical Activity: Unknown (11/02/2021)   Exercise Vital Sign    Days of Exercise per Week: 0 days    Minutes of Exercise per Session: Not on file  Stress: No Stress Concern Present (  11/02/2021)   [place]    Feeling of Stress : Not at all  Social Connections: Socially Isolated (11/02/2021)   Social Connection and Isolation Panel [NHANES]    Frequency of Communication with Friends and Family: Never    Frequency of Social Gatherings with Friends and Family: More than three times a week    Attends Religious Services: Never    Marine scientist or Organizations: No    Attends Music therapist: Not on file    Marital Status: Never married  Intimate Partner Violence: Not on file    Review of Systems: ROS is O/W negative except as mentioned in HPI.  Physical Exam: Vital signs in last 24 hours: Temp:  [98.3 F (36.8 C)-101.3 F (38.5 C)] 98.6 F (37 C) (07/26 0920) Pulse Rate:  [105-151] 105 (07/26 0920) Resp:  [11-34] 16 (07/26 0920) BP: (109-134)/(72-90) 117/75 (07/26 0920) SpO2:  [97 %-100 %] 99 % (07/26 0920) Weight:  [80.7 kg-81.6 kg] 80.7 kg (07/25 1458) Last BM Date : 12/16/21 General:  Alert, Well-developed, well-nourished, pleasant and cooperative in NAD; appears pale. Head:  Normocephalic and atraumatic. Eyes:  Sclera clear, no icterus.  Conjunctiva pale. Ears:  Normal auditory acuity. Mouth:  No deformity or lesions.   Lungs:  Clear throughout to auscultation.   No wheezes, crackles, or rhonchi.   Heart:  Tachy.  No murmurs noted. Abdomen:  Soft, non-distended.  BS present, hyperactive.  Minimal diffuse TTP. Rectal:  Will be done at the time of colonoscopy.  Msk:  Symmetrical without gross deformities. Pulses:  Normal pulses noted. Extremities:  Without clubbing or edema. Neurologic:  Alert and oriented x 4;  grossly normal neurologically. Skin:  Intact without significant lesions or rashes. Psych:  Alert and cooperative. Normal mood and affect.  Intake/Output from previous day: 07/25 0701 - 07/26 0700 In: 2550.7 [I.V.:350; IV Piggyback:2200.7] Out: -  Intake/Output this shift: Total I/O In: 410.4 [I.V.:36.8; Blood:355; IV Piggyback:18.6] Out: -   Lab Results: Recent Labs    12/15/21 1515 12/16/21 0521  WBC 12.5* 9.5  HGB 8.2* 6.6*  HCT 25.0* 21.1*  PLT 657* 477*   BMET Recent Labs    12/15/21 1515 12/16/21 0521  NA 135 135  K 3.1* 3.5  CL 95* 102  CO2 26 26  GLUCOSE 109* 100*  BUN 8 7  CREATININE 0.87 0.77  CALCIUM 9.1 7.8*   LFT Recent Labs    12/16/21 0521  PROT 5.6*  ALBUMIN 2.3*  AST 10*  ALT 9  ALKPHOS 56  BILITOT 0.4   PT/INR Recent Labs    12/15/21 1515  LABPROT 14.8  INR 1.2   Studies/Results: CT CHEST ABDOMEN PELVIS W CONTRAST  Result Date: 12/15/2021 CLINICAL DATA:  Sepsis. Getting iron infusions. Feeling sick for a month. EXAM: CT CHEST, ABDOMEN, AND PELVIS WITH CONTRAST TECHNIQUE: Multidetector CT imaging of the chest, abdomen and pelvis was performed following the standard protocol during bolus administration of intravenous contrast. RADIATION DOSE REDUCTION: This exam was performed according to the departmental dose-optimization program which includes automated exposure control, adjustment of the mA and/or kV according to patient size and/or use of iterative reconstruction technique. CONTRAST:  61m OMNIPAQUE IOHEXOL 300 MG/ML  SOLN COMPARISON:  CT abdomen pelvis November 13, 2021 FINDINGS: CT CHEST FINDINGS Cardiovascular: Normal  caliber thoracic aorta. No central pulmonary embolus on this nondedicated study. Normal size heart. No significant pericardial effusion/thickening. Mediastinum/Nodes: No suspicious thyroid nodule. No  pathologically enlarged mediastinal, hilar or axillary lymph nodes. The esophagus is grossly unremarkable. Lungs/Pleura: No suspicious pulmonary nodules or masses. No focal airspace consolidation. No pleural effusion. No pneumothorax. Musculoskeletal: Bilateral gynecomastia. No acute osseous abnormality. CT ABDOMEN PELVIS FINDINGS Hepatobiliary: No suspicious hepatic lesion. Gallbladder is unremarkable. No biliary ductal dilation. Pancreas: No pancreatic ductal dilation or evidence of acute inflammation. Spleen: No splenomegaly or focal splenic lesion. Adrenals/Urinary Tract: Bilateral adrenal glands appear normal. No hydronephrosis. Kidneys demonstrate symmetric enhancement. Urinary bladder is nondistended limiting evaluation. Stomach/Bowel: No radiopaque enteric contrast material was administered. Stomach is unremarkable for degree of distension. No pathologic dilation of small or large bowel. Normal appendix. Pancolonic inflammation is slightly worsened in comparison to prior. Vascular/Lymphatic: Normal caliber abdominal aorta. No pathologically enlarged abdominal or pelvic lymph nodes. Reproductive: Prostate is unremarkable. Other: No pneumoperitoneum. No portal venous gas. No walled off fluid collections. No significant abdominopelvic free fluid. Musculoskeletal: No acute osseous abnormality. IMPRESSION: Pancolonic inflammation is slightly worsened in comparison to prior, most consistent with a nonspecific infectious or inflammatory colitis. No portal venous gas and no evidence bowel obstruction or perforation. Electronically Signed   By: Dahlia Bailiff M.D.   On: 12/15/2021 17:16   DG Chest Port 1 View  Result Date: 12/15/2021 CLINICAL DATA:  Abnormal labs, fever, weakness EXAM: PORTABLE CHEST 1 VIEW  COMPARISON:  None Available. FINDINGS: Cardiac and mediastinal contours are within normal limits. No focal pulmonary opacity. No pleural effusion or pneumothorax. No acute osseous abnormality. IMPRESSION: No acute cardiopulmonary process. Electronically Signed   By: Merilyn Baba M.D.   On: 12/15/2021 15:42    IMPRESSION:  *21 year old male with greater than 2 months of progressive symptoms including nausea, vomiting, rectal bleeding, generalized abdominal pain, diarrhea.  Substantial amount of weight loss since symptoms began as well.  Evaluation thus far shows stool studies negative for infectious source.  Fecal calprotectin extremely elevated and CRP elevated.  Also has leukocytosis, low albumin levels, and progressive iron deficiency anemia.  Hemoglobin 6.6 g compared to 13.0 g one month ago.  Received 1 unit of packed red blood cells.  Two recent CT scans showing pancolonic inflammation that has been progressive.  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  PLAN: -Trend hemoglobin and transfuse further if needed. -Plan was for IV and iron infusions.  We will plan for that while here.  Pharmacy consult placed. -Is on Rocephin and Flagyl, will continue those empirically for now. -EGD and colonoscopy 7/27 with Dr. Hilarie Fredrickson.  Laban Emperor. Bubber Rothert  12/16/2021, 10:53 AM

## 2021-12-16 NOTE — Consult Note (Signed)
Referring Provider: Dr. Alcario Drought, [facility] Primary Care Physician:  Ma Hillock, DO Primary Gastroenterologist:  Dr. Bryan Lemma  Reason for Consultation: Colitis  HPI: Willie James is a 21 y.o. male recently referred to the Gastroenterology Clinic for evaluation of nausea/vomiting and rectal bleeding.  Was seen by Dr. Bryan Lemma on 12/09/2021.  Per his office note as follows:   "Started as generalized abdominal pain and BRBPR 2+ months ago. No diarrhea initially.   Was initially seen by [facility] for this issue on 10/20/2021. - Abdominal x-ray: Moderate stool in ascending/transverse colon - Started stool softener, steroid suppository, recommended increased hydration with subsequent bowel movements and improvement in abdominal pain   Follow-up with PCM on 11/02/2021 with lower abdominal pain/pelvic pain.  Was still having hematochezia and stools transitioning to loose - Normal CBC, TSH, iron panel - FOBT positive x3 - CRP elevated at 13.2 - Placed referral to GI   Was then seen in the ER with abdominal pain, nausea/vomiting, BRBPR on 11/13/2021.  Was tachycardic at 130 bpm, but exam otherwise benign. - WBC 12.9, H/H 13/37.6, PLT 463 - Iron 27, TIBC 253, sat 11% - Normal CMP - CRP 3.9 - CT A/P: Diffuse colonic wall thickening.  Normal stomach, small bowel.  No lymphadenopathy - Discharged with ciprofloxacin 500 mg bid x4 days  Today, he states diarrhea still present. Having multiple BM/day now.  Can have up to 3-4 BM per hour at times.  Can have 2-3 nocturnal BM some nights, and other nights no sxs. Still w/ nausea and episodic non-bloody emesis, but improved with Zofran.   Has lost 34# since sxs onset. Decreased PO intake due to post prandial diarrhea. No arthralgias, skin issues, eye issues.    No prior similar sxs. No prior EGD, Colonoscopy.    No known family history of CRC, GI malignancy, liver disease, pancreatic disease, or IBD."  Stool for GI pathogens was ordered and was  negative.  Fecal calprotectin extremely elevated at 1500.  Labs showed iron deficiency anemia.  Plan was for IV iron infusions and outpatient EGD and colonoscopy that are scheduled for early August.  Unfortunately symptoms progressed and he became febrile with a Tmax here 101.3.  Tachycardic as well.  Admitted for SIRS type picture related to findings of colitis on recent CT scans.  Started on Rocephin and Flagyl.  Blood cultures ordered and are negative thus far.  Hemoglobin down to 6.6 g this morning so a unit of packed red blood cells has been ordered.    CT scan of the chest, abdomen, pelvis with contrast on 7/25 showed the following:  IMPRESSION: Pancolonic inflammation is slightly worsened in comparison to prior, most consistent with a nonspecific infectious or inflammatory colitis.   No portal venous gas and no evidence bowel obstruction or perforation.   Mother is at bedside.  Past Medical History:  Diagnosis Date   Frequent headaches    Left ventricular hypertrophy by electrocardiogram 10/28/2015   echo after was normal.    Palpitations 10/30/2015   pt reports palpitaitons and chest discomfort. EKG (reported LVH) and then NORMAL echo completed.     Past Surgical History:  Procedure Laterality Date   NO PAST SURGERIES      Prior to Admission medications   Not on File    Current Facility-Administered Medications  Medication Dose Route Frequency Provider Last Rate Last Admin   acetaminophen (TYLENOL) tablet 650 mg  650 mg Oral Q6H PRN Etta Quill, DO  Or   acetaminophen (TYLENOL) suppository 650 mg  650 mg Rectal Q6H PRN Etta Quill, DO       cefTRIAXone (ROCEPHIN) 2 g in sodium chloride 0.9 % 100 mL IVPB  2 g Intravenous Q24H Jennette Kettle M, DO       [START ON 12/17/2021] ferrous sulfate tablet 325 mg  325 mg Oral Q breakfast Shelly Coss, MD       lactated ringers infusion   Intravenous Continuous Jeanell Sparrow, DO 100 mL/hr at 12/16/21 9924  Infusion Verify at 12/16/21 0943   metroNIDAZOLE (FLAGYL) IVPB 500 mg  500 mg Intravenous Q12H Etta Quill, DO 100 mL/hr at 12/16/21 2683 Infusion Verify at 12/16/21 0943   ondansetron (ZOFRAN) tablet 4 mg  4 mg Oral Q6H PRN Etta Quill, DO       Or   ondansetron [facility]) injection 4 mg  4 mg Intravenous Q6H PRN Etta Quill, DO       peg 3350 powder (MOVIPREP) kit 200 g  1 kit Oral Once Akyra Bouchie D, PA-C        Allergies as of 12/15/2021   (No Known Allergies)    Family History  Problem Relation Age of Onset   Hyperlipidemia Father    Arthritis Maternal Grandmother    Diabetes Maternal Grandmother    Hyperlipidemia Maternal Grandmother    Hypertension Maternal Grandmother    Hypertension Maternal Grandfather    Hyperlipidemia Maternal Grandfather    Prostate cancer Maternal Grandfather    Cervical cancer Paternal Grandmother    Hypertension Paternal Grandfather    Heart disease Paternal Grandfather    Colon cancer Neg Hx    Esophageal cancer Neg Hx     Social History   Socioeconomic History   Marital status: Single    Spouse name: Not on file   Number of children: Not on file   Years of education: Not on file   Highest education level: Some college, no degree  Occupational History   Not on file  Tobacco Use   Smoking status: Never   Smokeless tobacco: Never  Vaping Use   Vaping Use: Never used  Substance and Sexual Activity   Alcohol use: Never   Drug use: Never   Sexual activity: Never  Other Topics Concern   Not on file  Social History Narrative   Marital status/children/pets: Single.    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[employer])   Safety:      -Wears a bicycle helmet riding a bike: Yes     -smoke alarm in the home:Yes     - wears seatbelt: Yes     - Feels safe in their relationships: Yes   Social Determinants of Health   Financial Resource Strain: Low Risk  (11/02/2021)   Overall Financial Resource Strain (CARDIA)    Difficulty of  Paying Living Expenses: Not hard at all  Food Insecurity: No Food Insecurity (11/02/2021)   Hunger Vital Sign    Worried About Running Out of Food in the Last Year: Never true    Ran Out of Food in the Last Year: Never true  Transportation Needs: No Transportation Needs (11/02/2021)   PRAPARE - Hydrologist (Medical): No    Lack of Transportation (Non-Medical): No  Physical Activity: Unknown (11/02/2021)   Exercise Vital Sign    Days of Exercise per Week: 0 days    Minutes of Exercise per Session: Not on file  Stress: No Stress Concern Present (  11/02/2021)   [place]    Feeling of Stress : Not at all  Social Connections: Socially Isolated (11/02/2021)   Social Connection and Isolation Panel [NHANES]    Frequency of Communication with Friends and Family: Never    Frequency of Social Gatherings with Friends and Family: More than three times a week    Attends Religious Services: Never    Marine scientist or Organizations: No    Attends Music therapist: Not on file    Marital Status: Never married  Intimate Partner Violence: Not on file    Review of Systems: ROS is O/W negative except as mentioned in HPI.  Physical Exam: Vital signs in last 24 hours: Temp:  [98.3 F (36.8 C)-101.3 F (38.5 C)] 98.6 F (37 C) (07/26 0920) Pulse Rate:  [105-151] 105 (07/26 0920) Resp:  [11-34] 16 (07/26 0920) BP: (109-134)/(72-90) 117/75 (07/26 0920) SpO2:  [97 %-100 %] 99 % (07/26 0920) Weight:  [80.7 kg-81.6 kg] 80.7 kg (07/25 1458) Last BM Date : 12/16/21 General:  Alert, Well-developed, well-nourished, pleasant and cooperative in NAD; appears pale. Head:  Normocephalic and atraumatic. Eyes:  Sclera clear, no icterus.  Conjunctiva pale. Ears:  Normal auditory acuity. Mouth:  No deformity or lesions.   Lungs:  Clear throughout to auscultation.   No wheezes, crackles, or rhonchi.   Heart:  Tachy.  No murmurs noted. Abdomen:  Soft, non-distended.  BS present, hyperactive.  Minimal diffuse TTP. Rectal:  Will be done at the time of colonoscopy.  Msk:  Symmetrical without gross deformities. Pulses:  Normal pulses noted. Extremities:  Without clubbing or edema. Neurologic:  Alert and oriented x 4;  grossly normal neurologically. Skin:  Intact without significant lesions or rashes. Psych:  Alert and cooperative. Normal mood and affect.  Intake/Output from previous day: 07/25 0701 - 07/26 0700 In: 2550.7 [I.V.:350; IV Piggyback:2200.7] Out: -  Intake/Output this shift: Total I/O In: 410.4 [I.V.:36.8; Blood:355; IV Piggyback:18.6] Out: -   Lab Results: Recent Labs    12/15/21 1515 12/16/21 0521  WBC 12.5* 9.5  HGB 8.2* 6.6*  HCT 25.0* 21.1*  PLT 657* 477*   BMET Recent Labs    12/15/21 1515 12/16/21 0521  NA 135 135  K 3.1* 3.5  CL 95* 102  CO2 26 26  GLUCOSE 109* 100*  BUN 8 7  CREATININE 0.87 0.77  CALCIUM 9.1 7.8*   LFT Recent Labs    12/16/21 0521  PROT 5.6*  ALBUMIN 2.3*  AST 10*  ALT 9  ALKPHOS 56  BILITOT 0.4   PT/INR Recent Labs    12/15/21 1515  LABPROT 14.8  INR 1.2   Studies/Results: CT CHEST ABDOMEN PELVIS W CONTRAST  Result Date: 12/15/2021 CLINICAL DATA:  Sepsis. Getting iron infusions. Feeling sick for a month. EXAM: CT CHEST, ABDOMEN, AND PELVIS WITH CONTRAST TECHNIQUE: Multidetector CT imaging of the chest, abdomen and pelvis was performed following the standard protocol during bolus administration of intravenous contrast. RADIATION DOSE REDUCTION: This exam was performed according to the departmental dose-optimization program which includes automated exposure control, adjustment of the mA and/or kV according to patient size and/or use of iterative reconstruction technique. CONTRAST:  61m OMNIPAQUE IOHEXOL 300 MG/ML  SOLN COMPARISON:  CT abdomen pelvis November 13, 2021 FINDINGS: CT CHEST FINDINGS Cardiovascular: Normal  caliber thoracic aorta. No central pulmonary embolus on this nondedicated study. Normal size heart. No significant pericardial effusion/thickening. Mediastinum/Nodes: No suspicious thyroid nodule. No  pathologically enlarged mediastinal, hilar or axillary lymph nodes. The esophagus is grossly unremarkable. Lungs/Pleura: No suspicious pulmonary nodules or masses. No focal airspace consolidation. No pleural effusion. No pneumothorax. Musculoskeletal: Bilateral gynecomastia. No acute osseous abnormality. CT ABDOMEN PELVIS FINDINGS Hepatobiliary: No suspicious hepatic lesion. Gallbladder is unremarkable. No biliary ductal dilation. Pancreas: No pancreatic ductal dilation or evidence of acute inflammation. Spleen: No splenomegaly or focal splenic lesion. Adrenals/Urinary Tract: Bilateral adrenal glands appear normal. No hydronephrosis. Kidneys demonstrate symmetric enhancement. Urinary bladder is nondistended limiting evaluation. Stomach/Bowel: No radiopaque enteric contrast material was administered. Stomach is unremarkable for degree of distension. No pathologic dilation of small or large bowel. Normal appendix. Pancolonic inflammation is slightly worsened in comparison to prior. Vascular/Lymphatic: Normal caliber abdominal aorta. No pathologically enlarged abdominal or pelvic lymph nodes. Reproductive: Prostate is unremarkable. Other: No pneumoperitoneum. No portal venous gas. No walled off fluid collections. No significant abdominopelvic free fluid. Musculoskeletal: No acute osseous abnormality. IMPRESSION: Pancolonic inflammation is slightly worsened in comparison to prior, most consistent with a nonspecific infectious or inflammatory colitis. No portal venous gas and no evidence bowel obstruction or perforation. Electronically Signed   By: Dahlia Bailiff M.D.   On: 12/15/2021 17:16   DG Chest Port 1 View  Result Date: 12/15/2021 CLINICAL DATA:  Abnormal labs, fever, weakness EXAM: PORTABLE CHEST 1 VIEW  COMPARISON:  None Available. FINDINGS: Cardiac and mediastinal contours are within normal limits. No focal pulmonary opacity. No pleural effusion or pneumothorax. No acute osseous abnormality. IMPRESSION: No acute cardiopulmonary process. Electronically Signed   By: Merilyn Baba M.D.   On: 12/15/2021 15:42    IMPRESSION:  *21 year old male with greater than 2 months of progressive symptoms including nausea, vomiting, rectal bleeding, generalized abdominal pain, diarrhea.  Substantial amount of weight loss since symptoms began as well.  Evaluation thus far shows stool studies negative for infectious source.  Fecal calprotectin extremely elevated and CRP elevated.  Also has leukocytosis, low albumin levels, and progressive iron deficiency anemia.  Hemoglobin 6.6 g compared to 13.0 g one month ago.  Received 1 unit of packed red blood cells.  Two recent CT scans showing pancolonic inflammation that has been progressive.  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  PLAN: -Trend hemoglobin and transfuse further if needed. -Plan was for IV and iron infusions.  We will plan for that while here.  Pharmacy consult placed. -Is on Rocephin and Flagyl, will continue those empirically for now. -EGD and colonoscopy 7/27 with Dr. Hilarie Fredrickson.  Willie James. Willie James  12/16/2021, 10:53 AM

## 2021-12-16 NOTE — Progress Notes (Signed)
  Transition of Care [facility]) Screening Note   Patient Details  Name: Victorhugo Preis Date of Birth: 03/05/2001   Transition of Care [facility]) CM/SW Contact:    Otelia Santee, LCSW Phone Number: 12/16/2021, 3:17 PM    Transition of Care Department [facility]) has reviewed patient and no TOC needs have been identified at this time. We will continue to monitor patient advancement through interdisciplinary progression rounds. If new patient transition needs arise, please place a TOC consult.

## 2021-12-17 ENCOUNTER — Inpatient Hospital Stay (HOSPITAL_COMMUNITY): Payer: 59 | Admitting: Certified Registered Nurse Anesthetist

## 2021-12-17 ENCOUNTER — Encounter (HOSPITAL_COMMUNITY)
Admission: EM | Disposition: A | Discharge status: Home / Self Care | Payer: Self-pay | Source: Home / Self Care | Attending: Internal Medicine

## 2021-12-17 ENCOUNTER — Encounter (HOSPITAL_COMMUNITY): Payer: Self-pay | Admitting: Internal Medicine

## 2021-12-17 ENCOUNTER — Telehealth: Payer: Self-pay

## 2021-12-17 DIAGNOSIS — K279 Peptic ulcer, site unspecified, unspecified as acute or chronic, without hemorrhage or perforation: Secondary | ICD-10-CM

## 2021-12-17 DIAGNOSIS — K5100 Ulcerative (chronic) pancolitis without complications: Principal | ICD-10-CM

## 2021-12-17 DIAGNOSIS — K529 Noninfective gastroenteritis and colitis, unspecified: Secondary | ICD-10-CM

## 2021-12-17 DIAGNOSIS — R634 Abnormal weight loss: Secondary | ICD-10-CM

## 2021-12-17 DIAGNOSIS — K2980 Duodenitis without bleeding: Secondary | ICD-10-CM

## 2021-12-17 DIAGNOSIS — R112 Nausea with vomiting, unspecified: Secondary | ICD-10-CM

## 2021-12-17 DIAGNOSIS — R933 Abnormal findings on diagnostic imaging of other parts of digestive tract: Secondary | ICD-10-CM

## 2021-12-17 DIAGNOSIS — K625 Hemorrhage of anus and rectum: Secondary | ICD-10-CM

## 2021-12-17 DIAGNOSIS — K209 Esophagitis, unspecified without bleeding: Secondary | ICD-10-CM

## 2021-12-17 DIAGNOSIS — K2970 Gastritis, unspecified, without bleeding: Secondary | ICD-10-CM

## 2021-12-17 DIAGNOSIS — R1084 Generalized abdominal pain: Secondary | ICD-10-CM

## 2021-12-17 HISTORY — PX: BIOPSY: SHX5522

## 2021-12-17 HISTORY — PX: ESOPHAGOGASTRODUODENOSCOPY (EGD) WITH PROPOFOL: SHX5813

## 2021-12-17 HISTORY — PX: COLONOSCOPY: SHX5424

## 2021-12-17 LAB — TYPE AND SCREEN
ABO/RH(D): O POS
Antibody Screen: NEGATIVE
Unit division: 0

## 2021-12-17 LAB — BASIC METABOLIC PANEL
Anion gap: 8 (ref 5–15)
BUN: 5 mg/dL — ABNORMAL LOW (ref 6–20)
CO2: 24 mmol/L (ref 22–32)
Calcium: 8.5 mg/dL — ABNORMAL LOW (ref 8.9–10.3)
Chloride: 107 mmol/L (ref 98–111)
Creatinine, Ser: 0.75 mg/dL (ref 0.61–1.24)
GFR, Estimated: >60 mL/min (ref >60)
Glucose, Bld: 96 mg/dL (ref 70–99)
Potassium: 4.1 mmol/L (ref 3.5–5.1)
Sodium: 139 mmol/L (ref 135–145)

## 2021-12-17 LAB — BPAM RBC
Blood Product Expiration Date: 202308242359
ISSUE DATE / TIME: 202307260737
Unit Type and Rh: 5100

## 2021-12-17 LAB — CBC
HCT: 27.8 % — ABNORMAL LOW (ref 39.0–52.0)
HCT: 25.3 % — ABNORMAL LOW (ref 39.0–52.0)
Hemoglobin: 8.5 g/dL — ABNORMAL LOW (ref 13.0–17.0)
Hemoglobin: 8 g/dL — ABNORMAL LOW (ref 13.0–17.0)
MCH: 26.8 pg (ref 26.0–34.0)
MCH: 27.3 pg (ref 26.0–34.0)
MCHC: 30.6 g/dL (ref 30.0–36.0)
MCHC: 31.6 g/dL (ref 30.0–36.0)
MCV: 87.7 fL (ref 80.0–100.0)
MCV: 86.3 fL (ref 80.0–100.0)
Platelets: 583 10*3/uL — ABNORMAL HIGH (ref 150–400)
Platelets: 490 10*3/uL — ABNORMAL HIGH (ref 150–400)
RBC: 3.17 MIL/uL — ABNORMAL LOW (ref 4.22–5.81)
RBC: 2.93 MIL/uL — ABNORMAL LOW (ref 4.22–5.81)
RDW: 14.5 % (ref 11.5–15.5)
RDW: 14.4 % (ref 11.5–15.5)
WBC: 12 10*3/uL — ABNORMAL HIGH (ref 4.0–10.5)
WBC: 14.9 10*3/uL — ABNORMAL HIGH (ref 4.0–10.5)
nRBC: 0.4 % — ABNORMAL HIGH (ref 0.0–0.2)
nRBC: 0.3 % — ABNORMAL HIGH (ref 0.0–0.2)

## 2021-12-17 LAB — HEPATITIS B SURFACE ANTIGEN: Hepatitis B Surface Ag: NONREACTIVE

## 2021-12-17 SURGERY — ESOPHAGOGASTRODUODENOSCOPY (EGD) WITH PROPOFOL
Anesthesia: Monitor Anesthesia Care

## 2021-12-17 MED ORDER — PANTOPRAZOLE SODIUM 40 MG PO TBEC
40.0000 mg | DELAYED_RELEASE_TABLET | Freq: Every day | ORAL | Status: DC
  Administered 2021-12-17 – 2021-12-19 (×3): 40 mg via ORAL
  Filled 2021-12-17 (×3): qty 1

## 2021-12-17 MED ORDER — MESALAMINE 1.2 G PO TBEC
4.8000 g | DELAYED_RELEASE_TABLET | Freq: Every day | ORAL | Status: DC
  Administered 2021-12-17 – 2021-12-19 (×3): 4.8 g via ORAL
  Filled 2021-12-17 (×4): qty 4

## 2021-12-17 MED ORDER — PROPOFOL 500 MG/50ML IV EMUL
INTRAVENOUS | Status: DC | PRN
  Administered 2021-12-17: 125 ug/kg/min via INTRAVENOUS

## 2021-12-17 MED ORDER — PROPOFOL 10 MG/ML IV BOLUS
INTRAVENOUS | Status: AC
  Filled 2021-12-17: qty 20

## 2021-12-17 MED ORDER — DICYCLOMINE HCL 20 MG PO TABS: 20.0000 mg | ORAL_TABLET | Freq: Three times a day (TID) | ORAL | Status: DC | PRN

## 2021-12-17 MED ORDER — PROPOFOL 1000 MG/100ML IV EMUL
INTRAVENOUS | Status: AC
  Filled 2021-12-17: qty 100

## 2021-12-17 MED ORDER — SODIUM CHLORIDE 0.9 % IV SOLN: INTRAVENOUS | Status: DC

## 2021-12-17 MED ORDER — ONDANSETRON HCL 4 MG/2ML IJ SOLN
INTRAMUSCULAR | Status: DC | PRN
  Administered 2021-12-17: 4 mg via INTRAVENOUS

## 2021-12-17 MED ORDER — LIDOCAINE 2% (20 MG/ML) 5 ML SYRINGE
INTRAMUSCULAR | Status: DC | PRN
  Administered 2021-12-17: 80 mg via INTRAVENOUS

## 2021-12-17 MED ORDER — LACTATED RINGERS IV SOLN: INTRAVENOUS | Status: DC

## 2021-12-17 MED ORDER — PROPOFOL 10 MG/ML IV BOLUS
INTRAVENOUS | Status: DC | PRN
  Administered 2021-12-17: 20 mg via INTRAVENOUS
  Administered 2021-12-17: 30 mg via INTRAVENOUS
  Administered 2021-12-17: 10 mg via INTRAVENOUS
  Administered 2021-12-17 (×3): 30 mg via INTRAVENOUS

## 2021-12-17 SURGICAL SUPPLY — 25 items

## 2021-12-17 NOTE — Progress Notes (Signed)
PROGRESS NOTE  Willie James  SEG:[id number] DOB: September 05, 2000 DOA: 12/15/2021 PCP: [name], DO   Brief Narrative:  Patient is a 21 year old male with history of hematochezia, intermittent abdominal pain was referred to the emergency department after he was found to be febrile and tachycardic at GI clinic.  Abdominal CT done in the emergency department showed pancolitis.  GI following.  Hospital course also remarkable for anemia with hemoglobin dropping to the range of 6, requiring a unit of blood transfusion.  Plan for EGD/colonoscopy today.  High suspicion for inflammatory bowel disease, started on steroids.  Assessment & Plan:  Principal Problem:   Colitis Active Problems:   SIRS (systemic inflammatory response syndrome) (HCC)   ABLA (acute blood loss anemia)   Hematochezia   Pancolitis: Was seen in GI clinic for abdominal pain.  Abdominal CT significant for colitis.  Continue supportive care, pain management, IV fluids, antiemetics.  Abdomen pain much better now. GI pathogen panel, C. difficile testing recently done, negative. Likely inflammatory bowel disease.  GI planning for EGD/colonoscopy.  Started on Solu-Medrol  SIRS: Patient was febrile, tachycardic at GI clinic.  Likely associated with pancolitis, underlying inflammatory bowel disease.  Lab work showed leukocytosis, elevated lactate.  Currently stable.  Continue gentle IV fluids.  Currently on ceftriaxone, Flagyl.  Hematochezia/acute blood loss anemia/iron deficiency: Complaining of hematochezia since 1-2 month.  Hemoglobin dropped to the range of 6 from 8.  Given a unit of blood transfusion.  Lab work on 7/19 showed iron level of 11.  Given IV iron.  He is to continue oral iron on discharge.  Hemoglobin is stable in the range of 8 today.  Check CBC tomorrow.  Obesity: BMI of 31.5          DVT prophylaxis:SCDs Start: 12/15/21 2245     Code Status: Full Code  Family Communication: Discussed with mother at  bedside on 7/26  Patient status:Inpatient  Patient is from :Home  Anticipated discharge VP:[facility]  Estimated DC date:1-2 days, needs GI clearance   Consultants: GI  Procedures:None  Antimicrobials:  Anti-infectives (From admission, onward)    Start     Dose/Rate Route Frequency Ordered Stop   12/16/21 2200  cefTRIAXone (ROCEPHIN) 2 g in sodium chloride 0.9 % 100 mL IVPB        2 g 200 mL/hr over 30 Minutes Intravenous Every 24 hours 12/15/21 2225     12/16/21 0800  metroNIDAZOLE (FLAGYL) IVPB 500 mg        500 mg 100 mL/hr over 60 Minutes Intravenous Every 12 hours 12/15/21 2225     12/15/21 2315  cefTRIAXone (ROCEPHIN) 1 g in sodium chloride 0.9 % 100 mL IVPB        1 g 200 mL/hr over 30 Minutes Intravenous  Once 12/15/21 2225 12/16/21 0754   12/15/21 2045  cefTRIAXone (ROCEPHIN) 1 g in sodium chloride 0.9 % 100 mL IVPB        1 g 200 mL/hr over 30 Minutes Intravenous  Once 12/15/21 2030 12/15/21 2131   12/15/21 2045  metroNIDAZOLE (FLAGYL) IVPB 500 mg        500 mg 100 mL/hr over 60 Minutes Intravenous  Once 12/15/21 2030 12/16/21 0753       Subjective: Patient seen and examined at the bedside this morning.  Hemodynamically stable.  Comfortable, denies any abdominal pain today.  He had seen few traces of blood yesterday in the stool  Objective: Vitals:   12/16/21 0920 12/16/21 1533 12/16/21 2059 12/17/21  0445  BP: 117/75 123/75 123/80 112/76  Pulse: (!) 105 (!) 107 (!) 110 (!) 107  Resp: 16  19 16   Temp: 98.6 F (37 C) 99.2 F (37.3 C) 99.4 F (37.4 C) 98.5 F (36.9 C)  TempSrc: Oral Oral    SpO2: 99% 97% 95% 98%  Weight:      Height:        Intake/Output Summary (Last 24 hours) at 12/17/2021 1127 Last data filed at 12/17/2021 0815 Gross per 24 hour  Intake 912.73 ml  Output --  Net 912.73 ml   Filed Weights   12/15/21 1448 12/15/21 1458  Weight: 80.7 kg 80.7 kg    Examination:  General exam: Overall comfortable, not in distress,obese HEENT:  PERRL Respiratory system:  no wheezes or crackles  Cardiovascular system: S1 & S2 heard, RRR.  Gastrointestinal system: Abdomen is nondistended, soft and nontender. Central nervous system: Alert and oriented Extremities: No edema, no clubbing ,no cyanosis Skin: No rashes, no ulcers,no icterus     Data Reviewed: I have personally reviewed following labs and imaging studies  CBC: Recent Labs  Lab 12/15/21 1515 12/16/21 0521 12/16/21 1122 12/17/21 0824  WBC 12.5* 9.5  --  12.0*  NEUTROABS 8.0*  --   --   --   HGB 8.2* 6.6* 7.8* 8.5*  HCT 25.0* 21.1* 24.8* 27.8*  MCV 84.5 88.3  --  87.7  PLT 657* 477*  --  583*   Basic Metabolic Panel: Recent Labs  Lab 12/15/21 1515 12/16/21 0521 12/17/21 0824  NA 135 135 139  K 3.1* 3.5 4.1  CL 95* 102 107  CO2 26 26 24   GLUCOSE 109* 100* 96  BUN 8 7 5*  CREATININE 0.87 0.77 0.75  CALCIUM 9.1 7.8* 8.5*     Recent Results (from the past 240 hour(s))  Calprotectin, Fecal     Status: Abnormal   Collection Time: 12/14/21  4:11 PM   Specimen: Stool  Result Value Ref Range Status   Calprotectin, Fecal 1,500 (H) 0 - 120 ug/g Final    Comment: **Results verified by repeat testing** Concentration     Interpretation   Follow-Up < 5 - 50 ug/g     Normal           None >50 -120 ug/g     Borderline       Re-evaluate in 4-6 weeks     >120 ug/g     Abnormal         Repeat as clinically                                    indicated   Culture, blood (Routine x 2)     Status: None (Preliminary result)   Collection Time: 12/15/21  3:15 PM   Specimen: Left Antecubital; Blood  Result Value Ref Range Status   Specimen Description   Final    LEFT ANTECUBITAL Performed at [facility], [address] [place], [address] [place]    Special Requests   Final    BOTTLES DRAWN AEROBIC AND ANAEROBIC Blood Culture adequate volume Performed at [facility], [address], [place], [address] [place]    Culture    Final    NO GROWTH 2 DAYS Performed at [facility], [address], [place], [address] [place]    Report Status PENDING  Incomplete  Culture, blood (Routine x 2)  Status: None (Preliminary result)   Collection Time: 12/15/21  3:15 PM   Specimen: BLOOD LEFT ARM  Result Value Ref Range Status   Specimen Description   Final    BLOOD LEFT ARM Performed at [facility], [address] [place], [place] [address]    Special Requests   Final    BOTTLES DRAWN AEROBIC AND ANAEROBIC Blood Culture adequate volume Performed at [facility], [address] [place], [place] [address]    Culture   Final    NO GROWTH 2 DAYS Performed at [facility], [address], [place], [place] [address]    Report Status PENDING  Incomplete  Resp Panel by RT-PCR (Flu A&B, Covid) Anterior Nasal Swab     Status: None   Collection Time: 12/15/21  3:15 PM   Specimen: Anterior Nasal Swab  Result Value Ref Range Status   SARS Coronavirus 2 by RT PCR NEGATIVE NEGATIVE Final    Comment: (NOTE) SARS-CoV-2 target nucleic acids are NOT DETECTED.  The SARS-CoV-2 RNA is generally detectable in upper respiratory specimens during the acute phase of infection. The lowest concentration of SARS-CoV-2 viral copies this assay can detect is 138 copies/mL. A negative result does not preclude SARS-Cov-2 infection and should not be used as the sole basis for treatment or other patient management decisions. A negative result may occur with  improper specimen collection/handling, submission of specimen other than nasopharyngeal swab, presence of viral mutation(s) within the areas targeted by this assay, and inadequate number of viral copies(<138 copies/mL). A negative result must be combined with clinical observations, patient history, and epidemiological information. The expected result is Negative.  Fact Sheet for Patients:   BloggerCourse.com  Fact Sheet for Healthcare Providers:  SeriousBroker.it  This test is no t yet approved or cleared by the [place] FDA and  has been authorized for detection and/or diagnosis of SARS-CoV-2 by FDA under an Emergency Use Authorization (EUA). This EUA will remain  in effect (meaning this test can be used) for the duration of the COVID-19 declaration under Section 564(b)(1) of the Act, 21 U.S.C.section 360bbb-3(b)(1), unless the authorization is terminated  or revoked sooner.       Influenza A by PCR NEGATIVE NEGATIVE Final   Influenza B by PCR NEGATIVE NEGATIVE Final    Comment: (NOTE) The Xpert Xpress SARS-CoV-2/FLU/RSV plus assay is intended as an aid in the diagnosis of influenza from Nasopharyngeal swab specimens and should not be used as a sole basis for treatment. Nasal washings and aspirates are unacceptable for Xpert Xpress SARS-CoV-2/FLU/RSV testing.  Fact Sheet for Patients: BloggerCourse.com  Fact Sheet for Healthcare Providers: SeriousBroker.it  This test is not yet approved or cleared by the [place] FDA and has been authorized for detection and/or diagnosis of SARS-CoV-2 by FDA under an Emergency Use Authorization (EUA). This EUA will remain in effect (meaning this test can be used) for the duration of the COVID-19 declaration under Section 564(b)(1) of the Act, 21 U.S.C. section 360bbb-3(b)(1), unless the authorization is terminated or revoked.  Performed at [facility], [address] [place], [place] [address]      Radiology Studies: CT CHEST ABDOMEN PELVIS W CONTRAST  Result Date: 12/15/2021 CLINICAL DATA:  Sepsis. Getting iron infusions. Feeling sick for a month. EXAM: CT CHEST, ABDOMEN, AND PELVIS WITH CONTRAST TECHNIQUE: Multidetector CT imaging of the chest, abdomen and pelvis was performed following the  standard protocol during bolus administration of intravenous contrast. RADIATION DOSE REDUCTION: This  exam was performed according to the departmental dose-optimization program which includes automated exposure control, adjustment of the mA and/or kV according to patient size and/or use of iterative reconstruction technique. CONTRAST:  42mL OMNIPAQUE IOHEXOL 300 MG/ML  SOLN COMPARISON:  CT abdomen pelvis November 13, 2021 FINDINGS: CT CHEST FINDINGS Cardiovascular: Normal caliber thoracic aorta. No central pulmonary embolus on this nondedicated study. Normal size heart. No significant pericardial effusion/thickening. Mediastinum/Nodes: No suspicious thyroid nodule. No pathologically enlarged mediastinal, hilar or axillary lymph nodes. The esophagus is grossly unremarkable. Lungs/Pleura: No suspicious pulmonary nodules or masses. No focal airspace consolidation. No pleural effusion. No pneumothorax. Musculoskeletal: Bilateral gynecomastia. No acute osseous abnormality. CT ABDOMEN PELVIS FINDINGS Hepatobiliary: No suspicious hepatic lesion. Gallbladder is unremarkable. No biliary ductal dilation. Pancreas: No pancreatic ductal dilation or evidence of acute inflammation. Spleen: No splenomegaly or focal splenic lesion. Adrenals/Urinary Tract: Bilateral adrenal glands appear normal. No hydronephrosis. Kidneys demonstrate symmetric enhancement. Urinary bladder is nondistended limiting evaluation. Stomach/Bowel: No radiopaque enteric contrast material was administered. Stomach is unremarkable for degree of distension. No pathologic dilation of small or large bowel. Normal appendix. Pancolonic inflammation is slightly worsened in comparison to prior. Vascular/Lymphatic: Normal caliber abdominal aorta. No pathologically enlarged abdominal or pelvic lymph nodes. Reproductive: Prostate is unremarkable. Other: No pneumoperitoneum. No portal venous gas. No walled off fluid collections. No significant abdominopelvic free fluid.  Musculoskeletal: No acute osseous abnormality. IMPRESSION: Pancolonic inflammation is slightly worsened in comparison to prior, most consistent with a nonspecific infectious or inflammatory colitis. No portal venous gas and no evidence bowel obstruction or perforation. Electronically Signed   By: Maudry Mayhew M.D.   On: 12/15/2021 17:16   DG Chest Port 1 View  Result Date: 12/15/2021 CLINICAL DATA:  Abnormal labs, fever, weakness EXAM: PORTABLE CHEST 1 VIEW COMPARISON:  None Available. FINDINGS: Cardiac and mediastinal contours are within normal limits. No focal pulmonary opacity. No pleural effusion or pneumothorax. No acute osseous abnormality. IMPRESSION: No acute cardiopulmonary process. Electronically Signed   By: Wiliam Ke M.D.   On: 12/15/2021 15:42    Scheduled Meds:  methylPREDNISolone (SOLU-MEDROL) injection  40 mg Intravenous Daily   Continuous Infusions:  sodium chloride 75 mL/hr at 12/16/21 1757   cefTRIAXone (ROCEPHIN)  IV 2 g (12/16/21 2216)   ferric gluconate (FERRLECIT) IVPB Stopped (12/16/21 1621)   metronidazole 500 mg (12/17/21 0901)     LOS: 2 days   Burnadette Pop, MD [facility] P7/27/2023, 11:27 AM

## 2021-12-17 NOTE — Op Note (Signed)
[facility] Patient Name: Willie James Procedure Date: 12/17/2021 MRN: [id number] Attending MD: Beverley Fiedler , MD Date of Birth: Sep 29, 2000 CSN: [id number] Age: 21 Admit Type: Outpatient Procedure:                Upper GI endoscopy Indications:              Generalized abdominal pain, Nausea with vomiting,                            loss of weight Providers:                Carie Caddy. Rhea Belton, MD, Lorenza Evangelist, RN, Kandice Robinsons, Technician Referring MD:             [facility] Medicines:                Monitored Anesthesia Care Complications:            No immediate complications. Estimated Blood Loss:     Estimated blood loss was minimal. Procedure:                Pre-Anesthesia Assessment:                           - Prior to the procedure, a History and Physical                            was performed, and patient medications and                            allergies were reviewed. The patient's tolerance of                            previous anesthesia was also reviewed. The risks                            and benefits of the procedure and the sedation                            options and risks were discussed with the patient.                            All questions were answered, and informed consent                            was obtained. Prior Anticoagulants: The patient has                            taken no previous anticoagulant or antiplatelet                            agents. ASA Grade Assessment: II - A patient with  mild systemic disease. After reviewing the risks                            and benefits, the patient was deemed in                            satisfactory condition to undergo the procedure.                           After obtaining informed consent, the endoscope was                            passed under direct vision. Throughout the                             procedure, the patient's blood pressure, pulse, and                            oxygen saturations were monitored continuously. The                            [id number]) Olympus endoscope was introduced                            through the mouth, and advanced to the second part                            of duodenum. The upper GI endoscopy was                            accomplished without difficulty. The patient                            tolerated the procedure well. Scope In: Scope Out: Findings:      LA Grade A (one or more mucosal breaks less than 5 mm, not extending       between tops of 2 mucosal folds) esophagitis was found at the       gastroesophageal junction. Z-line regular at 40 cm.      The cardia and gastric fundus were normal on retroflexion.      Mild inflammation characterized by erythema and granularity was found in       the gastric body and in the gastric antrum. Biopsies were taken with a       cold forceps for histology and Helicobacter pylori testing.      Diffuse moderate inflammation characterized by congestion (edema),       erythema and granularity was found in the duodenal bulb.      The second portion of the duodenum was normal. Biopsies for histology       were taken with a cold forceps for evaluation of celiac disease. Impression:               - LA Grade A reflux esophagitis (likely resultant  from recent vomiting).                           - Mild gastritis (scattered in gastric body and                            diffuse in antrum). Biopsied.                           - Duodenitis confined to bulb.                           - Normal second portion of the duodenum. Biopsied. Moderate Sedation:      N/A Recommendation:           - Return patient to hospital ward for ongoing care.                           - Advance diet as tolerated.                           - Continue present medications.                            - Daily PPI for now.                           - Await pathology results.                           - See the other procedure note for documentation of                            additional recommendations. Procedure Code(s):        --- Professional ---                           [phone], Esophagogastroduodenoscopy, flexible,                            transoral; with biopsy, single or multiple Diagnosis Code(s):        --- Professional ---                           K21.00, Gastro-esophageal reflux disease with                            esophagitis, without bleeding                           K29.70, Gastritis, unspecified, without bleeding                           K29.80, Duodenitis without bleeding                           R10.84, Generalized abdominal pain  R11.2, Nausea with vomiting, unspecified CPT copyright 2019 American Medical Association. All rights reserved. The codes documented in this report are preliminary and upon coder review may  be revised to meet current compliance requirements. Beverley Fiedler, MD 12/17/2021 3:26:56 PM This report has been signed electronically. Number of Addenda: 0

## 2021-12-17 NOTE — Interval H&P Note (Signed)
History and Physical Interval Note: For upper endoscopy and colonoscopy today to evaluate nausea, vomiting, bloody diarrhea and abnormal CT showing colitis The nature of the procedure, as well as the risks, benefits, and alternatives were carefully and thoroughly reviewed with the patient. Ample time for discussion and questions allowed. The patient understood, was satisfied, and agreed to proceed.      Latest Ref Rng & Units 12/17/2021    8:24 AM 12/16/2021   11:22 AM 12/16/2021    5:21 AM  CBC  WBC 4.0 - 10.5 K/uL 12.0   9.5   Hemoglobin 13.0 - 17.0 g/dL 8.5  7.8  6.6   Hematocrit 39.0 - 52.0 % 27.8  24.8  21.1   Platelets 150 - 400 K/uL 583   477      12/17/2021 2:25 PM  Willie James  has presented today for surgery, with the diagnosis of Colitis, diarrhea, nausea, vomiting, weight loss, rectal bleeding.  The various methods of treatment have been discussed with the patient and family. After consideration of risks, benefits and other options for treatment, the patient has consented to  Procedure(s): ESOPHAGOGASTRODUODENOSCOPY (EGD) WITH PROPOFOL (N/A) COLONOSCOPY WITH PROPOFOL (N/A) as a surgical intervention.  The patient's history has been reviewed, patient examined, no change in status, stable for surgery.  I have reviewed the patient's chart and labs.  Questions were answered to the patient's satisfaction.     Carie Caddy Kiven Vangilder

## 2021-12-17 NOTE — Progress Notes (Signed)
Pt completed 3/4 of prep.  Pt stated his BMs are clear/yellow.  Pt to be NPO until procedure.

## 2021-12-17 NOTE — Plan of Care (Signed)

## 2021-12-17 NOTE — Telephone Encounter (Signed)
Received message from Dr. Rhea Belton: this pt is hospitalized and was to have EGD/colon with VC on 12/28/21.  I did these in hospital and the procedures can be canceled.  he will need OV in 2-3 weeks with VC or APP.  Patient's upcoming procedures have been cancelled. Follow up scheduled for 01/11/22 at 3:00 pm with Dr. Barron Alvine. Appointment date will appear on discharge paperwork.

## 2021-12-17 NOTE — Transfer of Care (Signed)
Immediate Anesthesia Transfer of Care Note  Patient: Willie James  Procedure(s) Performed: ESOPHAGOGASTRODUODENOSCOPY (EGD) WITH PROPOFOL BIOPSY COLONOSCOPY WITH PROPOFOL  Patient Location: Endoscopy Unit  Anesthesia Type:MAC  Level of Consciousness: drowsy and patient cooperative  Airway & Oxygen Therapy: Patient Spontanous Breathing and Patient connected to face mask oxygen  Post-op Assessment: Report given to RN and Post -op Vital signs reviewed and stable  Post vital signs: Reviewed and stable  Last Vitals:  Vitals Value Taken Time  BP 125/81 12/17/21 1527  Temp    Pulse 98 12/17/21 1529  Resp 20 12/17/21 1529  SpO2 100 % 12/17/21 1529  Vitals shown include unvalidated device data.  Last Pain:  Vitals:   12/17/21 1338  TempSrc: Temporal  PainSc: 0-No pain         Complications: No notable events documented.

## 2021-12-17 NOTE — Op Note (Signed)
[facility] Patient Name: Willie James Procedure Date: 12/17/2021 MRN: [id number] Attending MD: Beverley Fiedler , MD Date of Birth: January 28, 2001 CSN: [id number] Age: 21 Admit Type: Outpatient Procedure:                Colonoscopy Indications:              Generalized abdominal pain, Hematochezia, Iron                            deficiency anemia secondary to chronic blood loss,                            Abnormal CT of the GI tract Providers:                Carie Caddy. Rhea Belton, MD, Lorenza Evangelist, RN, Kandice Robinsons, Technician Referring MD:             [facility] Medicines:                Monitored Anesthesia Care Complications:            No immediate complications. Estimated Blood Loss:     Estimated blood loss was minimal. Procedure:                Pre-Anesthesia Assessment:                           - Prior to the procedure, a History and Physical                            was performed, and patient medications and                            allergies were reviewed. The patient's tolerance of                            previous anesthesia was also reviewed. The risks                            and benefits of the procedure and the sedation                            options and risks were discussed with the patient.                            All questions were answered, and informed consent                            was obtained. Prior Anticoagulants: The patient has                            taken no previous anticoagulant or antiplatelet  agents. ASA Grade Assessment: II - A patient with                            mild systemic disease. After reviewing the risks                            and benefits, the patient was deemed in                            satisfactory condition to undergo the procedure.                           After obtaining informed consent, the colonoscope                             was passed under direct vision. Throughout the                            procedure, the patient's blood pressure, pulse, and                            oxygen saturations were monitored continuously. The                            [id number]) Olympus colonoscope was                            introduced through the anus and advanced to the                            terminal ileum. The colonoscopy was performed                            without difficulty. The patient tolerated the                            procedure well. The quality of the bowel                            preparation was good. The terminal ileum, ileocecal                            valve, appendiceal orifice, and rectum were                            photographed. Scope In: 3:08:04 PM Scope Out: 3:18:26 PM Scope Withdrawal Time: 0 hours 8 minutes 13 seconds  Total Procedure Duration: 0 hours 10 minutes 22 seconds  Findings:      The digital rectal exam was normal.      The terminal ileum appeared normal.      Inflammation characterized by altered vascularity, congestion (edema),       erythema, friability, granularity, loss of vascularity and mucus was       found in a continuous and circumferential pattern from the rectum to  the       cecum. No sites were spared. This was moderate in severity. Inflammation       more significant in rectum to transverse colon. Overall mild in       ascending colon/cecum at this time. Biopsies were taken with a cold       forceps for histology (Jar 3 = right colon/proximal transverse; Jar 4 =       left colon; Jar 5 = rectum).      Retroflexion in the rectum was not performed due to degree of proctitis       and edema. Impression:               - The examined portion of the ileum was normal.                           - Pancolitis most consistent with new diagnosis                            Ulcerative colitis. Inflammation was found from the                             rectum to the cecum. This was moderate in severity                            (see above). Biopsied. Moderate Sedation:      N/A Recommendation:           - Return patient to hospital ward for ongoing care.                           - Advance diet as tolerated.                           - Continue IV methylprednisolone 40 mg daily with                            plans to convert to oral prednisone when medically                            appropriate. Plan would be 40 mg PO daily x 7 days,                            35 mg PO x 7 days and decreasing by 5 mg every week                            until off.                           - Await pathology results.                           - Begin Lialda 4.8 g daily.                           - Outpatient follow-up with Dr. Barron Alvine to  assess clinical disease activity and discuss the                            need for additional therapies for UC. Procedure Code(s):        --- Professional ---                           [phone], Colonoscopy, flexible; with biopsy, single                            or multiple Diagnosis Code(s):        --- Professional ---                           K51.00, Ulcerative (chronic) pancolitis without                            complications                           R10.84, Generalized abdominal pain                           K92.1, Melena (includes Hematochezia)                           D50.0, Iron deficiency anemia secondary to blood                            loss (chronic)                           R93.3, Abnormal findings on diagnostic imaging of                            other parts of digestive tract CPT copyright 2019 American Medical Association. All rights reserved. The codes documented in this report are preliminary and upon coder review may  be revised to meet current compliance requirements. Jerene Bears, MD 12/17/2021 3:34:02 PM This report has been signed  electronically. Number of Addenda: 0

## 2021-12-17 NOTE — Anesthesia Preprocedure Evaluation (Signed)
Anesthesia Evaluation  Patient identified by MRN, date of birth, ID band Patient awake    Reviewed: Allergy & Precautions, NPO status , Patient's Chart, lab work & pertinent test results  Airway Mallampati: II  TM Distance: >3 FB Neck ROM: Full    Dental   Pulmonary neg pulmonary ROS,    breath sounds clear to auscultation       Cardiovascular negative cardio ROS   Rhythm:Regular Rate:Normal     Neuro/Psych negative neurological ROS     GI/Hepatic Neg liver ROS, PUD,   Endo/Other  negative endocrine ROS  Renal/GU negative Renal ROS     Musculoskeletal   Abdominal   Peds  Hematology  (+) Blood dyscrasia, anemia ,   Anesthesia Other Findings   Reproductive/Obstetrics                             Anesthesia Physical Anesthesia Plan  ASA: 3  Anesthesia Plan: MAC   Post-op Pain Management: Minimal or no pain anticipated   Induction:   PONV Risk Score and Plan: 1 and Propofol infusion and Treatment may vary due to age or medical condition  Airway Management Planned: Simple Face Mask, Natural Airway and Nasal Cannula  Additional Equipment: None  Intra-op Plan:   Post-operative Plan:   Informed Consent: I have reviewed the patients History and Physical, chart, labs and discussed the procedure including the risks, benefits and alternatives for the proposed anesthesia with the patient or authorized representative who has indicated his/her understanding and acceptance.       Plan Discussed with:   Anesthesia Plan Comments:         Anesthesia Quick Evaluation

## 2021-12-18 ENCOUNTER — Encounter (HOSPITAL_COMMUNITY): Payer: Self-pay | Admitting: Internal Medicine

## 2021-12-18 ENCOUNTER — Encounter: Payer: Self-pay | Admitting: Internal Medicine

## 2021-12-18 DIAGNOSIS — K209 Esophagitis, unspecified without bleeding: Secondary | ICD-10-CM

## 2021-12-18 DIAGNOSIS — K529 Noninfective gastroenteritis and colitis, unspecified: Secondary | ICD-10-CM | POA: Diagnosis not present

## 2021-12-18 LAB — CBC
HCT: 26.3 % — ABNORMAL LOW (ref 39.0–52.0)
Hemoglobin: 8.1 g/dL — ABNORMAL LOW (ref 13.0–17.0)
MCH: 26.8 pg (ref 26.0–34.0)
MCHC: 30.8 g/dL (ref 30.0–36.0)
MCV: 87.1 fL (ref 80.0–100.0)
Platelets: 532 10*3/uL — ABNORMAL HIGH (ref 150–400)
RBC: 3.02 MIL/uL — ABNORMAL LOW (ref 4.22–5.81)
RDW: 14.5 % (ref 11.5–15.5)
WBC: 15.9 10*3/uL — ABNORMAL HIGH (ref 4.0–10.5)
nRBC: 0.3 % — ABNORMAL HIGH (ref 0.0–0.2)

## 2021-12-18 MED ORDER — GUAIFENESIN-DM 100-10 MG/5ML PO SYRP
5.0000 mL | ORAL_SOLUTION | ORAL | Status: DC | PRN
  Administered 2021-12-19: 5 mL via ORAL
  Filled 2021-12-18: qty 5

## 2021-12-18 MED ORDER — PREDNISONE 20 MG PO TABS
40.0000 mg | ORAL_TABLET | Freq: Every day | ORAL | Status: DC
  Administered 2021-12-19: 40 mg via ORAL
  Filled 2021-12-18: qty 2

## 2021-12-18 NOTE — Progress Notes (Signed)
PROGRESS NOTE  Willie James  IBB:[id number] DOB: 2001-03-17 DOA: 12/15/2021 PCP: [name], DO   Brief Narrative:  Patient is a 21 year old male with history of hematochezia, intermittent abdominal pain was referred to the emergency department after he was found to be febrile and tachycardic at GI clinic.  Abdominal CT done in the emergency department showed pancolitis.  GI following.  Hospital course also remarkable for anemia with hemoglobin dropping to the range of 6, requiring a unit of blood transfusion.  Underwent  EGD/colonoscopy today with suspicion for UC, started on steroids.  Assessment & Plan:  Principal Problem:   Colitis Active Problems:   SIRS (systemic inflammatory response syndrome) (HCC)   ABLA (acute blood loss anemia)   Hematochezia   Nausea and vomiting   Generalized abdominal pain   Acute esophagitis   Gastritis and gastroduodenitis   Abnormal CT scan, colon   Pancolitis: Was seen in GI clinic for abdominal pain.  Abdominal CT significant for colitis.Abdomen pain much better now. GI pathogen panel, C. difficile testing recently done, negative. Colonoscopy showed features of ulcerative colitis, biopsy pending.  Started on Solu-Medrol.  Plan to change to oral steroids tomorrow  Esophagitis/duodenitis: As seen on EGD.  Currently on Protonix  SIRS: Patient was febrile, tachycardic at GI clinic.  Likely associated with pancolitis, underlying inflammatory bowel disease.  Lab work showed leukocytosis, elevated lactate.  Currently stable.  Antibiotics discontinued  Hematochezia/acute blood loss anemia/iron deficiency: Complaining of hematochezia since 1-2 month.  Hemoglobin dropped to the range of 6 from 8.  Given a unit of blood transfusion.  Lab work on 7/19 showed iron level of 11.  Given IV iron.  He is to continue oral iron on discharge.  Hemoglobin is stable in the range of 8 today.  Leukocytosis most likely from steroids.  Obesity: BMI of 31.5           DVT prophylaxis:SCDs Start: 12/15/21 2245     Code Status: Full Code  Family Communication: Discussed with mother at bedside on 7/28  Patient status:Inpatient  Patient is from :Home  Anticipated discharge IH:WTUU  Estimated DC date:1-2 days, needs GI clearance   Consultants: GI  Procedures:None  Antimicrobials:  Anti-infectives (From admission, onward)    Start     Dose/Rate Route Frequency Ordered Stop   12/16/21 2200  cefTRIAXone (ROCEPHIN) 2 g in sodium chloride 0.9 % 100 mL IVPB  Status:  Discontinued        2 g 200 mL/hr over 30 Minutes Intravenous Every 24 hours 12/15/21 2225 12/17/21 1536   12/16/21 0800  metroNIDAZOLE (FLAGYL) IVPB 500 mg  Status:  Discontinued        500 mg 100 mL/hr over 60 Minutes Intravenous Every 12 hours 12/15/21 2225 12/17/21 1536   12/15/21 2315  cefTRIAXone (ROCEPHIN) 1 g in sodium chloride 0.9 % 100 mL IVPB        1 g 200 mL/hr over 30 Minutes Intravenous  Once 12/15/21 2225 12/16/21 0754   12/15/21 2045  cefTRIAXone (ROCEPHIN) 1 g in sodium chloride 0.9 % 100 mL IVPB        1 g 200 mL/hr over 30 Minutes Intravenous  Once 12/15/21 2030 12/15/21 2131   12/15/21 2045  metroNIDAZOLE (FLAGYL) IVPB 500 mg        500 mg 100 mL/hr over 60 Minutes Intravenous  Once 12/15/21 2030 12/16/21 0753       Subjective: Patient seen and examined at the bedside this morning.  Hemodynamically stable  denies any pain during my evaluation.  Had bowel movement earlier with small amount of blood.  Objective: Vitals:   12/17/21 1600 12/17/21 1619 12/17/21 2248 12/18/21 0553  BP: (!) 124/97 120/77 125/79 120/83  Pulse: 96 95 (!) 101 62  Resp: 20 18 20 17   Temp:  98.3 F (36.8 C) 99.1 F (37.3 C) 98.5 F (36.9 C)  TempSrc:  Oral Oral   SpO2: 98% 98% 97%   Weight:      Height:        Intake/Output Summary (Last 24 hours) at 12/18/2021 1325 Last data filed at 12/18/2021 0759 Gross per 24 hour  Intake 1206.66 ml  Output --  Net 1206.66 ml    Filed Weights   12/15/21 1448 12/15/21 1458 12/17/21 1338  Weight: 80.7 kg 80.7 kg 80.7 kg    Examination:  General exam: Overall comfortable, not in distress,obese HEENT: PERRL Respiratory system:  no wheezes or crackles  Cardiovascular system: S1 & S2 heard, RRR.  Gastrointestinal system: Abdomen is nondistended, soft and nontender. Central nervous system: Alert and oriented Extremities: No edema, no clubbing ,no cyanosis Skin: No rashes, no ulcers,no icterus     Data Reviewed: I have personally reviewed following labs and imaging studies  CBC: Recent Labs  Lab 12/15/21 1515 12/16/21 0521 12/16/21 1122 12/17/21 0824 12/17/21 2257 12/18/21 0531  WBC 12.5* 9.5  --  12.0* 14.9* 15.9*  NEUTROABS 8.0*  --   --   --   --   --   HGB 8.2* 6.6* 7.8* 8.5* 8.0* 8.1*  HCT 25.0* 21.1* 24.8* 27.8* 25.3* 26.3*  MCV 84.5 88.3  --  87.7 86.3 87.1  PLT 657* 477*  --  583* 490* 532*   Basic Metabolic Panel: Recent Labs  Lab 12/15/21 1515 12/16/21 0521 12/17/21 0824  NA 135 135 139  K 3.1* 3.5 4.1  CL 95* 102 107  CO2 26 26 24   GLUCOSE 109* 100* 96  BUN 8 7 5*  CREATININE 0.87 0.77 0.75  CALCIUM 9.1 7.8* 8.5*     Recent Results (from the past 240 hour(s))  Calprotectin, Fecal     Status: Abnormal   Collection Time: 12/14/21  4:11 PM   Specimen: Stool  Result Value Ref Range Status   Calprotectin, Fecal 1,500 (H) 0 - 120 ug/g Final    Comment: **Results verified by repeat testing** Concentration     Interpretation   Follow-Up < 5 - 50 ug/g     Normal           None >50 -120 ug/g     Borderline       Re-evaluate in 4-6 weeks     >120 ug/g     Abnormal         Repeat as clinically                                    indicated   Culture, blood (Routine x 2)     Status: None (Preliminary result)   Collection Time: 12/15/21  3:15 PM   Specimen: Left Antecubital; Blood  Result Value Ref Range Status   Specimen Description   Final    LEFT ANTECUBITAL Performed at [facility], [address] [place], [address] [place]    Special Requests   Final    BOTTLES DRAWN AEROBIC AND ANAEROBIC Blood Culture adequate volume Performed at [facility],  [address], [place], [place] [address]    Culture   Final    NO GROWTH 3 DAYS Performed at [facility], [place] [address], [place], [place] [address]    Report Status PENDING  Incomplete  Culture, blood (Routine x 2)     Status: None (Preliminary result)   Collection Time: 12/15/21  3:15 PM   Specimen: BLOOD LEFT ARM  Result Value Ref Range Status   Specimen Description   Final    BLOOD LEFT ARM Performed at [facility], [address], [place], [place] [address]    Special Requests   Final    BOTTLES DRAWN AEROBIC AND ANAEROBIC Blood Culture adequate volume Performed at [facility], [address] [place], [place] [address]    Culture   Final    NO GROWTH 3 DAYS Performed at [facility], [place] [address] [place], [place] [address]    Report Status PENDING  Incomplete  Resp Panel by RT-PCR (Flu A&B, Covid) Anterior Nasal Swab     Status: None   Collection Time: 12/15/21  3:15 PM   Specimen: Anterior Nasal Swab  Result Value Ref Range Status   SARS Coronavirus 2 by RT PCR NEGATIVE NEGATIVE Final    Comment: (NOTE) SARS-CoV-2 target nucleic acids are NOT DETECTED.  The SARS-CoV-2 RNA is generally detectable in upper respiratory specimens during the acute phase of infection. The lowest concentration of SARS-CoV-2 viral copies this assay can detect is 138 copies/mL. A negative result does not preclude SARS-Cov-2 infection and should not be used as the sole basis for treatment or other patient management decisions. A negative result may occur with  improper specimen collection/handling, submission of specimen other than nasopharyngeal swab, presence of viral mutation(s) within the areas targeted by this  assay, and inadequate number of viral copies(<138 copies/mL). A negative result must be combined with clinical observations, patient history, and epidemiological information. The expected result is Negative.  Fact Sheet for Patients:  EntrepreneurPulse.com.au  Fact Sheet for Healthcare Providers:  IncredibleEmployment.be  This test is no t yet approved or cleared by the [place] FDA and  has been authorized for detection and/or diagnosis of SARS-CoV-2 by FDA under an Emergency Use Authorization (EUA). This EUA will remain  in effect (meaning this test can be used) for the duration of the COVID-19 declaration under Section 564(b)(1) of the Act, 21 U.S.C.section 360bbb-3(b)(1), unless the authorization is terminated  or revoked sooner.       Influenza A by PCR NEGATIVE NEGATIVE Final   Influenza B by PCR NEGATIVE NEGATIVE Final    Comment: (NOTE) The Xpert Xpress SARS-CoV-2/FLU/RSV plus assay is intended as an aid in the diagnosis of influenza from Nasopharyngeal swab specimens and should not be used as a sole basis for treatment. Nasal washings and aspirates are unacceptable for Xpert Xpress SARS-CoV-2/FLU/RSV testing.  Fact Sheet for Patients: EntrepreneurPulse.com.au  Fact Sheet for Healthcare Providers: IncredibleEmployment.be  This test is not yet approved or cleared by the [place] FDA and has been authorized for detection and/or diagnosis of SARS-CoV-2 by FDA under an Emergency Use Authorization (EUA). This EUA will remain in effect (meaning this test can be used) for the duration of the COVID-19 declaration under Section 564(b)(1) of the Act, 21 U.S.C. section 360bbb-3(b)(1), unless the authorization is terminated or revoked.  Performed at [facility], [address] [place], [place] [address]      Radiology Studies: No results found.  Scheduled Meds:  mesalamine  4.8 g Oral Q breakfast   methylPREDNISolone (SOLU-MEDROL) injection  40 mg Intravenous Daily   pantoprazole  40 mg Oral Q0600   Continuous Infusions:     LOS: 3 days   Shelly Coss, MD [facility] P7/28/2023, 1:25 PM

## 2021-12-18 NOTE — Progress Notes (Signed)
Daily Progress Note  Hospital Day: 4  Chief Complaint:  abdominal pain, blood in stool, diarrhea  Brief History Willie James is a 21 y.o. male who was undergoing workup in our office for N/V, IDA and rectal bleeding.  In June a CT scan showed diffuse colonic wall thickening.IBD suspected, EGD / colonoscopy scheduled but in interim he was admitted with progressive symptoms. Repeat CTscan this admission with persistent pancolitis.    Assessment   # Pan Ulcerative colitis, newly diagnosed.  Biopsies pending Will probably require Biologic tx. Hepatitis B surface Ag negative. Quantiferon Gold pending.  Continue Lialda 4.8 grams daily  Continue IV methylprednisolone 40 mg daily ( on day 3). Will convert to PO tomorrow if doing okay. Plan will be 40 mg PO daily x 7 days, 35 mg PO x 7 days and decreasing by 5 mg every week until off. He has an outpatient follow-up with Dr. Barron Alvine next month. t   # Iron deficiency anemia.  Received 1 u PRBC  plus IV iron Hgb has improved to 8.1 ( up from 6.6)  Mild esophagitis / duodenitis. Biopsies pending Continue daily pantoprazole  Subjective   Only one BM so far today. It was solid but still bleeding.   Objective   Endoscopic studies:   Colonoscopy  - The examined portion of the ileum was normal. - Pancolitis most consistent with new diagnosis Ulcerative colitis. Inflammation was found from the rectum to the cecum. This was moderate in severity (see above). Biopsied.  EGD  - LA Grade A reflux esophagitis (likely resultant from recent vomiting). - Mild gastritis (scattered in gastric body and diffuse in antrum). Biopsied. - Duodenitis confined to bulb.  Imaging:  CT CHEST ABDOMEN PELVIS W CONTRAST  Result Date: 12/15/2021 CLINICAL DATA:  Sepsis. Getting iron infusions. Feeling sick for a month. EXAM: CT CHEST, ABDOMEN, AND PELVIS WITH CONTRAST TECHNIQUE: Multidetector CT imaging of the chest, abdomen and pelvis was performed  following the standard protocol during bolus administration of intravenous contrast. RADIATION DOSE REDUCTION: This exam was performed according to the departmental dose-optimization program which includes automated exposure control, adjustment of the mA and/or kV according to patient size and/or use of iterative reconstruction technique. CONTRAST:  55mL OMNIPAQUE IOHEXOL 300 MG/ML  SOLN COMPARISON:  CT abdomen pelvis November 13, 2021 FINDINGS: CT CHEST FINDINGS Cardiovascular: Normal caliber thoracic aorta. No central pulmonary embolus on this nondedicated study. Normal size heart. No significant pericardial effusion/thickening. Mediastinum/Nodes: No suspicious thyroid nodule. No pathologically enlarged mediastinal, hilar or axillary lymph nodes. The esophagus is grossly unremarkable. Lungs/Pleura: No suspicious pulmonary nodules or masses. No focal airspace consolidation. No pleural effusion. No pneumothorax. Musculoskeletal: Bilateral gynecomastia. No acute osseous abnormality. CT ABDOMEN PELVIS FINDINGS Hepatobiliary: No suspicious hepatic lesion. Gallbladder is unremarkable. No biliary ductal dilation. Pancreas: No pancreatic ductal dilation or evidence of acute inflammation. Spleen: No splenomegaly or focal splenic lesion. Adrenals/Urinary Tract: Bilateral adrenal glands appear normal. No hydronephrosis. Kidneys demonstrate symmetric enhancement. Urinary bladder is nondistended limiting evaluation. Stomach/Bowel: No radiopaque enteric contrast material was administered. Stomach is unremarkable for degree of distension. No pathologic dilation of small or large bowel. Normal appendix. Pancolonic inflammation is slightly worsened in comparison to prior. Vascular/Lymphatic: Normal caliber abdominal aorta. No pathologically enlarged abdominal or pelvic lymph nodes. Reproductive: Prostate is unremarkable. Other: No pneumoperitoneum. No portal venous gas. No walled off fluid collections. No significant abdominopelvic  free fluid. Musculoskeletal: No acute osseous abnormality. IMPRESSION: Pancolonic inflammation is slightly worsened in comparison to prior,  most consistent with a nonspecific infectious or inflammatory colitis. No portal venous gas and no evidence bowel obstruction or perforation. Electronically Signed   By: Maudry Mayhew M.D.   On: 12/15/2021 17:16   DG Chest Port 1 View  Result Date: 12/15/2021 CLINICAL DATA:  Abnormal labs, fever, weakness EXAM: PORTABLE CHEST 1 VIEW COMPARISON:  None Available. FINDINGS: Cardiac and mediastinal contours are within normal limits. No focal pulmonary opacity. No pleural effusion or pneumothorax. No acute osseous abnormality. IMPRESSION: No acute cardiopulmonary process. Electronically Signed   By: Wiliam Ke M.D.   On: 12/15/2021 15:42    Lab Results: Recent Labs    12/17/21 0824 12/17/21 2257 12/18/21 0531  WBC 12.0* 14.9* 15.9*  HGB 8.5* 8.0* 8.1*  HCT 27.8* 25.3* 26.3*  PLT 583* 490* 532*   BMET Recent Labs    12/15/21 1515 12/16/21 0521 12/17/21 0824  NA 135 135 139  K 3.1* 3.5 4.1  CL 95* 102 107  CO2 26 26 24   GLUCOSE 109* 100* 96  BUN 8 7 5*  CREATININE 0.87 0.77 0.75  CALCIUM 9.1 7.8* 8.5*   LFT Recent Labs    12/16/21 0521  PROT 5.6*  ALBUMIN 2.3*  AST 10*  ALT 9  ALKPHOS 56  BILITOT 0.4   PT/INR Recent Labs    12/15/21 1515  LABPROT 14.8  INR 1.2     Scheduled inpatient medications:   mesalamine  4.8 g Oral Q breakfast   methylPREDNISolone (SOLU-MEDROL) injection  40 mg Intravenous Daily   pantoprazole  40 mg Oral Q0600   Continuous inpatient infusions:  PRN inpatient medications: acetaminophen **OR** acetaminophen, dicyclomine, ondansetron **OR** ondansetron (ZOFRAN) IV  Vital signs in last 24 hours: Temp:  [98.2 F (36.8 C)-99.1 F (37.3 C)] 98.5 F (36.9 C) (07/28 0553) Pulse Rate:  [62-105] 62 (07/28 0553) Resp:  [16-23] 17 (07/28 0553) BP: (120-136)/(77-97) 120/83 (07/28 0553) SpO2:  [97 %-100 %]  97 % (07/27 2248) Weight:  [80.7 kg] 80.7 kg (07/27 1338) Last BM Date : 12/18/21  Intake/Output Summary (Last 24 hours) at 12/18/2021 1018 Last data filed at 12/18/2021 0759 Gross per 24 hour  Intake 1419.24 ml  Output --  Net 1419.24 ml     Physical Exam:  General: Alert male in NAD Heart:  Regular rate and rhythm. No lower extremity edema Pulmonary: Normal respiratory effort Abdomen: Soft, nondistended, nontender. Normal bowel sounds.  Neurologic: Alert and oriented Psych: Pleasant. Cooperative.    Intake/Output from previous day: 07/27 0701 - 07/28 0700 In: 1567.2 [I.V.:1183; IV Piggyback:384.2] Out: -  Intake/Output this shift: Total I/O In: 240 [P.O.:240] Out: -     Principal Problem:   Colitis Active Problems:   Hematochezia   ABLA (acute blood loss anemia)   SIRS (systemic inflammatory response syndrome) (HCC)   Nausea and vomiting   Generalized abdominal pain   Acute esophagitis   Gastritis and gastroduodenitis   Abnormal CT scan, colon     LOS: 3 days   8/28 ,NP 12/18/2021, 10:18 AM

## 2021-12-18 NOTE — Plan of Care (Signed)

## 2021-12-19 ENCOUNTER — Other Ambulatory Visit (HOSPITAL_COMMUNITY): Payer: Self-pay

## 2021-12-19 ENCOUNTER — Encounter: Payer: Self-pay | Admitting: Gastroenterology

## 2021-12-19 DIAGNOSIS — D500 Iron deficiency anemia secondary to blood loss (chronic): Secondary | ICD-10-CM

## 2021-12-19 DIAGNOSIS — K51011 Ulcerative (chronic) pancolitis with rectal bleeding: Secondary | ICD-10-CM

## 2021-12-19 LAB — CBC
HCT: 25.2 % — ABNORMAL LOW (ref 39.0–52.0)
Hemoglobin: 7.8 g/dL — ABNORMAL LOW (ref 13.0–17.0)
MCH: 27.1 pg (ref 26.0–34.0)
MCHC: 31 g/dL (ref 30.0–36.0)
MCV: 87.5 fL (ref 80.0–100.0)
Platelets: 503 10*3/uL — ABNORMAL HIGH (ref 150–400)
RBC: 2.88 MIL/uL — ABNORMAL LOW (ref 4.22–5.81)
RDW: 14.6 % (ref 11.5–15.5)
WBC: 13.4 10*3/uL — ABNORMAL HIGH (ref 4.0–10.5)
nRBC: 1 % — ABNORMAL HIGH (ref 0.0–0.2)

## 2021-12-19 MED ORDER — DICYCLOMINE HCL 20 MG PO TABS
20.0000 mg | ORAL_TABLET | Freq: Three times a day (TID) | ORAL | 0 refills | Status: DC | PRN
  Filled 2021-12-19: qty 20, 7d supply, fill #0

## 2021-12-19 MED ORDER — PREDNISONE 5 MG PO TABS
40.0000 mg | ORAL_TABLET | Freq: Every day | ORAL | 0 refills | Status: DC
  Filled 2021-12-19: qty 182, 26d supply, fill #0
  Filled 2022-01-11: qty 118, 14d supply, fill #1

## 2021-12-19 MED ORDER — FERROUS SULFATE 325 (65 FE) MG PO TABS
325.0000 mg | ORAL_TABLET | Freq: Every day | ORAL | 1 refills | Status: DC
  Filled 2021-12-19: qty 30, 30d supply, fill #0

## 2021-12-19 MED ORDER — PANTOPRAZOLE SODIUM 40 MG PO TBEC
40.0000 mg | DELAYED_RELEASE_TABLET | Freq: Every day | ORAL | 1 refills | Status: DC
  Filled 2021-12-19: qty 30, 30d supply, fill #0

## 2021-12-19 MED ORDER — MESALAMINE 1.2 G PO TBEC
4.8000 g | DELAYED_RELEASE_TABLET | Freq: Every day | ORAL | 3 refills | Status: DC
  Filled 2021-12-19: qty 120, 30d supply, fill #0

## 2021-12-19 NOTE — Discharge Summary (Signed)
Physician Discharge Summary  Willie James MOQ:[id number] DOB: 2000/06/30 DOA: 12/15/2021  PCP: [name], DO  Admit date: 12/15/2021 Discharge date: 12/19/2021  Admitted From: Home Disposition:  Home  Discharge Condition:Stable CODE STATUS:FULL Diet recommendation: Regular   Brief/Interim Summary: Patient is a 21 year old male with history of hematochezia, intermittent abdominal pain was referred to the emergency department after he was found to be febrile and tachycardic at GI clinic.  Abdominal CT done in the emergency department showed pancolitis.  GI following.  Hospital course also remarkable for anemia with hemoglobin dropping to the range of 6, requiring a unit of blood transfusion.  Underwent  EGD/colonoscopy today with suspicion for UC, started on steroids.  GI cleared for discharge to home with oral steroids.  Medically stable for discharge.  Following problems were addressed during his hospitalization:  Pancolitis: Was seen in GI clinic for abdominal pain.  Abdominal CT significant for colitis.Abdomen pain much better now. GI pathogen panel, C. difficile testing recently done, negative. Colonoscopy showed features of ulcerative colitis, biopsy pending.  Started on Solu-Medrol.  Changed to oral steroids, he will continue on tapering dose and follow-up with GI as an outpatient.   Esophagitis/duodenitis: As seen on EGD.  Currently on Protonix   SIRS: Patient was febrile, tachycardic at GI clinic.  Likely associated with pancolitis, underlying inflammatory bowel disease.  Lab work showed leukocytosis, elevated lactate.  Currently stable.  Antibiotics discontinued   Hematochezia/acute blood loss anemia/iron deficiency: Complaining of hematochezia since 1-2 month.  Hemoglobin dropped to the range of 6 from 8.  Given a unit of blood transfusion.  Lab work on 7/19 showed iron level of 11.  Given IV iron.  He needs to continue oral iron on discharge.  Hemoglobin is stable in the  range of 7-8 .  Leukocytosis most likely from steroids. He needs to get checked for iron level as an outpatient, might need to continue IV infusion therapy   Obesity: BMI of 31.5    Discharge Diagnoses:  Principal Problem:   Ulcerative colitis (HCC) Active Problems:   SIRS (systemic inflammatory response syndrome) (HCC)   ABLA (acute blood loss anemia)   Hematochezia   Acute esophagitis   Gastritis and gastroduodenitis    Discharge Instructions  Discharge Instructions     Diet - low sodium heart healthy   Complete by: As directed    Discharge instructions   Complete by: As directed    1)Please take prescribed medications as instructed 2)Follow up with your PCP in a week 3)You will be called by gastroenterology for follow-up appointment.   Increase activity slowly   Complete by: As directed       Allergies as of 12/19/2021   No Known Allergies      Medication List     TAKE these medications    dicyclomine 20 MG tablet Commonly known as: BENTYL Take 1 tablet (20 mg total) by mouth 3 (three) times daily as needed for spasms (abd pain).   ferrous sulfate 325 (65 FE) MG tablet Take 1 tablet (325 mg total) by mouth daily.   mesalamine 1.2 g EC tablet Commonly known as: LIALDA Take 4 tablets (4.8 g total) by mouth daily with breakfast. Start taking on: December 20, 2021   pantoprazole 40 MG tablet Commonly known as: PROTONIX Take 1 tablet (40 mg total) by mouth daily at 6 (six) AM. Start taking on: December 20, 2021   predniSONE 5 MG tablet Commonly known as: DELTASONE Take 8 tablets (40 mg  total) by mouth daily with breakfast. Instruction: Take 8 pills a day for 5 days then 7 pills a day for 7 days then 6 pills a day for 7 days then 5 pills a day for 7 days then 4 pills a day for 7 days then 3 pills a day for 7 days then 2 pills a day for 7 days then 1 pill a day for 7 days then stop Start taking on: December 20, 2021        Follow-up Information     Kuneff, Renee  A, DO. Schedule an appointment as soon as possible for a visit in 1 week(s).   Specialty: Family Medicine Contact information: [address] [place] [address] [phone]                No Known Allergies  Consultations: GI   Procedures/Studies: CT CHEST ABDOMEN PELVIS W CONTRAST  Result Date: 12/15/2021 CLINICAL DATA:  Sepsis. Getting iron infusions. Feeling sick for a month. EXAM: CT CHEST, ABDOMEN, AND PELVIS WITH CONTRAST TECHNIQUE: Multidetector CT imaging of the chest, abdomen and pelvis was performed following the standard protocol during bolus administration of intravenous contrast. RADIATION DOSE REDUCTION: This exam was performed according to the departmental dose-optimization program which includes automated exposure control, adjustment of the mA and/or kV according to patient size and/or use of iterative reconstruction technique. CONTRAST:  85mL OMNIPAQUE IOHEXOL 300 MG/ML  SOLN COMPARISON:  CT abdomen pelvis November 13, 2021 FINDINGS: CT CHEST FINDINGS Cardiovascular: Normal caliber thoracic aorta. No central pulmonary embolus on this nondedicated study. Normal size heart. No significant pericardial effusion/thickening. Mediastinum/Nodes: No suspicious thyroid nodule. No pathologically enlarged mediastinal, hilar or axillary lymph nodes. The esophagus is grossly unremarkable. Lungs/Pleura: No suspicious pulmonary nodules or masses. No focal airspace consolidation. No pleural effusion. No pneumothorax. Musculoskeletal: Bilateral gynecomastia. No acute osseous abnormality. CT ABDOMEN PELVIS FINDINGS Hepatobiliary: No suspicious hepatic lesion. Gallbladder is unremarkable. No biliary ductal dilation. Pancreas: No pancreatic ductal dilation or evidence of acute inflammation. Spleen: No splenomegaly or focal splenic lesion. Adrenals/Urinary Tract: Bilateral adrenal glands appear normal. No hydronephrosis. Kidneys demonstrate symmetric enhancement. Urinary bladder is nondistended  limiting evaluation. Stomach/Bowel: No radiopaque enteric contrast material was administered. Stomach is unremarkable for degree of distension. No pathologic dilation of small or large bowel. Normal appendix. Pancolonic inflammation is slightly worsened in comparison to prior. Vascular/Lymphatic: Normal caliber abdominal aorta. No pathologically enlarged abdominal or pelvic lymph nodes. Reproductive: Prostate is unremarkable. Other: No pneumoperitoneum. No portal venous gas. No walled off fluid collections. No significant abdominopelvic free fluid. Musculoskeletal: No acute osseous abnormality. IMPRESSION: Pancolonic inflammation is slightly worsened in comparison to prior, most consistent with a nonspecific infectious or inflammatory colitis. No portal venous gas and no evidence bowel obstruction or perforation. Electronically Signed   By: Maudry Mayhew M.D.   On: 12/15/2021 17:16   DG Chest Port 1 View  Result Date: 12/15/2021 CLINICAL DATA:  Abnormal labs, fever, weakness EXAM: PORTABLE CHEST 1 VIEW COMPARISON:  None Available. FINDINGS: Cardiac and mediastinal contours are within normal limits. No focal pulmonary opacity. No pleural effusion or pneumothorax. No acute osseous abnormality. IMPRESSION: No acute cardiopulmonary process. Electronically Signed   By: Wiliam Ke M.D.   On: 12/15/2021 15:42      Subjective: Patient seen and examined at the bedside this morning.  Hemodynamically stable for discharge today.  Discharge Exam: Vitals:   12/18/21 2053 12/19/21 0547  BP: 121/83 119/72  Pulse: (!) 106 94  Resp: 15 17  Temp: 98.6 F (37 C) 97.9 F (36.6 C)  SpO2: 97% 100%   Vitals:   12/18/21 0553 12/18/21 1336 12/18/21 2053 12/19/21 0547  BP: 120/83 118/74 121/83 119/72  Pulse: 62 98 (!) 106 94  Resp: 17 16 15 17   Temp: 98.5 F (36.9 C) (!) 97.5 F (36.4 C) 98.6 F (37 C) 97.9 F (36.6 C)  TempSrc:   Oral Oral  SpO2:  98% 97% 100%  Weight:      Height:        General: Pt  is alert, awake, not in acute distress, obese Cardiovascular: RRR, S1/S2 +, no rubs, no gallops Respiratory: CTA bilaterally, no wheezing, no rhonchi Abdominal: Soft, NT, ND, bowel sounds + Extremities: no edema, no cyanosis    The results of significant diagnostics from this hospitalization (including imaging, microbiology, ancillary and laboratory) are listed below for reference.     Microbiology: Recent Results (from the past 240 hour(s))  Calprotectin, Fecal     Status: Abnormal   Collection Time: 12/14/21  4:11 PM   Specimen: Stool  Result Value Ref Range Status   Calprotectin, Fecal 1,500 (H) 0 - 120 ug/g Final    Comment: **Results verified by repeat testing** Concentration     Interpretation   Follow-Up < 5 - 50 ug/g     Normal           None >50 -120 ug/g     Borderline       Re-evaluate in 4-6 weeks     >120 ug/g     Abnormal         Repeat as clinically                                    indicated   Culture, blood (Routine x 2)     Status: None (Preliminary result)   Collection Time: 12/15/21  3:15 PM   Specimen: Left Antecubital; Blood  Result Value Ref Range Status   Specimen Description   Final    LEFT ANTECUBITAL Performed at [facility], [address], [place], [place]    Special Requests   Final    BOTTLES DRAWN AEROBIC AND ANAEROBIC Blood Culture adequate volume Performed at [facility], [address] [place], [place]    Culture   Final    NO GROWTH 4 DAYS Performed at [facility], [address] [place], [place]    Report Status PENDING  Incomplete  Culture, blood (Routine x 2)     Status: None (Preliminary result)   Collection Time: 12/15/21  3:15 PM   Specimen: BLOOD LEFT ARM  Result Value Ref Range Status   Specimen Description   Final    BLOOD LEFT ARM Performed at [facility], [address], [place], [place]    Special Requests    Final    BOTTLES DRAWN AEROBIC AND ANAEROBIC Blood Culture adequate volume Performed at [facility], [address] [place], [place]    Culture   Final    NO GROWTH 4 DAYS Performed at [facility], [address] [place], [place]    Report Status PENDING  Incomplete  Resp Panel by RT-PCR (Flu A&B, Covid) Anterior Nasal Swab     Status: None   Collection Time: 12/15/21  3:15 PM   Specimen: Anterior Nasal Swab  Result Value Ref Range Status   SARS Coronavirus 2 by RT PCR NEGATIVE NEGATIVE Final    Comment: (NOTE) SARS-CoV-2 target nucleic acids are NOT DETECTED.  The SARS-CoV-2 RNA is generally detectable in upper respiratory specimens during the acute phase of infection. The lowest concentration of SARS-CoV-2 viral copies this assay can detect is 138 copies/mL. A negative result does not preclude SARS-Cov-2 infection and should not be used as the sole basis for treatment or other patient management decisions. A negative result may occur with  improper specimen collection/handling, submission of specimen other than nasopharyngeal swab, presence of viral mutation(s) within the areas targeted by this assay, and inadequate number of viral copies(<138 copies/mL). A negative result must be combined with clinical observations, patient history, and epidemiological information. The expected result is Negative.  Fact Sheet for Patients:  BloggerCourse.com  Fact Sheet for Healthcare Providers:  SeriousBroker.it  This test is no t yet approved or cleared by the [place] FDA and  has been authorized for detection and/or diagnosis of SARS-CoV-2 by FDA under an Emergency Use Authorization (EUA). This EUA will remain  in effect (meaning this test can be used) for the duration of the COVID-19 declaration under Section 564(b)(1) of the Act, 21 U.S.C.section 360bbb-3(b)(1), unless the  authorization is terminated  or revoked sooner.       Influenza A by PCR NEGATIVE NEGATIVE Final   Influenza B by PCR NEGATIVE NEGATIVE Final    Comment: (NOTE) The Xpert Xpress SARS-CoV-2/FLU/RSV plus assay is intended as an aid in the diagnosis of influenza from Nasopharyngeal swab specimens and should not be used as a sole basis for treatment. Nasal washings and aspirates are unacceptable for Xpert Xpress SARS-CoV-2/FLU/RSV testing.  Fact Sheet for Patients: BloggerCourse.com  Fact Sheet for Healthcare Providers: SeriousBroker.it  This test is not yet approved or cleared by the [place] FDA and has been authorized for detection and/or diagnosis of SARS-CoV-2 by FDA under an Emergency Use Authorization (EUA). This EUA will remain in effect (meaning this test can be used) for the duration of the COVID-19 declaration under Section 564(b)(1) of the Act, 21 U.S.C. section 360bbb-3(b)(1), unless the authorization is terminated or revoked.  Performed at [facility], [address] [place], [place] [address]      Labs: BNP (last 3 results) No results for input(s): "BNP" in the last 8760 hours. Basic Metabolic Panel: Recent Labs  Lab 12/15/21 1515 12/16/21 0521 12/17/21 0824  NA 135 135 139  K 3.1* 3.5 4.1  CL 95* 102 107  CO2 26 26 24   GLUCOSE 109* 100* 96  BUN 8 7 5*  CREATININE 0.87 0.77 0.75  CALCIUM 9.1 7.8* 8.5*   Liver Function Tests: Recent Labs  Lab 12/15/21 1515 12/16/21 0521  AST 12* 10*  ALT 8 9  ALKPHOS 70 56  BILITOT 0.3 0.4  PROT 7.1 5.6*  ALBUMIN 3.5 2.3*   Recent Labs  Lab 12/15/21 1515  LIPASE 17   No results for input(s): "AMMONIA" in the last 168 hours. CBC: Recent Labs  Lab 12/15/21 1515 12/16/21 0521 12/16/21 1122 12/17/21 0824 12/17/21 2257 12/18/21 0531 12/19/21 0549  WBC 12.5* 9.5  --  12.0* 14.9* 15.9* 13.4*  NEUTROABS 8.0*  --   --   --   --    --   --   HGB 8.2* 6.6* 7.8* 8.5* 8.0* 8.1* 7.8*  HCT 25.0* 21.1* 24.8* 27.8* 25.3* 26.3* 25.2*  MCV 84.5 88.3  --  87.7 86.3  87.1 87.5  PLT 657* 477*  --  583* 490* 532* 503*   Cardiac Enzymes: No results for input(s): "CKTOTAL", "CKMB", "CKMBINDEX", "TROPONINI" in the last 168 hours. BNP: Invalid input(s): "POCBNP" CBG: No results for input(s): "GLUCAP" in the last 168 hours. D-Dimer No results for input(s): "DDIMER" in the last 72 hours. Hgb A1c No results for input(s): "HGBA1C" in the last 72 hours. Lipid Profile No results for input(s): "CHOL", "HDL", "LDLCALC", "TRIG", "CHOLHDL", "LDLDIRECT" in the last 72 hours. Thyroid function studies No results for input(s): "TSH", "T4TOTAL", "T3FREE", "THYROIDAB" in the last 72 hours.  Invalid input(s): "FREET3" Anemia work up No results for input(s): "VITAMINB12", "FOLATE", "FERRITIN", "TIBC", "IRON", "RETICCTPCT" in the last 72 hours. Urinalysis    Component Value Date/Time   COLORURINE YELLOW 12/15/2021 1515   APPEARANCEUR CLEAR 12/15/2021 1515   LABSPEC 1.032 (H) 12/15/2021 1515   PHURINE 6.0 12/15/2021 1515   GLUCOSEU NEGATIVE 12/15/2021 1515   HGBUR NEGATIVE 12/15/2021 1515   BILIRUBINUR NEGATIVE 12/15/2021 1515   KETONESUR NEGATIVE 12/15/2021 1515   PROTEINUR 30 (A) 12/15/2021 1515   NITRITE NEGATIVE 12/15/2021 1515   LEUKOCYTESUR NEGATIVE 12/15/2021 1515   Sepsis Labs Recent Labs  Lab 12/17/21 0824 12/17/21 2257 12/18/21 0531 12/19/21 0549  WBC 12.0* 14.9* 15.9* 13.4*   Microbiology Recent Results (from the past 240 hour(s))  Calprotectin, Fecal     Status: Abnormal   Collection Time: 12/14/21  4:11 PM   Specimen: Stool  Result Value Ref Range Status   Calprotectin, Fecal 1,500 (H) 0 - 120 ug/g Final    Comment: **Results verified by repeat testing** Concentration     Interpretation   Follow-Up < 5 - 50 ug/g     Normal           None >50 -120 ug/g     Borderline       Re-evaluate in 4-6 weeks     >120  ug/g     Abnormal         Repeat as clinically                                    indicated   Culture, blood (Routine x 2)     Status: None (Preliminary result)   Collection Time: 12/15/21  3:15 PM   Specimen: Left Antecubital; Blood  Result Value Ref Range Status   Specimen Description   Final    LEFT ANTECUBITAL Performed at [facility], [address], [place], [place] [address]    Special Requests   Final    BOTTLES DRAWN AEROBIC AND ANAEROBIC Blood Culture adequate volume Performed at [facility], [address], [place], [place] [address]    Culture   Final    NO GROWTH 4 DAYS Performed at [facility], [address] [place], [place] [address]    Report Status PENDING  Incomplete  Culture, blood (Routine x 2)     Status: None (Preliminary result)   Collection Time: 12/15/21  3:15 PM   Specimen: BLOOD LEFT ARM  Result Value Ref Range Status   Specimen Description   Final    BLOOD LEFT ARM Performed at [facility], [address], [place], [place] [address]    Special Requests   Final    BOTTLES DRAWN AEROBIC AND ANAEROBIC Blood Culture adequate volume Performed at [facility], [address], [place], [place]  [address]    Culture   Final    NO GROWTH 4 DAYS Performed at [facility], [address], [place], [place] [address]    Report Status PENDING  Incomplete  Resp Panel by RT-PCR (Flu A&B, Covid) Anterior Nasal Swab     Status: None   Collection Time: 12/15/21  3:15 PM   Specimen: Anterior Nasal Swab  Result Value Ref Range Status   SARS Coronavirus 2 by RT PCR NEGATIVE NEGATIVE Final    Comment: (NOTE) SARS-CoV-2 target nucleic acids are NOT DETECTED.  The SARS-CoV-2 RNA is generally detectable in upper respiratory specimens during the acute phase of infection. The lowest concentration of SARS-CoV-2 viral copies this assay can detect is 138 copies/mL. A negative  result does not preclude SARS-Cov-2 infection and should not be used as the sole basis for treatment or other patient management decisions. A negative result may occur with  improper specimen collection/handling, submission of specimen other than nasopharyngeal swab, presence of viral mutation(s) within the areas targeted by this assay, and inadequate number of viral copies(<138 copies/mL). A negative result must be combined with clinical observations, patient history, and epidemiological information. The expected result is Negative.  Fact Sheet for Patients:  BloggerCourse.com  Fact Sheet for Healthcare Providers:  SeriousBroker.it  This test is no t yet approved or cleared by the [place] FDA and  has been authorized for detection and/or diagnosis of SARS-CoV-2 by FDA under an Emergency Use Authorization (EUA). This EUA will remain  in effect (meaning this test can be used) for the duration of the COVID-19 declaration under Section 564(b)(1) of the Act, 21 U.S.C.section 360bbb-3(b)(1), unless the authorization is terminated  or revoked sooner.       Influenza A by PCR NEGATIVE NEGATIVE Final   Influenza B by PCR NEGATIVE NEGATIVE Final    Comment: (NOTE) The Xpert Xpress SARS-CoV-2/FLU/RSV plus assay is intended as an aid in the diagnosis of influenza from Nasopharyngeal swab specimens and should not be used as a sole basis for treatment. Nasal washings and aspirates are unacceptable for Xpert Xpress SARS-CoV-2/FLU/RSV testing.  Fact Sheet for Patients: BloggerCourse.com  Fact Sheet for Healthcare Providers: SeriousBroker.it  This test is not yet approved or cleared by the [place] FDA and has been authorized for detection and/or diagnosis of SARS-CoV-2 by FDA under an Emergency Use Authorization (EUA). This EUA will remain in effect (meaning this test can be used)  for the duration of the COVID-19 declaration under Section 564(b)(1) of the Act, 21 U.S.C. section 360bbb-3(b)(1), unless the authorization is terminated or revoked.  Performed at [facility], [address], [place], [place] [address]     Please note: You were cared for by a hospitalist during your hospital stay. Once you are discharged, your primary care physician will handle any further medical issues. Please note that NO REFILLS for any discharge medications will be authorized once you are discharged, as it is imperative that you return to your primary care physician (or establish a relationship with a primary care physician if you do not have one) for your post hospital discharge needs so that they can reassess your need for medications and monitor your lab values.    Time coordinating discharge: 40 minutes  SIGNED:   Burnadette Pop, MD  [facility] 12/19/2021, 11:18 AM Pager [phone]  If 7PM-7AM, please contact night-coverage [email or web address] Password [email or web address]

## 2021-12-19 NOTE — Plan of Care (Signed)
  Problem: Activity: Goal: Risk for activity intolerance will decrease Outcome: Progressing   Problem: Nutrition: Goal: Adequate nutrition will be maintained Outcome: Progressing   Problem: Elimination: Goal: Will not experience complications related to bowel motility Outcome: Progressing   

## 2021-12-19 NOTE — Progress Notes (Signed)
    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 pylori.   Recommendations   1.  UC --improving.  Transition to oral prednisone.  Continue 5-ASA.  Tolerating regular diet.  Hemoglobin stable. --Continue prednisone 40 mg daily, taper as previously stated --Discharge with Lialda 4.8 g daily --Can discharge with dicyclomine 20 mg 3 times daily as needed crampy abdominal pain --Follow-up in place --Okay for discharge today  2.  IDA --he has received IV iron.  He is scheduled for Venofer at the infusion center on Tuesday.  I will send a note to the infusion center to see if additional IV iron is needed based on what he has received in the hospital --Input from infusion center regarding need for additional IV iron --Okay to discharge on ferrous sulfate 325 mg once daily.  Patient advised that this can cause nausea, abdominal pain and constipation in some patients.  If this occurs he should let us know  3.  Esophagitis and gastroduodenitis --nonspecific and likely secondary to his pan ulcerative colitis causing nausea and vomiting.  This should heal --Pantoprazole 40 mg daily for 4 to 6 weeks   Chief Complaint   Patient feeling well this morning Tolerating diet Having more stool which continues to have blood but has more formed No abdominal pain  Vital signs in last 24 hours: Temp:  [97.5 F (36.4 C)-98.6 F (37 C)] 97.9 F (36.6 C) (07/29 0547) Pulse Rate:  [94-106] 94 (07/29 0547) Resp:  [15-17] 17 (07/29 0547) BP: (118-121)/(72-83) 119/72 (07/29 0547) SpO2:  [97 %-100 %] 100 % (07/29 0547) Last BM Date : 12/18/21  General: Alert, well-developed, in NAD Heart:  Regular rate and rhythm; no murmurs Chest: Clear to ascultation bilaterally Abdomen:  Soft, nontender and nondistended. Normal bowel sounds, without guarding, and without rebound.    Extremities:  Without edema. Neurologic:  Alert and  oriented x4; grossly normal neurologically. Psych:  Alert and cooperative. Normal mood and affect.  Intake/Output from previous day: 07/28 0701 - 07/29 0700 In: 720 [P.O.:720] Out: -  Intake/Output this shift: Total I/O In: 240 [P.O.:240] Out: -   Lab Results: Recent Labs    12/17/21 2257 12/18/21 0531 12/19/21 0549  WBC 14.9* 15.9* 13.4*  HGB 8.0* 8.1* 7.8*  HCT 25.3* 26.3* 25.2*  PLT 490* 532* 503*   BMET Recent Labs    12/17/21 0824  NA 139  K 4.1  CL 107  CO2 24  GLUCOSE 96  BUN 5*  CREATININE 0.75  CALCIUM 8.5*   Hepatitis Panel Recent Labs    12/17/21 0824  HEPBSAG NON REACTIVE    Studies/Results: No results found.    LOS: 4 days   Beverley Fiedler, MD 12/19/2021, 10:35 AM See Loretha Stapler, [facility], to contact our on call provider

## 2021-12-20 LAB — CULTURE, BLOOD (ROUTINE X 2)
Culture: NO GROWTH
Culture: NO GROWTH
Special Requests: ADEQUATE
Special Requests: ADEQUATE

## 2021-12-21 ENCOUNTER — Other Ambulatory Visit: Payer: Self-pay | Admitting: Pharmacy Technician

## 2021-12-21 ENCOUNTER — Telehealth: Payer: Self-pay

## 2021-12-21 NOTE — Telephone Encounter (Signed)
Transition Care Management Unsuccessful Follow-up Telephone Call  Date of discharge and from where:  12/19/21 [place]   Attempts:  1st Attempt  Reason for unsuccessful TCM follow-up call:  Unable to leave message

## 2021-12-22 ENCOUNTER — Ambulatory Visit (INDEPENDENT_AMBULATORY_CARE_PROVIDER_SITE_OTHER): Payer: 59

## 2021-12-22 ENCOUNTER — Telehealth: Payer: Self-pay

## 2021-12-22 VITALS — BP 125/71 | HR 104 | Temp 98.8°F | Resp 18 | Ht 63.0 in | Wt 182.6 lb

## 2021-12-22 DIAGNOSIS — K921 Melena: Secondary | ICD-10-CM | POA: Diagnosis not present

## 2021-12-22 DIAGNOSIS — D500 Iron deficiency anemia secondary to blood loss (chronic): Secondary | ICD-10-CM

## 2021-12-22 LAB — QUANTIFERON-TB GOLD PLUS (RQFGPL)
QuantiFERON Mitogen Value: 0.17 IU/mL
QuantiFERON Nil Value: 0 IU/mL
QuantiFERON TB1 Ag Value: 0 IU/mL
QuantiFERON TB2 Ag Value: 0 IU/mL

## 2021-12-22 LAB — QUANTIFERON-TB GOLD PLUS: QuantiFERON-TB Gold Plus: UNDETERMINED — AB

## 2021-12-22 LAB — SURGICAL PATHOLOGY

## 2021-12-22 MED ORDER — SODIUM CHLORIDE 0.9 % IV SOLN
200.0000 mg | Freq: Once | INTRAVENOUS | Status: AC
  Administered 2021-12-22: 200 mg via INTRAVENOUS
  Filled 2021-12-22: qty 10

## 2021-12-22 NOTE — Telephone Encounter (Signed)
-----   Message from [place], [facility] sent at 12/21/2021  9:15 AM EDT ----- Regarding: RE: IV iron Patient received 1 dose of Ferrlicit 250mg  as inpatient on 7/26. I would recommend 3 more infusions of venofer 200mg  to complete the course.   @Cathy /Salman Wellen - can you please coordinate this with patient. He will only need 3 more doses.  Thank you,     ----- Message ----- From: , MD Sent: 12/19/2021  10:42 AM EDT To: Smitty Cords, CPhT; Beverley Fiedler, DO; # Subject: IV iron                                        12/21/2021, This patient was scheduled for IV iron on 12/22/2021 at the infusion center. He ended up being admitted to [facility] long where ulcerative colitis was diagnosed. He received some amount of IV iron during this hospitalization. Can you please take a look at what he has received and let Dr. Ilona Sorrel and the patient know if he should continue additional doses of IV iron as an outpatient. Goal hemoglobin 14 g/dL If you could please let the patient know on Monday whether he should get additional doses of IV iron as an outpatient as scheduled on Tuesday we would appreciate it. Follow-up in place with [facility]  Thanks Barron Alvine. Pyrtle, M.D.  12/19/2021

## 2021-12-22 NOTE — Progress Notes (Signed)
Diagnosis: Iron Deficiency Anemia  Provider:  Chilton Greathouse, MD  Procedure: Infusion  IV Type: Peripheral, IV Location: L Forearm  Venofer (Iron Sucrose), Dose: 200 mg  Infusion Start Time: 1435  Infusion Stop Time: 1500  Post Infusion IV Care: Observation period completed and Peripheral IV Discontinued  Discharge: Condition: Good, Destination: Home . AVS provided to patient.   Performed by:  Loney Hering, LPN

## 2021-12-22 NOTE — Telephone Encounter (Signed)
Pt called to see if he needed to keep appointment today at 2:30 pm for iron since he received iron in the hospital. Confirmed with infusion center through secure chat that pt should keep appt today. Called pt back to let him know, pt verbalized understanding and had no other concerns at end of call.

## 2021-12-28 ENCOUNTER — Encounter: Payer: 59 | Admitting: Gastroenterology

## 2021-12-28 NOTE — Anesthesia Postprocedure Evaluation (Signed)
Anesthesia Post Note  Patient: Saivon Prowse  Procedure(s) Performed: ESOPHAGOGASTRODUODENOSCOPY (EGD) WITH PROPOFOL BIOPSY COLONOSCOPY     Patient location during evaluation: PACU Anesthesia Type: MAC Level of consciousness: awake and alert Pain management: pain level controlled Vital Signs Assessment: post-procedure vital signs reviewed and stable Respiratory status: spontaneous breathing, nonlabored ventilation, respiratory function stable and patient connected to nasal cannula oxygen Cardiovascular status: stable and blood pressure returned to baseline Postop Assessment: no apparent nausea or vomiting Anesthetic complications: no   No notable events documented.  Last Vitals:  Vitals:   12/18/21 2053 12/19/21 0547  BP: 121/83 119/72  Pulse: (!) 106 94  Resp: 15 17  Temp: 37 C 36.6 C  SpO2: 97% 100%    Last Pain:  Vitals:   12/19/21 1130  TempSrc:   PainSc: 0-No pain                 Tiajuana Amass

## 2021-12-29 ENCOUNTER — Ambulatory Visit (INDEPENDENT_AMBULATORY_CARE_PROVIDER_SITE_OTHER): Payer: 59

## 2021-12-29 VITALS — BP 130/81 | HR 116 | Temp 98.9°F | Resp 18 | Ht 63.0 in | Wt 178.6 lb

## 2021-12-29 DIAGNOSIS — K921 Melena: Secondary | ICD-10-CM | POA: Diagnosis not present

## 2021-12-29 DIAGNOSIS — D500 Iron deficiency anemia secondary to blood loss (chronic): Secondary | ICD-10-CM

## 2021-12-29 MED ORDER — SODIUM CHLORIDE 0.9 % IV SOLN
200.0000 mg | Freq: Once | INTRAVENOUS | Status: AC
  Administered 2021-12-29: 200 mg via INTRAVENOUS
  Filled 2021-12-29: qty 10

## 2021-12-29 NOTE — Progress Notes (Signed)
Diagnosis: Iron Deficiency Anemia  Provider:  Chilton Greathouse, MD  Procedure: Infusion  IV Type: Peripheral, IV Location: L Forearm  Venofer (Iron Sucrose), Dose: 200 mg  Infusion Start Time: 1440  Infusion Stop Time: 1457  Post Infusion IV Care: Peripheral IV Discontinued  Discharge: Condition: Good, Destination: Home . AVS provided to patient.   Performed by:  Loney Hering, LPN

## 2022-01-01 ENCOUNTER — Other Ambulatory Visit (HOSPITAL_COMMUNITY): Payer: Self-pay

## 2022-01-05 ENCOUNTER — Ambulatory Visit (INDEPENDENT_AMBULATORY_CARE_PROVIDER_SITE_OTHER): Payer: 59

## 2022-01-05 VITALS — BP 128/77 | HR 109 | Temp 99.8°F | Resp 18 | Ht 63.0 in | Wt 182.0 lb

## 2022-01-05 DIAGNOSIS — D500 Iron deficiency anemia secondary to blood loss (chronic): Secondary | ICD-10-CM | POA: Diagnosis not present

## 2022-01-05 DIAGNOSIS — K921 Melena: Secondary | ICD-10-CM | POA: Diagnosis not present

## 2022-01-05 MED ORDER — SODIUM CHLORIDE 0.9 % IV SOLN
200.0000 mg | Freq: Once | INTRAVENOUS | Status: AC
  Administered 2022-01-05: 200 mg via INTRAVENOUS
  Filled 2022-01-05: qty 10

## 2022-01-05 NOTE — Progress Notes (Signed)
Diagnosis: Iron Deficiency Anemia  Provider:  Chilton Greathouse MD  Procedure: Infusion  IV Type: Peripheral, IV Location: L Hand  Venofer (Iron Sucrose), Dose: 200 mg  Infusion Start Time: 1440  Infusion Stop Time: 1503  Post Infusion IV Care: Peripheral IV Discontinued  Discharge: Condition: Good, Destination: Home . AVS Declined  Performed by:  Garnette Czech, RN

## 2022-01-11 ENCOUNTER — Other Ambulatory Visit (INDEPENDENT_AMBULATORY_CARE_PROVIDER_SITE_OTHER): Payer: 59

## 2022-01-11 ENCOUNTER — Other Ambulatory Visit (HOSPITAL_COMMUNITY): Payer: Self-pay

## 2022-01-11 ENCOUNTER — Encounter: Payer: Self-pay | Admitting: Gastroenterology

## 2022-01-11 ENCOUNTER — Ambulatory Visit: Payer: 59 | Admitting: Gastroenterology

## 2022-01-11 VITALS — BP 110/81 | HR 136 | Ht 63.0 in | Wt 187.4 lb

## 2022-01-11 DIAGNOSIS — D500 Iron deficiency anemia secondary to blood loss (chronic): Secondary | ICD-10-CM

## 2022-01-11 DIAGNOSIS — K51919 Ulcerative colitis, unspecified with unspecified complications: Secondary | ICD-10-CM

## 2022-01-11 LAB — CBC WITH DIFFERENTIAL/PLATELET
Basophils Absolute: 0 10*3/uL (ref 0.0–0.1)
Basophils Relative: 0.1 % (ref 0.0–3.0)
Eosinophils Absolute: 0 10*3/uL (ref 0.0–0.7)
Eosinophils Relative: 0 % (ref 0.0–5.0)
HCT: 28.1 % — ABNORMAL LOW (ref 39.0–52.0)
Hemoglobin: 9.1 g/dL — ABNORMAL LOW (ref 13.0–17.0)
Lymphocytes Relative: 9.6 % — ABNORMAL LOW (ref 12.0–46.0)
Lymphs Abs: 0.8 10*3/uL (ref 0.7–4.0)
MCHC: 32.2 g/dL (ref 30.0–36.0)
MCV: 89.9 fl (ref 78.0–100.0)
Monocytes Absolute: 0.6 10*3/uL (ref 0.1–1.0)
Monocytes Relative: 6.7 % (ref 3.0–12.0)
Neutro Abs: 6.9 10*3/uL (ref 1.4–7.7)
Neutrophils Relative %: 83.6 % — ABNORMAL HIGH (ref 43.0–77.0)
Platelets: 408 10*3/uL — ABNORMAL HIGH (ref 150.0–400.0)
RBC: 3.12 Mil/uL — ABNORMAL LOW (ref 4.22–5.81)
RDW: 19.1 % — ABNORMAL HIGH (ref 11.5–15.5)
WBC: 8.3 10*3/uL (ref 4.0–10.5)

## 2022-01-11 LAB — COMPREHENSIVE METABOLIC PANEL
ALT: 14 U/L (ref 0–53)
AST: 10 U/L (ref 0–37)
Albumin: 4.1 g/dL (ref 3.5–5.2)
Alkaline Phosphatase: 59 U/L (ref 39–117)
BUN: 15 mg/dL (ref 6–23)
CO2: 25 mEq/L (ref 19–32)
Calcium: 8.9 mg/dL (ref 8.4–10.5)
Chloride: 101 mEq/L (ref 96–112)
Creatinine, Ser: 0.75 mg/dL (ref 0.40–1.50)
GFR: 127.7 mL/min (ref >60.00)
Glucose, Bld: 167 mg/dL — ABNORMAL HIGH (ref 70–99)
Potassium: 4 mEq/L (ref 3.5–5.1)
Sodium: 136 mEq/L (ref 135–145)
Total Bilirubin: 0.2 mg/dL (ref 0.2–1.2)
Total Protein: 7 g/dL (ref 6.0–8.3)

## 2022-01-11 MED ORDER — ZEPOSIA STARTER KIT 0.23MG &0.46MG 0.92MG(21) PO CPPK: ORAL_CAPSULE | ORAL | 0 refills | Status: AC

## 2022-01-11 NOTE — Patient Instructions (Addendum)
_______________________________________________________  If you are age 23 or older, your body mass index should be between 23-30. Your Body mass index is 33.2 kg/m. If this is out of the aforementioned range listed, please consider follow up with your Primary Care Provider.  If you are age 81 or younger, your body mass index should be between 19-25. Your Body mass index is 33.2 kg/m. If this is out of the aformentioned range listed, please consider follow up with your Primary Care Provider.   ________________________________________________________  The [facility] providers would like to encourage you to use [facility] to communicate with providers for non-urgent requests or questions.  Due to long hold times on the telephone, sending your provider a message by [facility] may be a faster and more efficient way to get a response.  Please allow 48 business hours for a response.  Please remember that this is for non-urgent requests.  _______________________________________________________  Your provider has requested that you go to the basement level for lab work before leaving today. Press "B" on the elevator. The lab is located at the first door on the left as you exit the elevator.  We have sent the following medications to your pharmacy for you to pick up at your convenience: Zeposia Day 1-4 take 0.23mg  a day  Day 5-7 take 0.46mg  a day and Day 8 and onward take 0.92mg  daily.   It was a pleasure to see you today!  Vito Cirigliano, D.O.

## 2022-01-11 NOTE — Progress Notes (Signed)
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  - Started having generalized abdominal pain and hematochezia in spring 2023 - 11/02/2021: Normal CBC, TSH, iron panel, FOBT positive, CRP 13.2 - 11/13/2021: ER evaluation. WBC 12.9, H/H 13/37.6, PLT 463, Iron 27, TIBC 253, sat 11%, normal CMP, CRP 3.9 - 11/13/2021: CT A/P: Diffuse colonic wall thickening.  Normal stomach, small bowel.  No lymphadenopathy - 12/09/2021: Initial appointment GI clinic.  WBC 13.5, H/H 9.8/29.4, ferritin 25, iron 11, TIBC 304, sat 3.6%.  Folate 8.2, normal B12.  Negative/normal GI PCR panel.  Calprotectin 1500 - 12/15/2021: Hospital admission with hematochezia, abdominal pain, symptomatic anemia, fever.  Treated with IV Solu-Medrol with improvement, IV iron.  QuantiFERON gold indeterminate. HBsAg- - 12/15/2021: CT A/P: Pancolonic inflammation slightly worse compared with prior study - 12/17/2021: EGD: LA Grade A esophagitis, mild gastritis, moderate duodenitis - 12/17/2021: Colonoscopy: Moderate pancolitis.  Normal TI  HPI:     Patient is a 21 y.o. male presenting to the Gastroenterology Clinic for hospital follow-up.  Was initially seen by me on 12/09/2021 for hematochezia, abdominal pain, elevated inflammatory markers, IDA.  High clinical suspicion for IBD and was scheduled for labs and expedited EGD/colonoscopy.  Unfortunately, was subsequently admitted 7/25-29 with hematochezia, abdominal pain, fever, tachycardia, anemia. - Hgb nadir 6.6 during admission, transfused 1 unit PRBCs - Started on IV Solu-Medrol and transition to prednisone 40 mg/day x7 days then start taper - Started on Lialda 4.8 g/day  Currently prednisone at 25 mg/day. Taking Lialda 4/8 gm/day. Received IV iron x3 since leaving the hospital.   Currently BM will range 2-3 or up to 5-6 small volume BM/day. Still hematochezia, but decreased volume overall. No nocturnal stools.   No n/v,  abdominal pain.   Review of systems:     No chest pain, no SOB, no fevers, no urinary sx   Past Medical History:  Diagnosis Date   Frequent headaches    Left ventricular hypertrophy by electrocardiogram 10/28/2015   echo after was normal.    Palpitations 10/30/2015   pt reports palpitaitons and chest discomfort. EKG (reported LVH) and then NORMAL echo completed.     Patient's surgical history, family medical history, social history, medications and allergies were all reviewed in Epic    Current Outpatient Medications  Medication Sig Dispense Refill   dicyclomine (BENTYL) 20 MG tablet Take 1 tablet (20 mg total) by mouth 3 (three) times daily as needed for spasms (abd pain). 20 tablet 0   ferrous sulfate 325 (65 FE) MG tablet Take 1 tablet (325 mg total) by mouth daily. 30 tablet 1   mesalamine (LIALDA) 1.2 g EC tablet Take 4 tablets (4.8 g total) by mouth daily with breakfast. 60 tablet 3   pantoprazole (PROTONIX) 40 MG tablet Take 1 tablet (40 mg total) by mouth daily at 6 (six) AM. 30 tablet 1   predniSONE (DELTASONE) 5 MG tablet Take 8 tablets by mouth daily for 5 days-- 7 tablets daily for 7 days then decrease by 1 tablet each week until finished 300 tablet 0   No current facility-administered medications for this visit.    Physical Exam:     BP 110/81   Pulse (!) 136   Ht 5\' 3"  (1.6 m)   Wt 187 lb 6.4 oz (85 kg)   SpO2 97%   BMI 33.20 kg/m   GENERAL:  Pleasant male in NAD PSYCH: : Cooperative, normal affect NEURO: Alert and  oriented x 3, no focal neurologic deficits   IMPRESSION and PLAN:    1) Ulcerative Colitis 21 year old male with recently diagnosed steroid responsive Ulcerative Pancolitis.  He has had a partial response to steroids and Lialda, but still with some persistent symptoms.  I recommend escalation of therapy based on moderately severe UC on colonoscopy, significantly elevated inflammatory markers, and partial response to an appropriate trial of  steroid/5-ASA therapy.  We discussed treatment options at length today, to include biologic agents, immunomodulators, continued 5-ASA, and he would ultimately like to start Zeposia. Plan for the following:  - Check CBC, LFTs prior to starting medication and periodically during medication course - Zeposia may increase BP.  Monitor periodically - EKG completed today via cardio mobile app which was notable for sinus tachycardia.  Additionally, reviewed recent inpatient EKG which confirmed no QT prolongation, arrhythmia, or bradycardia - No history of uveitis, macular degeneration, or diabetes to warrant ophthalmology referral - Check VZV antibody and plan for vaccine if antibody negative/no immunity.  If using live vaccine, administer 1 month prior to initiating Zeposia - No significant cardiovascular event in the last 6 months (MI, unstable angina, CVA, TIA, decompensated heart failure), class III/IV heart failure, Mobitz 2 or third-degree block, severe uncontrolled/untreated OSA, or MAOi prescription - Avoid live vaccines during treatment course - If it dose is missed in the initial 7 days, titration regimen should be restarted - Placed Rx for Zeposia with titration today.  Will initiate medication after completion of labs as above  2) Iron deficiency anemia - 2/2 UC - Received IV iron x4 doses - Recheck CBC today and if still uptrending, repeat CBC along with iron panel in 4-6 weeks   RTC in 2 months or sooner as needed     I spent 45 minutes of time, including in depth chart review, independent review of results as outlined above, communicating results with the patient directly, face-to-face time with the patient, coordinating care, ordering studies and medications as appropriate, and documentation.       Shellia Cleverly ,DO, FACG 01/11/2022, 3:17 PM

## 2022-01-12 ENCOUNTER — Other Ambulatory Visit (HOSPITAL_COMMUNITY): Payer: Self-pay

## 2022-01-12 ENCOUNTER — Telehealth: Payer: Self-pay | Admitting: Gastroenterology

## 2022-01-12 ENCOUNTER — Ambulatory Visit: Payer: 59

## 2022-01-12 ENCOUNTER — Telehealth: Payer: Self-pay

## 2022-01-12 LAB — VARICELLA ZOSTER ANTIBODY, IGG: Varicella IgG: 813.8 index

## 2022-01-12 NOTE — Telephone Encounter (Signed)
PT is returning call. Wants to know if its pertaining to results from appointment on 01/11/2022. Please advise.

## 2022-01-12 NOTE — Telephone Encounter (Signed)
Spoke with pt and gave pt results and recommendations. See 8/21 lab result note.

## 2022-01-12 NOTE — Telephone Encounter (Signed)
Can a PA be started for Zeposia for this patient? Please and thank you

## 2022-01-14 ENCOUNTER — Encounter: Payer: Self-pay | Admitting: Gastroenterology

## 2022-01-14 NOTE — Telephone Encounter (Signed)
Plan to stop the Lialda as soon as he starts Zeposia. Can otherwise continue weaning the Prednisone as planned, without change to that schedule.   No live vaccines 1 month prior to starting Zeposia, and no live vaccines at all after the medication has been started.   Once the medication is started, needs to be taken as prescribed. If any missed doses in the 1st two weeks, will need to restart the starter kit.

## 2022-01-15 ENCOUNTER — Telehealth: Payer: Self-pay

## 2022-01-15 ENCOUNTER — Other Ambulatory Visit (HOSPITAL_COMMUNITY): Payer: Self-pay

## 2022-01-15 DIAGNOSIS — K51919 Ulcerative colitis, unspecified with unspecified complications: Secondary | ICD-10-CM

## 2022-01-15 NOTE — Telephone Encounter (Signed)
Patient Advocate Encounter   Received notification that prior authorization is required for [employer].  Submitted: 01/15/2022 Key [id number]  Clista Bernhardt, CPhT Rx Patient Advocate Phone: [phone]

## 2022-01-18 ENCOUNTER — Other Ambulatory Visit (HOSPITAL_COMMUNITY): Payer: Self-pay

## 2022-01-21 ENCOUNTER — Other Ambulatory Visit (HOSPITAL_COMMUNITY): Payer: Self-pay

## 2022-01-21 ENCOUNTER — Encounter: Payer: Self-pay | Admitting: Gastroenterology

## 2022-01-27 NOTE — Telephone Encounter (Signed)
Hi Willie James,   Regarding this patient, I see that Willie James has submitted additional information to the insurance. Just for FYI if it is denied by his insurance we can fax denial to [phone]. So the patient can get 2 years of free drug program. Any step therapy can be ignored.

## 2022-01-27 NOTE — Telephone Encounter (Signed)
Hi Dr. Barron Alvine,   PA for Hassie Bruce is initiated for this patient by [employer] through our Prior Auth team. I was talking with Ashok Cordia today about Reuel Boom. She was saying if his PA denies, he is more likely to be eligible to get Zeposia for upto 2 years. I already mentioned this to PA team lead [place]. Can we start Saralyn Pilar for this patient? Thank you!

## 2022-01-28 NOTE — Telephone Encounter (Signed)
Spoke with the patient. Stated he can come tomorrow after 2:00 pm to pick up [employer]. Labs order already placed. Patient is aware. MyChart message was also sent.

## 2022-01-28 NOTE — Telephone Encounter (Signed)
Thank you for the update.  Yes, okay to start Zeposia starter kit. Check CBC and LFTs 4-6 weeks after initiating Zeposia, along with RTC in 2 months or sooner as needed

## 2022-01-29 NOTE — Telephone Encounter (Signed)
Per Ashok Cordia, we don't need to initiate separate PA for 0.92 MG Ziposia.

## 2022-01-29 NOTE — Telephone Encounter (Signed)
Patient came by to pick up Zeposia starter kit. Stated he is going to start from tomorrow. Patient is made aware again to get his labs after 4 to 6 weeks of therapy. Follow up appointment is also made with him on 03/30/22 at 1:40 pm. Patient verbalized understanding.

## 2022-02-03 ENCOUNTER — Telehealth: Payer: Self-pay | Admitting: Pharmacy Technician

## 2022-02-03 ENCOUNTER — Other Ambulatory Visit (HOSPITAL_COMMUNITY): Payer: Self-pay

## 2022-02-03 NOTE — Telephone Encounter (Signed)
Patient Advocate Encounter  Received notification that prior authorization for ZEPOSIA is required.   PA submitted on 9.13.23 VIA FAX FAX# [phone] Status is pending    Ricke Hey, CPhT Patient Advocate Phone: [phone]

## 2022-02-03 NOTE — Telephone Encounter (Signed)
PA has been submitted via fax and telephone encounter has been created

## 2022-02-05 ENCOUNTER — Other Ambulatory Visit (HOSPITAL_COMMUNITY): Payer: Self-pay

## 2022-02-05 ENCOUNTER — Other Ambulatory Visit: Payer: Self-pay

## 2022-02-05 NOTE — Telephone Encounter (Signed)
Patient Advocate Encounter  Prior Authorization for Hassie Bruce has been approved.    PA# [id number] DA Effective dates: 9.14.23 through 9.14.24  Yazmyne Sara B. CPhT P: [phone] F: [phone]

## 2022-02-09 ENCOUNTER — Telehealth: Payer: Self-pay

## 2022-02-09 ENCOUNTER — Other Ambulatory Visit: Payer: Self-pay

## 2022-02-09 DIAGNOSIS — D500 Iron deficiency anemia secondary to blood loss (chronic): Secondary | ICD-10-CM

## 2022-02-09 NOTE — Telephone Encounter (Signed)
Spoke with pt and let him know about labs. Pt verbalized understanding and had no other concerns at end of call.

## 2022-02-09 NOTE — Telephone Encounter (Signed)
Left message for pt to call back  °

## 2022-02-09 NOTE — Telephone Encounter (Signed)
-----   Message from Marice Potter, RN sent at 01/12/2022  2:50 PM EDT ----- Regarding: labs Pt needs repeat CBC and iron panel in 4 weeks. Approx 9/19. Need to enter orders.

## 2022-02-10 ENCOUNTER — Other Ambulatory Visit (INDEPENDENT_AMBULATORY_CARE_PROVIDER_SITE_OTHER): Payer: 59

## 2022-02-10 DIAGNOSIS — D500 Iron deficiency anemia secondary to blood loss (chronic): Secondary | ICD-10-CM

## 2022-02-10 DIAGNOSIS — K51919 Ulcerative colitis, unspecified with unspecified complications: Secondary | ICD-10-CM | POA: Diagnosis not present

## 2022-02-10 LAB — HEPATIC FUNCTION PANEL
ALT: 10 U/L (ref 0–53)
AST: 10 U/L (ref 0–37)
Albumin: 4.1 g/dL (ref 3.5–5.2)
Alkaline Phosphatase: 54 U/L (ref 39–117)
Bilirubin, Direct: 0.1 mg/dL (ref 0.0–0.3)
Total Bilirubin: 0.3 mg/dL (ref 0.2–1.2)
Total Protein: 8.1 g/dL (ref 6.0–8.3)

## 2022-02-10 LAB — CBC WITH DIFFERENTIAL/PLATELET
Basophils Absolute: 0 10*3/uL (ref 0.0–0.1)
Basophils Relative: 0.6 % (ref 0.0–3.0)
Eosinophils Absolute: 0.1 10*3/uL (ref 0.0–0.7)
Eosinophils Relative: 1 % (ref 0.0–5.0)
HCT: 25.8 % — ABNORMAL LOW (ref 39.0–52.0)
Hemoglobin: 8.1 g/dL — ABNORMAL LOW (ref 13.0–17.0)
Lymphocytes Relative: 3.9 % — ABNORMAL LOW (ref 12.0–46.0)
Lymphs Abs: 0.3 10*3/uL — ABNORMAL LOW (ref 0.7–4.0)
MCHC: 31.5 g/dL (ref 30.0–36.0)
MCV: 78.6 fl (ref 78.0–100.0)
Monocytes Absolute: 0.7 10*3/uL (ref 0.1–1.0)
Monocytes Relative: 10 % (ref 3.0–12.0)
Neutro Abs: 6.3 10*3/uL (ref 1.4–7.7)
Neutrophils Relative %: 84.5 % — ABNORMAL HIGH (ref 43.0–77.0)
Platelets: 501 10*3/uL — ABNORMAL HIGH (ref 150.0–400.0)
RBC: 3.28 Mil/uL — ABNORMAL LOW (ref 4.22–5.81)
RDW: 18.6 % — ABNORMAL HIGH (ref 11.5–15.5)
WBC: 7.5 10*3/uL (ref 4.0–10.5)

## 2022-02-10 LAB — IBC + FERRITIN
Ferritin: 9.1 ng/mL — ABNORMAL LOW (ref 22.0–322.0)
Iron: 18 ug/dL — ABNORMAL LOW (ref 42–165)
Saturation Ratios: 4.8 % — ABNORMAL LOW (ref 20.0–50.0)
TIBC: 375.2 ug/dL (ref 250.0–450.0)
Transferrin: 268 mg/dL (ref 212.0–360.0)

## 2022-02-11 ENCOUNTER — Other Ambulatory Visit: Payer: Self-pay

## 2022-02-11 ENCOUNTER — Telehealth: Payer: Self-pay

## 2022-02-11 LAB — POC SARS CORONAVIRUS 2 AG: Coronavirus Source: NEGATIVE

## 2022-02-11 MED ORDER — ZEPOSIA 0.92 MG PO CAPS: 1.0000 | ORAL_CAPSULE | Freq: Every day | ORAL | 3 refills | Status: DC

## 2022-02-11 NOTE — Telephone Encounter (Signed)
Patient is returning your call.  

## 2022-02-11 NOTE — Telephone Encounter (Signed)
Attempted to contact pt to see what day he started [place] Left message for pt to call back.

## 2022-02-11 NOTE — Telephone Encounter (Signed)
Spoke with pt and pt stated he started zeposia on 01/30/22. Pt will need labs after 02/27/22. Remind placed for labs on Monday 03/01/22.

## 2022-02-11 NOTE — Telephone Encounter (Signed)
-----   Message from Marice Potter, RN sent at 01/14/2022  8:09 AM EDT ----- Pt needs CBC and LFTs 4-6 weeks after initiating Zeposia. Need to place orders.

## 2022-02-12 ENCOUNTER — Encounter: Payer: Self-pay | Admitting: Gastroenterology

## 2022-02-12 ENCOUNTER — Other Ambulatory Visit: Payer: Self-pay

## 2022-02-12 DIAGNOSIS — K51919 Ulcerative colitis, unspecified with unspecified complications: Secondary | ICD-10-CM

## 2022-02-12 DIAGNOSIS — D500 Iron deficiency anemia secondary to blood loss (chronic): Secondary | ICD-10-CM

## 2022-02-12 DIAGNOSIS — K921 Melena: Secondary | ICD-10-CM

## 2022-02-12 NOTE — Telephone Encounter (Signed)
Confirmed with [facility] at [phone] that patient doesn't need separate PA for maintenance drug. Patient has 0 copay since his deductible met per pharmacy. They will be contacting the patient to schedule the delivery. LVM to the patient. Received PA approval from 02/04/22 to 02/05/23.

## 2022-02-16 ENCOUNTER — Telehealth: Payer: Self-pay | Admitting: Hematology and Oncology

## 2022-02-16 NOTE — Telephone Encounter (Signed)
Scheduled appt per 9/22 referral. Pt is aware of appt date and time. Pt is aware to arrive 15 mins prior to appt time and to bring and updated insurance card. Pt is aware of appt location.   

## 2022-03-02 ENCOUNTER — Telehealth: Payer: Self-pay

## 2022-03-02 ENCOUNTER — Other Ambulatory Visit: Payer: Self-pay

## 2022-03-02 DIAGNOSIS — K921 Melena: Secondary | ICD-10-CM

## 2022-03-02 DIAGNOSIS — D500 Iron deficiency anemia secondary to blood loss (chronic): Secondary | ICD-10-CM

## 2022-03-02 DIAGNOSIS — K51919 Ulcerative colitis, unspecified with unspecified complications: Secondary | ICD-10-CM

## 2022-03-02 NOTE — Telephone Encounter (Signed)
Left message for pt to call back  °

## 2022-03-02 NOTE — Telephone Encounter (Signed)
-----   Message from Marice Potter, RN sent at 02/11/2022  3:32 PM EDT ----- Regarding: labs Pt needs CBC and LFTs on 03/01/22.

## 2022-03-03 NOTE — Telephone Encounter (Signed)
Left detailed message on pt's voicemail notifying pt of lab work that is due.

## 2022-03-03 NOTE — Telephone Encounter (Signed)
Patient is returning your call.  

## 2022-03-04 ENCOUNTER — Other Ambulatory Visit (INDEPENDENT_AMBULATORY_CARE_PROVIDER_SITE_OTHER): Payer: 59

## 2022-03-04 ENCOUNTER — Telehealth: Payer: Self-pay

## 2022-03-04 ENCOUNTER — Other Ambulatory Visit: Payer: Self-pay

## 2022-03-04 DIAGNOSIS — K51919 Ulcerative colitis, unspecified with unspecified complications: Secondary | ICD-10-CM | POA: Diagnosis not present

## 2022-03-04 DIAGNOSIS — K921 Melena: Secondary | ICD-10-CM

## 2022-03-04 DIAGNOSIS — D500 Iron deficiency anemia secondary to blood loss (chronic): Secondary | ICD-10-CM | POA: Diagnosis not present

## 2022-03-04 LAB — HEPATIC FUNCTION PANEL
ALT: 12 U/L (ref 0–53)
AST: 12 U/L (ref 0–37)
Albumin: 4.4 g/dL (ref 3.5–5.2)
Alkaline Phosphatase: 55 U/L (ref 39–117)
Bilirubin, Direct: 0 mg/dL (ref 0.0–0.3)
Total Bilirubin: 0.2 mg/dL (ref 0.2–1.2)
Total Protein: 7.9 g/dL (ref 6.0–8.3)

## 2022-03-04 LAB — CBC WITH DIFFERENTIAL/PLATELET
Basophils Absolute: 0 10*3/uL (ref 0.0–0.1)
Basophils Relative: 0.7 % (ref 0.0–3.0)
Eosinophils Absolute: 0.1 10*3/uL (ref 0.0–0.7)
Eosinophils Relative: 1.7 % (ref 0.0–5.0)
HCT: 24.5 % — ABNORMAL LOW (ref 39.0–52.0)
Hemoglobin: 7.7 g/dL — CL (ref 13.0–17.0)
Lymphocytes Relative: 9 % — ABNORMAL LOW (ref 12.0–46.0)
Lymphs Abs: 0.4 10*3/uL — ABNORMAL LOW (ref 0.7–4.0)
MCHC: 31.2 g/dL (ref 30.0–36.0)
MCV: 73.4 fl — ABNORMAL LOW (ref 78.0–100.0)
Monocytes Absolute: 0.4 10*3/uL (ref 0.1–1.0)
Monocytes Relative: 10.1 % (ref 3.0–12.0)
Neutro Abs: 3.2 10*3/uL (ref 1.4–7.7)
Neutrophils Relative %: 78.5 % — ABNORMAL HIGH (ref 43.0–77.0)
Platelets: 466 10*3/uL — ABNORMAL HIGH (ref 150.0–400.0)
RBC: 3.34 Mil/uL — ABNORMAL LOW (ref 4.22–5.81)
RDW: 18.2 % — ABNORMAL HIGH (ref 11.5–15.5)
WBC: 4 10*3/uL (ref 4.0–10.5)

## 2022-03-04 NOTE — Telephone Encounter (Signed)
Hemoglobin 7.7.  Patient with iron deficiency anemia due to ulcerative colitis  He should continue oral iron.  Dr. Bryan Lemma referred him to hematology for consideration of IV iron.  He has received 4 doses of IV iron in the past. Please expedite this referral as he needs to receive IV iron ASAP Continue oral iron for now  Continue to see therapy and repeat CBC under Dr. Bryan Lemma in 1 week

## 2022-03-04 NOTE — Telephone Encounter (Addendum)
Pt is scheduled for hematology appointment on 03/09/22. Pt states that he is still having rectal bleeding with Zeposia. Pt states he feels fine and doesn't have any other symptoms other than the rectal bleeding. Bleeding is lighter in the morning and gets more heavy at night and blood is dark red. Stools are formed. Pt started Zeposia on 01/30/22. Pt states Henrietta Dine is the only medication he is taking for his UC.  Lab order and reminder placed for repeat CBC. Let pt know he needs to continue oral iron for now. Pt verbalized understanding.

## 2022-03-04 NOTE — Telephone Encounter (Signed)
Received a call from Santiago Glad in the [place] lab. Critical Hgb 7.7   Dr. Hilarie Fredrickson as DOD PM of 03/04/22 - please advise.  Dr. Bryan Lemma is out of the office for the remainder of the week. Results should be available in epic soon. Thanks Routing to Elta Guadeloupe, RN since she is Dr. Vivia Ewing nurse but could not take the call.

## 2022-03-09 ENCOUNTER — Inpatient Hospital Stay: Payer: 59 | Attending: Hematology and Oncology | Admitting: Hematology and Oncology

## 2022-03-09 VITALS — BP 136/90 | HR 114 | Temp 97.8°F | Resp 14 | Ht 63.0 in | Wt 188.8 lb

## 2022-03-09 DIAGNOSIS — R531 Weakness: Secondary | ICD-10-CM | POA: Diagnosis not present

## 2022-03-09 DIAGNOSIS — Z8042 Family history of malignant neoplasm of prostate: Secondary | ICD-10-CM | POA: Insufficient documentation

## 2022-03-09 DIAGNOSIS — Z8249 Family history of ischemic heart disease and other diseases of the circulatory system: Secondary | ICD-10-CM | POA: Insufficient documentation

## 2022-03-09 DIAGNOSIS — R42 Dizziness and giddiness: Secondary | ICD-10-CM | POA: Diagnosis not present

## 2022-03-09 DIAGNOSIS — Z8349 Family history of other endocrine, nutritional and metabolic diseases: Secondary | ICD-10-CM | POA: Diagnosis not present

## 2022-03-09 DIAGNOSIS — D500 Iron deficiency anemia secondary to blood loss (chronic): Secondary | ICD-10-CM

## 2022-03-09 DIAGNOSIS — D509 Iron deficiency anemia, unspecified: Secondary | ICD-10-CM | POA: Insufficient documentation

## 2022-03-09 DIAGNOSIS — Z8049 Family history of malignant neoplasm of other genital organs: Secondary | ICD-10-CM | POA: Insufficient documentation

## 2022-03-09 DIAGNOSIS — K5190 Ulcerative colitis, unspecified, without complications: Secondary | ICD-10-CM | POA: Insufficient documentation

## 2022-03-09 DIAGNOSIS — Z833 Family history of diabetes mellitus: Secondary | ICD-10-CM | POA: Insufficient documentation

## 2022-03-09 DIAGNOSIS — Z8261 Family history of arthritis: Secondary | ICD-10-CM | POA: Diagnosis not present

## 2022-03-09 DIAGNOSIS — K51919 Ulcerative colitis, unspecified with unspecified complications: Secondary | ICD-10-CM

## 2022-03-09 DIAGNOSIS — Z79899 Other long term (current) drug therapy: Secondary | ICD-10-CM | POA: Insufficient documentation

## 2022-03-09 NOTE — Progress Notes (Signed)
[facility] CONSULT NOTE  Patient Care Team: Ma Hillock, DO as PCP - General (Family Medicine) Lavonna Monarch, MD (Inactive) as Consulting Physician (Dermatology)  CHIEF COMPLAINTS/PURPOSE OF CONSULTATION:  Iron deficiency anemia  HISTORY OF PRESENTING ILLNESS:  Willie James 21 y.o. male is here because of recent diagnosis of severe iron deficiency anemia due to bleeding from ulcerative colitis.  He was in fairly good health up until June of this year.  In July he got admitted to the hospital abdominal pain and was diagnosed with colitis.  He underwent further evaluation with gastroenterology and was diagnosed with ulcerative colitis.  He has had intermittent mild to moderate degree of gastrointestinal bleeding.  While he was in the hospital he received blood transfusion as well as 2 doses of IV iron this was continued as outpatient he received 3 more doses of IV iron.  He had no problems tolerating the iron.  Lately he has been feeling once again fairly weak and lightheaded and his hemoglobin came back at 7.7.  He was referred to [place] for keeping up on his iron requirements.  I reviewed her records extensively and collaborated the history with the patient.     MEDICAL HISTORY:  Past Medical History:  Diagnosis Date   Frequent headaches    Left ventricular hypertrophy by electrocardiogram 10/28/2015   echo after was normal.    Palpitations 10/30/2015   pt reports palpitaitons and chest discomfort. EKG (reported LVH) and then NORMAL echo completed.     SURGICAL HISTORY: Past Surgical History:  Procedure Laterality Date   BIOPSY  12/17/2021   Procedure: BIOPSY;  Surgeon: Jerene Bears, MD;  Location: [facility];  Service: Gastroenterology;;  EGD and COLON   COLONOSCOPY N/A 12/17/2021   Procedure: COLONOSCOPY;  Surgeon: Jerene Bears, MD;  Location: [facility];  Service: Gastroenterology;  Laterality: N/A;   ESOPHAGOGASTRODUODENOSCOPY (EGD) WITH PROPOFOL N/A  12/17/2021   Procedure: ESOPHAGOGASTRODUODENOSCOPY (EGD) WITH PROPOFOL;  Surgeon: Jerene Bears, MD;  Location: [facility];  Service: Gastroenterology;  Laterality: N/A;   NO PAST SURGERIES      SOCIAL HISTORY: Social History   Socioeconomic History   Marital status: Single    Spouse name: Not on file   Number of children: Not on file   Years of education: Not on file   Highest education level: Some college, no degree  Occupational History   Not on file  Tobacco Use   Smoking status: Never   Smokeless tobacco: Never  Vaping Use   Vaping Use: Never used  Substance and Sexual Activity   Alcohol use: Never   Drug use: Never   Sexual activity: Never  Other Topics Concern   Not on file  Social History Narrative   Marital status/children/pets: Single.    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[employer])   Safety:      -Wears a bicycle helmet riding a bike: Yes     -smoke alarm in the home:Yes     - wears seatbelt: Yes     - Feels safe in their relationships: Yes   Social Determinants of Health   Financial Resource Strain: Low Risk  (11/02/2021)   Overall Financial Resource Strain (CARDIA)    Difficulty of Paying Living Expenses: Not hard at all  Food Insecurity: No Food Insecurity (11/02/2021)   Hunger Vital Sign    Worried About Running Out of Food in the Last Year: Never true    Ran Out of Food in the Last Year:  Never true  Transportation Needs: No Transportation Needs (11/02/2021)   PRAPARE - Hydrologist (Medical): No    Lack of Transportation (Non-Medical): No  Physical Activity: Unknown (11/02/2021)   Exercise Vital Sign    Days of Exercise per Week: 0 days    Minutes of Exercise per Session: Not on file  Stress: No Stress Concern Present (11/02/2021)   [place]    Feeling of Stress : Not at all  Social Connections: Socially Isolated (11/02/2021)   Social Connection and  Isolation Panel [NHANES]    Frequency of Communication with Friends and Family: Never    Frequency of Social Gatherings with Friends and Family: More than three times a week    Attends Religious Services: Never    Marine scientist or Organizations: No    Attends Music therapist: Not on file    Marital Status: Never married  Human resources officer Violence: Not on file    FAMILY HISTORY: Family History  Problem Relation Age of Onset   Hyperlipidemia Father    Arthritis Maternal Grandmother    Diabetes Maternal Grandmother    Hyperlipidemia Maternal Grandmother    Hypertension Maternal Grandmother    Hypertension Maternal Grandfather    Hyperlipidemia Maternal Grandfather    Prostate cancer Maternal Grandfather    Cervical cancer Paternal Grandmother    Hypertension Paternal Grandfather    Heart disease Paternal Grandfather    Colon cancer Neg Hx    Esophageal cancer Neg Hx     ALLERGIES:  has No Known Allergies.  MEDICATIONS:  Current Outpatient Medications  Medication Sig Dispense Refill   Ozanimod HCl Starter Pack (ZEPOSIA STARTER KIT) 0.23MG &0.46MG 0.92MG(21) CPPK Take by mouth. SAMPLE GIVEN on 01/29/22/ LOT - [id number], EXP - NOV 2023     No current facility-administered medications for this visit.    REVIEW OF SYSTEMS:   Constitutional: Denies fevers, chills or abnormal night sweats   All other systems were reviewed with the patient and are negative.  PHYSICAL EXAMINATION: ECOG PERFORMANCE STATUS: 1 - Symptomatic but completely ambulatory  Vitals:   03/09/22 1303  BP: (!) 136/90  Pulse: (!) 114  Resp: 14  Temp: 97.8 F (36.6 C)  SpO2: 100%   Filed Weights   03/09/22 1303  Weight: 188 lb 12.8 oz (85.6 kg)    GENERAL:alert, no distress and comfortable    LABORATORY DATA:  I have reviewed the data as listed Lab Results  Component Value Date   WBC 4.0 03/04/2022   HGB 7.7 Repeated and verified X2. (LL) 03/04/2022   HCT 24.5 (L)  03/04/2022   MCV 73.4 (L) 03/04/2022   PLT 466.0 (H) 03/04/2022   Lab Results  Component Value Date   NA 136 01/11/2022   K 4.0 01/11/2022   CL 101 01/11/2022   CO2 25 01/11/2022    RADIOGRAPHIC STUDIES: I have personally reviewed the radiological reports and agreed with the findings in the report.  ASSESSMENT AND PLAN:  IDA (iron deficiency anemia) Etiology: Bleeding from ulcerative colitis Diagnosis of ulcerative colitis: July 2023 (hospitalization with abdominal pain and severe anemia requiring blood transfusion, iron infusions)  Lab review: Prior to June 2023: He had excellent labs 02/10/2022: Ferritin 9.1, iron saturation 4.8%, hemoglobin 8.1, platelets 501, MCV 78.6  Patient continues to bleed intermittently and is working with gastroenterology.  Treatment plan: 1.  3 doses of IV iron for the next 3 weeks  2. recheck labs 2 weeks after that Telephone visit after the labs to discuss results and to see if he needs more IV iron.  Patient understands that until the bleeding stops he will require IV iron periodically. 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  His mother accompanied him today.   All questions were answered. The patient knows to call the clinic with any problems, questions or concerns.    Harriette Ohara, MD 03/09/22

## 2022-03-09 NOTE — Addendum Note (Signed)
Addended by: Nicholas Lose on: 03/09/2022 01:35 PM   Modules accepted: Orders

## 2022-03-09 NOTE — Assessment & Plan Note (Signed)
Etiology: Bleeding from ulcerative colitis Diagnosis of ulcerative colitis: July 2023 (hospitalization with abdominal pain and severe anemia requiring blood transfusion, iron infusions)  Lab review: Prior to June 2023: He had excellent labs 02/10/2022: Ferritin 9.1, iron saturation 4.8%, hemoglobin 8.1, platelets 501, MCV 78.6  Patient continues to bleed intermittently and is working with gastroenterology.  Treatment plan: 1.  3 doses of IV iron for the next 3 weeks 2. recheck labs 2 weeks after that Telephone visit after the labs to discuss results and to see if he needs more IV iron.  Patient understands that until the bleeding stops he will require IV iron periodically. 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  His mother accompanied him today.

## 2022-03-11 ENCOUNTER — Telehealth: Payer: Self-pay | Admitting: Hematology and Oncology

## 2022-03-11 ENCOUNTER — Other Ambulatory Visit (INDEPENDENT_AMBULATORY_CARE_PROVIDER_SITE_OTHER): Payer: 59

## 2022-03-11 ENCOUNTER — Telehealth: Payer: Self-pay

## 2022-03-11 DIAGNOSIS — K51919 Ulcerative colitis, unspecified with unspecified complications: Secondary | ICD-10-CM

## 2022-03-11 DIAGNOSIS — K921 Melena: Secondary | ICD-10-CM

## 2022-03-11 LAB — CBC WITH DIFFERENTIAL/PLATELET
Basophils Absolute: 0 10*3/uL (ref 0.0–0.1)
Basophils Relative: 0.7 % (ref 0.0–3.0)
Eosinophils Absolute: 0.1 10*3/uL (ref 0.0–0.7)
Eosinophils Relative: 2.4 % (ref 0.0–5.0)
HCT: 24.7 % — ABNORMAL LOW (ref 39.0–52.0)
Hemoglobin: 7.4 g/dL — CL (ref 13.0–17.0)
Lymphocytes Relative: 8.6 % — ABNORMAL LOW (ref 12.0–46.0)
Lymphs Abs: 0.3 10*3/uL — ABNORMAL LOW (ref 0.7–4.0)
MCHC: 30 g/dL (ref 30.0–36.0)
MCV: 73 fl — ABNORMAL LOW (ref 78.0–100.0)
Monocytes Absolute: 0.3 10*3/uL (ref 0.1–1.0)
Monocytes Relative: 7.2 % (ref 3.0–12.0)
Neutro Abs: 3.3 10*3/uL (ref 1.4–7.7)
Neutrophils Relative %: 81.1 % — ABNORMAL HIGH (ref 43.0–77.0)
Platelets: 426 10*3/uL — ABNORMAL HIGH (ref 150.0–400.0)
RBC: 3.39 Mil/uL — ABNORMAL LOW (ref 4.22–5.81)
RDW: 18.4 % — ABNORMAL HIGH (ref 11.5–15.5)
WBC: 4.1 10*3/uL (ref 4.0–10.5)

## 2022-03-11 NOTE — Telephone Encounter (Signed)
-----   Message from Marice Potter, RN sent at 03/04/2022  2:13 PM EDT ----- Pt needs CBC. Order in epic.

## 2022-03-11 NOTE — Telephone Encounter (Signed)
Called pt and let him know about CBC that is due. Pt verbalized understanding.

## 2022-03-11 NOTE — Telephone Encounter (Signed)
Scheduled appointment per 10/17 los. Patient is aware. 

## 2022-03-17 ENCOUNTER — Ambulatory Visit: Payer: 59 | Admitting: Gastroenterology

## 2022-03-20 ENCOUNTER — Inpatient Hospital Stay: Payer: 59

## 2022-03-20 VITALS — BP 112/77 | HR 90 | Temp 97.7°F | Resp 17 | Ht 63.0 in | Wt 188.0 lb

## 2022-03-20 DIAGNOSIS — D500 Iron deficiency anemia secondary to blood loss (chronic): Secondary | ICD-10-CM

## 2022-03-20 DIAGNOSIS — D509 Iron deficiency anemia, unspecified: Secondary | ICD-10-CM | POA: Diagnosis not present

## 2022-03-20 MED ORDER — SODIUM CHLORIDE 0.9 % IV SOLN
300.0000 mg | Freq: Once | INTRAVENOUS | Status: AC
  Administered 2022-03-20: 300 mg via INTRAVENOUS
  Filled 2022-03-20: qty 300

## 2022-03-20 MED ORDER — LORATADINE 10 MG PO TABS
10.0000 mg | ORAL_TABLET | Freq: Every day | ORAL | Status: DC
  Administered 2022-03-20: 10 mg via ORAL
  Filled 2022-03-20: qty 1

## 2022-03-20 MED ORDER — SODIUM CHLORIDE 0.9 % IV SOLN: Freq: Once | INTRAVENOUS | Status: AC

## 2022-03-20 NOTE — Patient Instructions (Signed)

## 2022-03-27 ENCOUNTER — Inpatient Hospital Stay: Payer: 59 | Attending: Hematology and Oncology

## 2022-03-27 VITALS — BP 127/84 | HR 90 | Temp 98.1°F | Resp 18

## 2022-03-27 DIAGNOSIS — D500 Iron deficiency anemia secondary to blood loss (chronic): Secondary | ICD-10-CM | POA: Insufficient documentation

## 2022-03-27 DIAGNOSIS — K5190 Ulcerative colitis, unspecified, without complications: Secondary | ICD-10-CM | POA: Diagnosis not present

## 2022-03-27 DIAGNOSIS — Z79899 Other long term (current) drug therapy: Secondary | ICD-10-CM | POA: Insufficient documentation

## 2022-03-27 MED ORDER — SODIUM CHLORIDE 0.9 % IV SOLN
300.0000 mg | Freq: Once | INTRAVENOUS | Status: AC
  Administered 2022-03-27: 300 mg via INTRAVENOUS
  Filled 2022-03-27: qty 300

## 2022-03-27 MED ORDER — SODIUM CHLORIDE 0.9 % IV SOLN: Freq: Once | INTRAVENOUS | Status: AC

## 2022-03-27 MED ORDER — LORATADINE 10 MG PO TABS
10.0000 mg | ORAL_TABLET | Freq: Once | ORAL | Status: AC
  Administered 2022-03-27: 10 mg via ORAL
  Filled 2022-03-27: qty 1

## 2022-03-27 NOTE — Patient Instructions (Signed)

## 2022-03-27 NOTE — Progress Notes (Signed)
Pt declined to stay for post 30 obs, VSS, ambulatory to lobby upon discharge 

## 2022-03-30 ENCOUNTER — Ambulatory Visit (INDEPENDENT_AMBULATORY_CARE_PROVIDER_SITE_OTHER): Payer: 59 | Admitting: Gastroenterology

## 2022-03-30 ENCOUNTER — Encounter: Payer: Self-pay | Admitting: Gastroenterology

## 2022-03-30 ENCOUNTER — Other Ambulatory Visit (INDEPENDENT_AMBULATORY_CARE_PROVIDER_SITE_OTHER): Payer: 59

## 2022-03-30 VITALS — BP 118/80 | HR 98 | Ht 63.0 in | Wt 191.4 lb

## 2022-03-30 DIAGNOSIS — D500 Iron deficiency anemia secondary to blood loss (chronic): Secondary | ICD-10-CM

## 2022-03-30 DIAGNOSIS — K921 Melena: Secondary | ICD-10-CM

## 2022-03-30 DIAGNOSIS — K51919 Ulcerative colitis, unspecified with unspecified complications: Secondary | ICD-10-CM

## 2022-03-30 LAB — SEDIMENTATION RATE: Sed Rate: 44 mm/hr — ABNORMAL HIGH (ref 0–15)

## 2022-03-30 LAB — C-REACTIVE PROTEIN: CRP: <1 mg/dL (ref 0.5–20.0)

## 2022-03-30 NOTE — Patient Instructions (Signed)
Your provider has requested that you go to the basement level for lab work before leaving today. Press "B" on the elevator. The lab is located at the first door on the left as you exit the elevator.  Due to recent changes in healthcare laws, you may see the results of your imaging and laboratory studies on MyChart before your provider has had a chance to review them.  We understand that in some cases there may be results that are confusing or concerning to you. Not all laboratory results come back in the same time frame and the provider may be waiting for multiple results in order to interpret others.  Please give [place] 48 hours in order for your provider to thoroughly review all the results before contacting the office for clarification of your results.     Thank you for choosing me and [facility].  Vito Cirigliano, D.O.

## 2022-03-30 NOTE — Progress Notes (Signed)
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   - Started having generalized abdominal pain and hematochezia in spring 2023 - 11/02/2021: Normal CBC, TSH, iron panel, FOBT positive, CRP 13.2 - 11/13/2021: ER evaluation. WBC 12.9, H/H 13/37.6, PLT 463, Iron 27, TIBC 253, sat 11%, normal CMP, CRP 3.9 - 11/13/2021: CT A/P: Diffuse colonic wall thickening.  Normal stomach, small bowel.  No lymphadenopathy - 12/09/2021: Initial appointment GI clinic.  WBC 13.5, H/H 9.8/29.4, ferritin 25, iron 11, TIBC 304, sat 3.6%.  Folate 8.2, normal B12.  Negative/normal GI PCR panel.  Calprotectin 1500 - 7/25-29, 2023: Hospital admission with hematochezia, abdominal pain, symptomatic anemia, fever.  Treated with IV Solu-Medrol with improvement, IV iron, 1 unit PRBC transfusion.  QuantiFERON gold indeterminate. HBsAg- - 12/15/2021: CT A/P: Pancolonic inflammation slightly worse compared with prior study - 12/17/2021: EGD: LA Grade A esophagitis, mild gastritis, moderate duodenitis - 12/17/2021: Colonoscopy: Moderate pancolitis.  Normal TI - 01/11/2022: Follow-up in GI clinic.  Was doing better on prednisone and Lialda.  Completed IV iron x3.  VZV Ab+ c/w appropriate immunity.  Started on Zeposia 01/2022  HPI:     Patient is a 21 y.o. male presenting to the Gastroenterology Clinic for follow-up.  Was last seen by me on 01/11/2022.  Started on Zeposia and has since titrated off prednisone and stopped Lialda.  Has had downtrending H/H, starting at 9.1/28.1 (with iron deficiency).  Was started on oral iron and referred to Hematology.  Was seen by Dr. Lindi Adie on 10/17 for continued IDA with most recent H/H 7.4/24.7 on 03/11/2022 with plan for 3 more doses of IV iron (has completed 2 so far) and lab check 2 weeks afterwards.  Has not yet had labs rechecked.  Today, he states no pain or discomfort.  Overall better  since starting Zeposia.  Did have brief episode of return of soft/loose stool, but not diarrhea.  Attributed this to food (breakfast hot pockets), and resolved once he stopped eating those.  Otherwise, now with formed stool.  3-4 BM/day (at his baseline), but still with BRB mixed in with every BM.  No nocturnal stools.   Liver enzymes otherwise normal and normal WBC after starting Zeposia.  Review of systems:     No chest pain, no SOB, no fevers, no urinary sx   Past Medical History:  Diagnosis Date   Frequent headaches    Left ventricular hypertrophy by electrocardiogram 10/28/2015   echo after was normal.    Palpitations 10/30/2015   pt reports palpitaitons and chest discomfort. EKG (reported LVH) and then NORMAL echo completed.     Patient's surgical history, family medical history, social history, medications and allergies were all reviewed in Epic    Current Outpatient Medications  Medication Sig Dispense Refill   ZEPOSIA 0.92 MG CAPS Take 1 capsule by mouth daily.     No current facility-administered medications for this visit.    Physical Exam:     BP 118/80   Pulse 98   Ht _0  (1.6 m)   Wt 191 lb 6 oz (86.8 kg)   BMI 33.90 kg/m   GENERAL:  Pleasant male in NAD PSYCH: : Cooperative, normal affect EENT:  conjunctiva pink, mucous membranes moist, neck supple without masses CARDIAC:  RRR, no murmur heard, no peripheral edema PULM: Normal respiratory effort, lungs CTA bilaterally, no wheezing ABDOMEN:  Nondistended, soft, nontender. 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  Partial response to Lialda, and was started on Zeposia in 01/2022.  Since then has  had good clinical response, back to baseline 3-4 formed stool/day.  However, still with small amounts of BRB mixed in with stool.  Abdominal pain is otherwise resolved.    - Check fecal calprotectin, ESR, CRP today - If inflammatory markers downtrending or normal, continue to monitor - If inflammatory markers are still elevated, discussed potentially starting on Uceris or restarting Lialda - Tentative plan for repeat colonoscopy in 6-12 months to assess response to therapy - LFTs and WBC normal - Avoid live vaccines during treatment course   3) Iron deficiency anemia 2/2 UC.  While there are some possibilities of the previous induced anemia (4.2% in trials), the anemia existed prior to initiating Zeposia.  I do not think we need to discontinue this drug at this juncture. - Receiving IV iron this weekend and plan for follow-up in the Hematology Clinic and repeat lab work as scheduled - If continued iron deficiency anemia, and depending on labs as above, may need to proceed with repeat colonoscopy sooner than originally planned     RTC in 2-3 months or sooner as needed      I spent 30 minutes of time, including in depth chart review, independent review of results as outlined above, communicating results with the patient directly, face-to-face time with the patient, coordinating care, and ordering studies and medications as appropriate, and documentation.   Lavena Bullion ,DO, FACG 03/30/2022, 1:37 PM

## 2022-04-02 ENCOUNTER — Other Ambulatory Visit: Payer: 59

## 2022-04-02 DIAGNOSIS — K51919 Ulcerative colitis, unspecified with unspecified complications: Secondary | ICD-10-CM

## 2022-04-02 DIAGNOSIS — D500 Iron deficiency anemia secondary to blood loss (chronic): Secondary | ICD-10-CM

## 2022-04-02 MED FILL — Iron Sucrose Inj 20 MG/ML (Fe Equiv): INTRAVENOUS | Qty: 15 | Status: AC

## 2022-04-03 ENCOUNTER — Inpatient Hospital Stay: Payer: 59

## 2022-04-03 VITALS — BP 119/81 | HR 90 | Temp 98.1°F | Resp 18 | Ht 63.0 in

## 2022-04-03 DIAGNOSIS — D500 Iron deficiency anemia secondary to blood loss (chronic): Secondary | ICD-10-CM

## 2022-04-03 MED ORDER — SODIUM CHLORIDE 0.9% FLUSH: 10.0000 mL | Freq: Once | INTRAVENOUS | Status: DC | PRN

## 2022-04-03 MED ORDER — SODIUM CHLORIDE 0.9 % IV SOLN
300.0000 mg | Freq: Once | INTRAVENOUS | Status: AC
  Administered 2022-04-03: 300 mg via INTRAVENOUS
  Filled 2022-04-03: qty 300

## 2022-04-03 MED ORDER — LORATADINE 10 MG PO TABS
10.0000 mg | ORAL_TABLET | Freq: Once | ORAL | Status: AC
  Administered 2022-04-03: 10 mg via ORAL
  Filled 2022-04-03: qty 1

## 2022-04-03 MED ORDER — SODIUM CHLORIDE 0.9 % IV SOLN: Freq: Once | INTRAVENOUS | Status: AC

## 2022-04-03 NOTE — Patient Instructions (Signed)

## 2022-04-03 NOTE — Progress Notes (Signed)
Pt declined to stay post 30obs, VSS, ambulatory to lobby upon discharge

## 2022-04-07 ENCOUNTER — Other Ambulatory Visit: Payer: Self-pay

## 2022-04-07 DIAGNOSIS — K51919 Ulcerative colitis, unspecified with unspecified complications: Secondary | ICD-10-CM

## 2022-04-07 LAB — CALPROTECTIN, FECAL: Calprotectin, Fecal: 2290 ug/g — ABNORMAL HIGH (ref 0–120)

## 2022-04-07 MED ORDER — BUDESONIDE ER 9 MG PO TB24: ORAL_TABLET | ORAL | 0 refills | Status: DC

## 2022-04-19 ENCOUNTER — Other Ambulatory Visit: Payer: Self-pay | Admitting: *Deleted

## 2022-04-19 DIAGNOSIS — D500 Iron deficiency anemia secondary to blood loss (chronic): Secondary | ICD-10-CM

## 2022-04-20 ENCOUNTER — Inpatient Hospital Stay: Payer: 59

## 2022-04-20 DIAGNOSIS — D500 Iron deficiency anemia secondary to blood loss (chronic): Secondary | ICD-10-CM

## 2022-04-20 LAB — IRON AND IRON BINDING CAPACITY (CC-WL,HP ONLY)
Iron: 35 ug/dL — ABNORMAL LOW (ref 45–182)
Saturation Ratios: 7 % — ABNORMAL LOW (ref 17.9–39.5)
TIBC: 472 ug/dL — ABNORMAL HIGH (ref 250–450)
UIBC: 437 ug/dL — ABNORMAL HIGH (ref 117–376)

## 2022-04-20 LAB — FERRITIN: Ferritin: 46 ng/mL (ref 24–336)

## 2022-04-20 LAB — CBC WITH DIFFERENTIAL (CANCER CENTER ONLY)
Abs Immature Granulocytes: 0.02 10*3/uL (ref 0.00–0.07)
Basophils Absolute: 0 10*3/uL (ref 0.0–0.1)
Basophils Relative: 1 %
Eosinophils Absolute: 0 10*3/uL (ref 0.0–0.5)
Eosinophils Relative: 0 %
HCT: 40.6 % (ref 39.0–52.0)
Hemoglobin: 12.2 g/dL — ABNORMAL LOW (ref 13.0–17.0)
Immature Granulocytes: 0 %
Lymphocytes Relative: 9 %
Lymphs Abs: 0.5 10*3/uL — ABNORMAL LOW (ref 0.7–4.0)
MCH: 24.5 pg — ABNORMAL LOW (ref 26.0–34.0)
MCHC: 30 g/dL (ref 30.0–36.0)
MCV: 81.7 fL (ref 80.0–100.0)
Monocytes Absolute: 0.8 10*3/uL (ref 0.1–1.0)
Monocytes Relative: 15 %
Neutro Abs: 4.1 10*3/uL (ref 1.7–7.7)
Neutrophils Relative %: 75 %
Platelet Count: 367 10*3/uL (ref 150–400)
RBC: 4.97 MIL/uL (ref 4.22–5.81)
RDW: 21.7 % — ABNORMAL HIGH (ref 11.5–15.5)
WBC Count: 5.5 10*3/uL (ref 4.0–10.5)
nRBC: 0 % (ref 0.0–0.2)

## 2022-04-20 NOTE — Progress Notes (Signed)
HEMATOLOGY-ONCOLOGY TELEPHONE VISIT PROGRESS NOTE  I connected with our patient on 04/22/22 at  2:15 PM EST by telephone and verified that I am speaking with the correct person using two identifiers.  I discussed the limitations, risks, security and privacy concerns of performing an evaluation and management service by telephone and the availability of in person appointments.  I also discussed with the patient that there may be a patient responsible charge related to this service. The patient expressed understanding and agreed to proceed.   History of Present Illness: Willie James 21 y.o. male is here because of recent diagnosis of severe iron deficiency anemia due to bleeding from ulcerative colitis. She presents to the clinic for a telephone visit after the labs to discuss results and to see if he needs more IV iron.  He is feeling significantly better.  His bleeding is also decreased because of steroid usage.  REVIEW OF SYSTEMS:   Constitutional: Denies fevers, chills or abnormal weight loss All other systems were reviewed with the patient and are negative. Observations/Objective:     Assessment Plan:  IDA (iron deficiency anemia) Etiology: Bleeding from ulcerative colitis Diagnosis of ulcerative colitis: July 2023 (hospitalization with abdominal pain and severe anemia requiring blood transfusion, iron infusions)   Lab review: Prior to June 2023: He had excellent labs 02/10/2022: Ferritin 9.1, iron saturation 4.8%, hemoglobin 8.1, platelets 501, MCV 78.6 04/20/2022: Hemoglobin 12.2, MCV 81.7, platelets 367, iron saturation 7%, TIBC 472, ferritin 46   IV iron: August 2023, November 2023  Has been on steroids and this has helped his inflammation.  He reports that his bleeding is somewhat improved.  I discussed the results of the bowel labs and determined that he may still be slightly iron deficient but we can watch and monitor him.  Recheck labs in 3 months and telephone visit 2 days  later to discuss results.    I discussed the assessment and treatment plan with the patient. The patient was provided an opportunity to ask questions and all were answered. The patient agreed with the plan and demonstrated an understanding of the instructions. The patient was advised to call back or seek an in-person evaluation if the symptoms worsen or if the condition fails to improve as anticipated.   I provided 12 minutes of non-face-to-face time during this encounter.  This includes time for charting and coordination of care   Tamsen Meek, MD  I Janan Ridge am scribing for Dr. Pamelia Hoit  I have reviewed the above documentation for accuracy and completeness, and I agree with the above.

## 2022-04-22 ENCOUNTER — Inpatient Hospital Stay (HOSPITAL_BASED_OUTPATIENT_CLINIC_OR_DEPARTMENT_OTHER): Payer: 59 | Admitting: Hematology and Oncology

## 2022-04-22 DIAGNOSIS — D500 Iron deficiency anemia secondary to blood loss (chronic): Secondary | ICD-10-CM

## 2022-04-22 NOTE — Assessment & Plan Note (Signed)
Etiology: Bleeding from ulcerative colitis Diagnosis of ulcerative colitis: July 2023 (hospitalization with abdominal pain and severe anemia requiring blood transfusion, iron infusions)   Lab review: Prior to June 2023: He had excellent labs 02/10/2022: Ferritin 9.1, iron saturation 4.8%, hemoglobin 8.1, platelets 501, MCV 78.6 04/20/2022: Hemoglobin 12.2, MCV 81.7, platelets 367, iron saturation 7%, TIBC 472, ferritin 46   IV iron: August 2023, November 2023  Has been on steroids and this has helped his inflammation.  He reports that his bleeding is somewhat improved.  I discussed the results of the bowel labs and determined that he may still be slightly iron deficient but we can watch and monitor him.  Recheck labs in 3 months and telephone visit 2 days later to discuss results.

## 2022-04-23 ENCOUNTER — Telehealth: Payer: Self-pay | Admitting: Hematology and Oncology

## 2022-04-23 NOTE — Telephone Encounter (Signed)
Scheduled appointment per 11/30 los. Patient is aware. 

## 2022-06-08 ENCOUNTER — Encounter: Payer: Self-pay | Admitting: Gastroenterology

## 2022-06-08 ENCOUNTER — Other Ambulatory Visit: Payer: 59

## 2022-06-08 ENCOUNTER — Telehealth: Payer: Self-pay

## 2022-06-08 ENCOUNTER — Other Ambulatory Visit: Payer: Self-pay

## 2022-06-08 ENCOUNTER — Ambulatory Visit: Payer: 59 | Admitting: Gastroenterology

## 2022-06-08 VITALS — BP 140/102 | HR 92 | Ht 63.0 in | Wt 201.5 lb

## 2022-06-08 DIAGNOSIS — K921 Melena: Secondary | ICD-10-CM | POA: Diagnosis not present

## 2022-06-08 DIAGNOSIS — D500 Iron deficiency anemia secondary to blood loss (chronic): Secondary | ICD-10-CM

## 2022-06-08 DIAGNOSIS — K51919 Ulcerative colitis, unspecified with unspecified complications: Secondary | ICD-10-CM

## 2022-06-08 MED ORDER — PREDNISONE 10 MG PO TABS: ORAL_TABLET | ORAL | 0 refills | Status: AC

## 2022-06-08 NOTE — Patient Instructions (Addendum)
Your provider has requested that you go to the basement level for lab work before leaving today. Press "B" on the elevator. The lab is located at the first door on the left as you exit the elevator.   Stop Zeposia, and Uceris.  We will call you for Entyvio.  We have sent the following medications to your pharmacy for you to pick up at your convenience: Prednisone.   If you are age 24 or older, your body mass index should be between 23-30. Your Body mass index is 35.69 kg/m. If this is out of the aforementioned range listed, please consider follow up with your Primary Care Provider.  If you are age 25 or younger, your body mass index should be between 19-25. Your Body mass index is 35.69 kg/m. If this is out of the aformentioned range listed, please consider follow up with your Primary Care Provider.   __________________________________________________________  The [facility] providers would like to encourage you to use [facility] to communicate with providers for non-urgent requests or questions.  Due to long hold times on the telephone, sending your provider a message by [facility] may be a faster and more efficient way to get a response.  Please allow 48 business hours for a response.  Please remember that this is for non-urgent requests.   Due to recent changes in healthcare laws, you may see the results of your imaging and laboratory studies on MyChart before your provider has had a chance to review them.  We understand that in some cases there may be results that are confusing or concerning to you. Not all laboratory results come back in the same time frame and the provider may be waiting for multiple results in order to interpret others.  Please give [place] 48 hours in order for your provider to thoroughly review all the results before contacting the office for clarification of your results.    Thank you for choosing me and [facility].  Vito Cirigliano, D.O.

## 2022-06-08 NOTE — Telephone Encounter (Signed)
Entyvio orders placed for [facility] infusion center at [facility].

## 2022-06-08 NOTE — Progress Notes (Signed)
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   - Started having generalized abdominal pain and hematochezia in spring 2023 - 11/02/2021: Normal CBC, TSH, iron panel, FOBT positive, CRP 13.2 - 11/13/2021: ER evaluation. WBC 12.9, H/H 13/37.6, PLT 463, Iron 27, TIBC 253, sat 11%, normal CMP, CRP 3.9 - 11/13/2021: CT A/P: Diffuse colonic wall thickening.  Normal stomach, small bowel.  No lymphadenopathy - 12/09/2021: Initial appointment GI clinic.  WBC 13.5, H/H 9.8/29.4, ferritin 25, iron 11, TIBC 304, sat 3.6%.  Folate 8.2, normal B12.  Negative/normal GI PCR panel.  Calprotectin 1500 - 7/25-29, 2023: Hospital admission with hematochezia, abdominal pain, symptomatic anemia, fever.  Treated with IV Solu-Medrol with improvement, IV iron, 1 unit PRBC transfusion.  QuantiFERON gold indeterminate. HBsAg- - 12/15/2021: CT A/P: Pancolonic inflammation slightly worse compared with prior study - 12/17/2021: EGD: LA Grade A esophagitis, mild gastritis, moderate duodenitis - 12/17/2021: Colonoscopy: Moderate pancolitis.  Normal TI - 01/11/2022: Follow-up in GI clinic.  Was doing better on prednisone and Lialda.  Completed IV iron x3.  VZV Ab+ c/w appropriate immunity.  Started on Zeposia 01/2022  HPI:     Patient is a 22 y.o. male presenting to the Gastroenterology Clinic for follow-up.  He was last seen by me on 03/30/2022.  Was feeling better since starting Zeposia with bowel habits back to baseline (3-4 BM/day), but still with BRB mixed in stools. - Fecal calprotectin 2290, ESR 44, CRP<1 - Treated with Uceris 9 mg daily x 8 weeks with taper with continued Zeposia with plan for repeat calprotectin in 2-3 months after completion of Uceris  He states not much improvement with the Uceris, and in fact now with more bleeding with BM. Currently on Uceris taper. Now with 2-3 formed BM/day, but with BRB with every BM.   Has completed IV iron with  Dr. Lindi Adie and follow-up in hematology clinic on 04/22/2022.  He has a follow-up appointment in March. - 04/20/2022: H/H 12.2/40.6 (up from 7.4/24.6), ferritin 46, iron 35, TIBC 472, sat 7%    Review of systems:     No chest pain, no SOB, no fevers, no urinary sx   Past Medical History:  Diagnosis Date   Frequent headaches    Left ventricular hypertrophy by electrocardiogram 10/28/2015   echo after was normal.    Palpitations 10/30/2015   pt reports palpitaitons and chest discomfort. EKG (reported LVH) and then NORMAL echo completed.     Patient's surgical history, family medical history, social history, medications and allergies were all reviewed in Epic    Current Outpatient Medications  Medication Sig Dispense Refill   Budesonide ER (UCERIS) 9 MG TB24 Take 9 mg by mouth daily for 56 days, THEN 9 mg every other day for 14 days, THEN 9 mg every 3 (three) days for 14 days. 67 tablet 0   ZEPOSIA 0.92 MG CAPS Take 1 capsule by mouth daily.     No current facility-administered medications for this visit.    Physical Exam:     BP (!) 140/102   Pulse 92   Ht 5\' 3"  (1.6 m)   Wt 201 lb 8 oz (91.4 kg)   BMI 35.69 kg/m   GENERAL:  Pleasant male in NAD PSYCH: : Cooperative, normal affect Musculoskeletal:  Normal muscle tone, normal strength NEURO: Alert and oriented x 3, no focal neurologic deficits   IMPRESSION and PLAN:    1) Ulcerative Colitis 2) Hematochezia 22 year old  male with recently diagnosed steroid responsive pan-Ulcerative Pancolitis.  Partial response to Lialda, and was started on Zeposia in 01/2022 with poor response and uptrending fecal calprotectin, all consistent with continued active UC.  Was started on Uceris, but continued symptoms.  - Check fecal calprotectin, ESR today  - Start Prednisone 40 mg/day x2 weeks with slow taper of 5 mg weekly - Stop Zeposia - Stop Uceris - Recheck Quantiferon gold (previously indeterminate) - Start Entyvio. - We had a  long conversation today regarding the risk/benefit profile of immunosuppressive therapy, to include Humira, Remicade, Entyvio, along with immunomodulators, with plan to start Entyvio as above - Tentative plan for repeat colonoscopy in 6-12 months to assess response to therapy - Avoid live vaccines during treatment course   3) Iron deficiency anemia 2/2 UC.  Good response to IV iron - Follow-up with Dr. Aris Lot in March as scheduled  RTC in 2-3 months or sooner as needed    I spent 35 minutes of time, including in depth chart review, independent review of results as outlined above, communicating results with the patient directly, face-to-face time with the patient, coordinating care, and ordering studies and medications as appropriate, and documentation.            Lavena Bullion ,DO, FACG 06/08/2022, 9:13 AM

## 2022-06-10 ENCOUNTER — Other Ambulatory Visit: Payer: 59

## 2022-06-10 DIAGNOSIS — K51919 Ulcerative colitis, unspecified with unspecified complications: Secondary | ICD-10-CM

## 2022-06-10 DIAGNOSIS — D500 Iron deficiency anemia secondary to blood loss (chronic): Secondary | ICD-10-CM

## 2022-06-10 LAB — QUANTIFERON-TB GOLD PLUS
Mitogen-NIL: 2.04 [IU]/mL
NIL: 0.01 [IU]/mL
QuantiFERON-TB Gold Plus: NEGATIVE
TB1-NIL: 0 [IU]/mL
TB2-NIL: 0 [IU]/mL

## 2022-06-15 LAB — CALPROTECTIN, FECAL: Calprotectin, Fecal: 295 ug/g — ABNORMAL HIGH (ref 0–120)

## 2022-06-16 ENCOUNTER — Telehealth: Payer: Self-pay | Admitting: Pharmacy Technician

## 2022-06-16 NOTE — Telephone Encounter (Addendum)
Dr. Bryan Lemma, [facility] note:  Patient will be scheduled as soon as possible.  Auth Submission: approved Payer: [employer] Medication & CPT/J Code(s) submitted: Entyvio (Vedolizumab) [id number] Route of submission (phone, fax, portal):  Phone [phone] Fax [phone] Auth type: Buy/Bill Units/visits requested: 7 Reference number: [id number] Approval from: 06/16/22  to  06/16/23  [facility] co-pay card: approved ID: [id number] GR: [id number] [place]: [id number] PCN: [id number]

## 2022-06-17 ENCOUNTER — Encounter: Payer: Self-pay | Admitting: Hematology and Oncology

## 2022-06-17 ENCOUNTER — Encounter: Payer: Self-pay | Admitting: Gastroenterology

## 2022-07-01 ENCOUNTER — Ambulatory Visit (INDEPENDENT_AMBULATORY_CARE_PROVIDER_SITE_OTHER): Payer: 59

## 2022-07-01 VITALS — BP 126/80 | HR 101 | Temp 98.3°F | Resp 16 | Ht 63.0 in | Wt 205.2 lb

## 2022-07-01 DIAGNOSIS — K51919 Ulcerative colitis, unspecified with unspecified complications: Secondary | ICD-10-CM | POA: Diagnosis not present

## 2022-07-01 DIAGNOSIS — K921 Melena: Secondary | ICD-10-CM

## 2022-07-01 MED ORDER — VEDOLIZUMAB 300 MG IV SOLR
300.0000 mg | Freq: Once | INTRAVENOUS | Status: AC
  Administered 2022-07-01: 300 mg via INTRAVENOUS
  Filled 2022-07-01: qty 5

## 2022-07-01 NOTE — Progress Notes (Addendum)
Diagnosis:   Ulcerative colitis     Provider:  Marshell Garfinkel MD  Procedure: Infusion  IV Type: Peripheral, IV Location: L Antecubital  Entyvio (Vedolizumab), Dose: 300 mg  Infusion Start Time: [id number]  Infusion Stop Time: 1046  Post Infusion IV Care: Observation period completed and Peripheral IV Discontinued  Discharge: Condition: Good, Destination: Home . AVS Provided  Performed by:  Adelina Mings, LPN

## 2022-07-05 ENCOUNTER — Other Ambulatory Visit: Payer: Self-pay | Admitting: Gastroenterology

## 2022-07-15 ENCOUNTER — Ambulatory Visit (INDEPENDENT_AMBULATORY_CARE_PROVIDER_SITE_OTHER): Payer: 59

## 2022-07-15 VITALS — BP 125/81 | HR 108 | Temp 98.6°F | Resp 16 | Ht 63.0 in | Wt 207.2 lb

## 2022-07-15 DIAGNOSIS — K921 Melena: Secondary | ICD-10-CM | POA: Diagnosis not present

## 2022-07-15 DIAGNOSIS — K51919 Ulcerative colitis, unspecified with unspecified complications: Secondary | ICD-10-CM

## 2022-07-15 MED ORDER — VEDOLIZUMAB 300 MG IV SOLR
300.0000 mg | Freq: Once | INTRAVENOUS | Status: AC
  Administered 2022-07-15: 300 mg via INTRAVENOUS
  Filled 2022-07-15: qty 5

## 2022-07-15 NOTE — Progress Notes (Addendum)
Diagnosis:   Ulcerative    Colitis   Provider:  Marshell Garfinkel MD  Procedure: Infusion  IV Type: Peripheral, IV Location: R Antecubital  Entyvio (Vedolizumab), Dose: 300 mg  Infusion Start Time: 1300  Infusion Stop Time: [id number]  Post Infusion IV Care: Peripheral IV Discontinued  Discharge: Condition: Good, Destination: Home . AVS Declined  Performed by:  Adelina Mings, LPN

## 2022-07-17 ENCOUNTER — Encounter: Payer: Self-pay | Admitting: Gastroenterology

## 2022-07-22 NOTE — Progress Notes (Signed)
HEMATOLOGY-ONCOLOGY TELEPHONE VISIT PROGRESS NOTE  I connected with our patient on 07/26/22 at  1:45 PM EST by telephone and verified that I am speaking with the correct person using two identifiers.  I discussed the limitations, risks, security and privacy concerns of performing an evaluation and management service by telephone and the availability of in person appointments.  I also discussed with the patient that there may be a patient responsible charge related to this service. The patient expressed understanding and agreed to proceed.   History of Present Illness:  Willie James 22 y.o. male is here because of recent diagnosis of severe iron deficiency anemia due to bleeding from ulcerative colitis. She presents to the clinic for a telephone visit after the labs to discuss results.  Oncology History   No history exists.    REVIEW OF SYSTEMS:   Constitutional: Denies fevers, chills or abnormal weight loss All other systems were reviewed with the patient and are negative. Observations/Objective:     Assessment Plan:  IDA (iron deficiency anemia) Etiology: Bleeding from ulcerative colitis Diagnosis of ulcerative colitis: July 2023 (hospitalization with abdominal pain and severe anemia requiring blood transfusion, iron infusions) Now on Entyvio (improving)   Lab review: Prior to June 2023: He had excellent labs 02/10/2022: Ferritin 9.1, iron saturation 4.8%, hemoglobin 8.1, platelets 501, MCV 78.6 04/20/2022: Hemoglobin 12.2, MCV 81.7, platelets 367, iron saturation 7%, TIBC 472, ferritin 46 07/23/2022: Hemoglobin 11.3, MCV 80.8, platelets 436, iron saturation 4%, TIBC 472, ferritin 5    IV iron: August 2023, November 2023  I recommended 3 more doses of IV iron Recheck labs in 3 months and telephone visit after that to discuss results    I discussed the assessment and treatment plan with the patient. The patient was provided an opportunity to ask questions and all were answered.  The patient agreed with the plan and demonstrated an understanding of the instructions. The patient was advised to call back or seek an in-person evaluation if the symptoms worsen or if the condition fails to improve as anticipated.   I provided 12 minutes of non-face-to-face time during this encounter.  This includes time for charting and coordination of care   Harriette Ohara, MD  I Gardiner Coins am acting as a scribe for Dr.Vinay Gudena  I have reviewed the above documentation for accuracy and completeness, and I agree with the above.

## 2022-07-23 ENCOUNTER — Inpatient Hospital Stay: Payer: 59 | Attending: Hematology and Oncology

## 2022-07-23 DIAGNOSIS — Z79899 Other long term (current) drug therapy: Secondary | ICD-10-CM | POA: Insufficient documentation

## 2022-07-23 DIAGNOSIS — D500 Iron deficiency anemia secondary to blood loss (chronic): Secondary | ICD-10-CM | POA: Diagnosis not present

## 2022-07-23 DIAGNOSIS — K51918 Ulcerative colitis, unspecified with other complication: Secondary | ICD-10-CM | POA: Diagnosis not present

## 2022-07-23 LAB — CBC WITH DIFFERENTIAL (CANCER CENTER ONLY)
Abs Immature Granulocytes: 0.07 10*3/uL (ref 0.00–0.07)
Basophils Absolute: 0 10*3/uL (ref 0.0–0.1)
Basophils Relative: 0 %
Eosinophils Absolute: 0 10*3/uL (ref 0.0–0.5)
Eosinophils Relative: 0 %
HCT: 35.3 % — ABNORMAL LOW (ref 39.0–52.0)
Hemoglobin: 11.3 g/dL — ABNORMAL LOW (ref 13.0–17.0)
Immature Granulocytes: 1 %
Lymphocytes Relative: 5 %
Lymphs Abs: 0.6 10*3/uL — ABNORMAL LOW (ref 0.7–4.0)
MCH: 25.9 pg — ABNORMAL LOW (ref 26.0–34.0)
MCHC: 32 g/dL (ref 30.0–36.0)
MCV: 80.8 fL (ref 80.0–100.0)
Monocytes Absolute: 0.2 10*3/uL (ref 0.1–1.0)
Monocytes Relative: 2 %
Neutro Abs: 11.5 10*3/uL — ABNORMAL HIGH (ref 1.7–7.7)
Neutrophils Relative %: 92 %
Platelet Count: 436 10*3/uL — ABNORMAL HIGH (ref 150–400)
RBC: 4.37 MIL/uL (ref 4.22–5.81)
RDW: 14 % (ref 11.5–15.5)
WBC Count: 12.5 10*3/uL — ABNORMAL HIGH (ref 4.0–10.5)
nRBC: 0 % (ref 0.0–0.2)

## 2022-07-23 LAB — FERRITIN: Ferritin: 5 ng/mL — ABNORMAL LOW (ref 24–336)

## 2022-07-23 LAB — IRON AND IRON BINDING CAPACITY (CC-WL,HP ONLY)
Iron: 18 ug/dL — ABNORMAL LOW (ref 45–182)
Saturation Ratios: 4 % — ABNORMAL LOW (ref 17.9–39.5)
TIBC: 472 ug/dL — ABNORMAL HIGH (ref 250–450)
UIBC: 454 ug/dL — ABNORMAL HIGH (ref 117–376)

## 2022-07-26 ENCOUNTER — Inpatient Hospital Stay: Payer: 59 | Admitting: Hematology and Oncology

## 2022-07-26 DIAGNOSIS — D500 Iron deficiency anemia secondary to blood loss (chronic): Secondary | ICD-10-CM | POA: Diagnosis not present

## 2022-07-26 NOTE — Assessment & Plan Note (Signed)
Etiology: Bleeding from ulcerative colitis Diagnosis of ulcerative colitis: July 2023 (hospitalization with abdominal pain and severe anemia requiring blood transfusion, iron infusions)   Lab review: Prior to June 2023: He had excellent labs 02/10/2022: Ferritin 9.1, iron saturation 4.8%, hemoglobin 8.1, platelets 501, MCV 78.6 04/20/2022: Hemoglobin 12.2, MCV 81.7, platelets 367, iron saturation 7%, TIBC 472, ferritin 46 07/23/2022: Hemoglobin 11.3, MCV 80.8, platelets 436, iron saturation 4%, TIBC 472, ferritin 5    IV iron: August 2023, November 2023  I recommended 3 more doses of IV iron Recheck labs in 3 months and telephone visit after that to discuss results

## 2022-07-30 ENCOUNTER — Telehealth: Payer: Self-pay | Admitting: Family Medicine

## 2022-07-30 NOTE — Telephone Encounter (Signed)
Scheduled appointment per LOS 3/4 Patient scheduled with [facility] and is aware of date and times of appointments.

## 2022-07-30 NOTE — Telephone Encounter (Signed)
Per 3/4 LOS scheduled patient. Patient is aware of date and time of appointment.

## 2022-08-05 ENCOUNTER — Inpatient Hospital Stay: Payer: 59

## 2022-08-05 VITALS — BP 126/86 | HR 86 | Temp 98.6°F | Resp 19

## 2022-08-05 DIAGNOSIS — D500 Iron deficiency anemia secondary to blood loss (chronic): Secondary | ICD-10-CM | POA: Diagnosis not present

## 2022-08-05 MED ORDER — SODIUM CHLORIDE 0.9 % IV SOLN
300.0000 mg | Freq: Once | INTRAVENOUS | Status: AC
  Administered 2022-08-05: 300 mg via INTRAVENOUS
  Filled 2022-08-05: qty 5

## 2022-08-05 MED ORDER — SODIUM CHLORIDE 0.9 % IV SOLN: Freq: Once | INTRAVENOUS | Status: AC

## 2022-08-05 NOTE — Patient Instructions (Signed)

## 2022-08-12 ENCOUNTER — Ambulatory Visit (INDEPENDENT_AMBULATORY_CARE_PROVIDER_SITE_OTHER): Payer: 59 | Admitting: *Deleted

## 2022-08-12 ENCOUNTER — Inpatient Hospital Stay: Payer: 59

## 2022-08-12 VITALS — BP 133/79 | HR 92 | Temp 98.1°F | Resp 18 | Ht 63.0 in | Wt 211.4 lb

## 2022-08-12 VITALS — BP 121/79 | HR 92 | Temp 98.6°F | Resp 17 | Ht 63.0 in | Wt 212.4 lb

## 2022-08-12 DIAGNOSIS — D500 Iron deficiency anemia secondary to blood loss (chronic): Secondary | ICD-10-CM | POA: Diagnosis not present

## 2022-08-12 DIAGNOSIS — K51919 Ulcerative colitis, unspecified with unspecified complications: Secondary | ICD-10-CM | POA: Diagnosis not present

## 2022-08-12 DIAGNOSIS — K921 Melena: Secondary | ICD-10-CM

## 2022-08-12 MED ORDER — SODIUM CHLORIDE 0.9 % IV SOLN
300.0000 mg | Freq: Once | INTRAVENOUS | Status: AC
  Administered 2022-08-12: 300 mg via INTRAVENOUS
  Filled 2022-08-12: qty 200

## 2022-08-12 MED ORDER — VEDOLIZUMAB 300 MG IV SOLR
300.0000 mg | Freq: Once | INTRAVENOUS | Status: AC
  Administered 2022-08-12: 300 mg via INTRAVENOUS
  Filled 2022-08-12: qty 5

## 2022-08-12 MED ORDER — SODIUM CHLORIDE 0.9 % IV SOLN: Freq: Once | INTRAVENOUS | Status: AC

## 2022-08-12 NOTE — Patient Instructions (Signed)

## 2022-08-12 NOTE — Progress Notes (Signed)
Diagnosis: Crohn's Disease  Provider:  Marshell Garfinkel MD  Procedure: Infusion  IV Type: Peripheral, IV Location: R Antecubital  Entyvio (Vedolizumab), Dose: 300 mg  Infusion Start Time: [id number] am  Infusion Stop Time: [id number] am  Post Infusion IV Care: Observation period completed and Peripheral IV Discontinued  Discharge: Condition: Good, Destination: Home . AVS Provided and AVS Declined  Performed by:  Oren Beckmann, RN

## 2022-08-19 ENCOUNTER — Inpatient Hospital Stay: Payer: 59

## 2022-08-19 VITALS — BP 119/77 | HR 94 | Temp 98.4°F | Resp 18 | Ht 63.0 in | Wt 214.0 lb

## 2022-08-19 DIAGNOSIS — D500 Iron deficiency anemia secondary to blood loss (chronic): Secondary | ICD-10-CM

## 2022-08-19 MED ORDER — SODIUM CHLORIDE 0.9 % IV SOLN: Freq: Once | INTRAVENOUS | Status: AC

## 2022-08-19 MED ORDER — SODIUM CHLORIDE 0.9 % IV SOLN
300.0000 mg | Freq: Once | INTRAVENOUS | Status: AC
  Administered 2022-08-19: 300 mg via INTRAVENOUS
  Filled 2022-08-19: qty 200

## 2022-08-19 NOTE — Patient Instructions (Signed)

## 2022-10-07 ENCOUNTER — Ambulatory Visit (INDEPENDENT_AMBULATORY_CARE_PROVIDER_SITE_OTHER): Payer: 59 | Admitting: *Deleted

## 2022-10-07 VITALS — BP 132/82 | HR 100 | Temp 98.0°F | Resp 16 | Ht 63.0 in | Wt 214.8 lb

## 2022-10-07 DIAGNOSIS — K921 Melena: Secondary | ICD-10-CM | POA: Diagnosis not present

## 2022-10-07 DIAGNOSIS — K51919 Ulcerative colitis, unspecified with unspecified complications: Secondary | ICD-10-CM | POA: Diagnosis not present

## 2022-10-07 MED ORDER — VEDOLIZUMAB 300 MG IV SOLR
300.0000 mg | Freq: Once | INTRAVENOUS | Status: AC
  Administered 2022-10-07: 300 mg via INTRAVENOUS
  Filled 2022-10-07: qty 5

## 2022-10-07 NOTE — Progress Notes (Signed)
Diagnosis: Crohn's Disease  Provider:  Chilton Greathouse MD  Procedure: IV Infusion  IV Type: Peripheral, IV Location: R Antecubital  Entyvio (Vedolizumab), Dose: 300 mg  Infusion Start Time: 1033 am  Infusion Stop Time: 1112 am  Post Infusion IV Care: Observation period completed and Peripheral IV Discontinued  Discharge: Condition: Good, Destination: Home . AVS Provided  Performed by:  Forrest Moron, RN

## 2022-10-21 ENCOUNTER — Telehealth: Payer: Self-pay | Admitting: Hematology and Oncology

## 2022-10-21 NOTE — Telephone Encounter (Signed)
Rescheduled appointment per provider PAL. Patient is aware of the changes made to his upcoming appointment. 

## 2022-10-27 ENCOUNTER — Encounter: Payer: Self-pay | Admitting: Gastroenterology

## 2022-11-01 ENCOUNTER — Other Ambulatory Visit: Payer: Self-pay

## 2022-11-01 ENCOUNTER — Inpatient Hospital Stay: Payer: 59 | Attending: Hematology and Oncology

## 2022-11-01 DIAGNOSIS — Z8049 Family history of malignant neoplasm of other genital organs: Secondary | ICD-10-CM | POA: Diagnosis not present

## 2022-11-01 DIAGNOSIS — D509 Iron deficiency anemia, unspecified: Secondary | ICD-10-CM | POA: Insufficient documentation

## 2022-11-01 DIAGNOSIS — Z833 Family history of diabetes mellitus: Secondary | ICD-10-CM | POA: Diagnosis not present

## 2022-11-01 DIAGNOSIS — D500 Iron deficiency anemia secondary to blood loss (chronic): Secondary | ICD-10-CM

## 2022-11-01 DIAGNOSIS — R5383 Other fatigue: Secondary | ICD-10-CM | POA: Insufficient documentation

## 2022-11-01 DIAGNOSIS — Z8249 Family history of ischemic heart disease and other diseases of the circulatory system: Secondary | ICD-10-CM | POA: Diagnosis not present

## 2022-11-01 DIAGNOSIS — Z8349 Family history of other endocrine, nutritional and metabolic diseases: Secondary | ICD-10-CM | POA: Insufficient documentation

## 2022-11-01 DIAGNOSIS — Z8042 Family history of malignant neoplasm of prostate: Secondary | ICD-10-CM | POA: Insufficient documentation

## 2022-11-01 DIAGNOSIS — Z8261 Family history of arthritis: Secondary | ICD-10-CM | POA: Insufficient documentation

## 2022-11-01 DIAGNOSIS — Z79899 Other long term (current) drug therapy: Secondary | ICD-10-CM | POA: Insufficient documentation

## 2022-11-01 DIAGNOSIS — K51911 Ulcerative colitis, unspecified with rectal bleeding: Secondary | ICD-10-CM | POA: Insufficient documentation

## 2022-11-01 DIAGNOSIS — K2091 Esophagitis, unspecified with bleeding: Secondary | ICD-10-CM | POA: Diagnosis not present

## 2022-11-01 LAB — CBC WITH DIFFERENTIAL (CANCER CENTER ONLY)
Abs Immature Granulocytes: 0 10*3/uL (ref 0.00–0.07)
Basophils Absolute: 0.1 10*3/uL (ref 0.0–0.1)
Basophils Relative: 1 %
Eosinophils Absolute: 0.2 10*3/uL (ref 0.0–0.5)
Eosinophils Relative: 3 %
HCT: 44.6 % (ref 39.0–52.0)
Hemoglobin: 15.3 g/dL (ref 13.0–17.0)
Immature Granulocytes: 0 %
Lymphocytes Relative: 33 %
Lymphs Abs: 2.4 10*3/uL (ref 0.7–4.0)
MCH: 28.9 pg (ref 26.0–34.0)
MCHC: 34.3 g/dL (ref 30.0–36.0)
MCV: 84.3 fL (ref 80.0–100.0)
Monocytes Absolute: 0.5 10*3/uL (ref 0.1–1.0)
Monocytes Relative: 7 %
Neutro Abs: 4 10*3/uL (ref 1.7–7.7)
Neutrophils Relative %: 56 %
Platelet Count: 325 10*3/uL (ref 150–400)
RBC: 5.29 MIL/uL (ref 4.22–5.81)
RDW: 13.9 % (ref 11.5–15.5)
WBC Count: 7.2 10*3/uL (ref 4.0–10.5)
nRBC: 0 % (ref 0.0–0.2)

## 2022-11-01 LAB — IRON AND IRON BINDING CAPACITY (CC-WL,HP ONLY)
Iron: 56 ug/dL (ref 45–182)
Saturation Ratios: 15 % — ABNORMAL LOW (ref 17.9–39.5)
TIBC: 367 ug/dL (ref 250–450)
UIBC: 311 ug/dL (ref 117–376)

## 2022-11-01 LAB — FERRITIN: Ferritin: 28 ng/mL (ref 24–336)

## 2022-11-04 ENCOUNTER — Telehealth: Payer: 59 | Admitting: Hematology and Oncology

## 2022-11-08 ENCOUNTER — Inpatient Hospital Stay (HOSPITAL_BASED_OUTPATIENT_CLINIC_OR_DEPARTMENT_OTHER): Payer: 59 | Admitting: Adult Health

## 2022-11-08 ENCOUNTER — Encounter: Payer: Self-pay | Admitting: Adult Health

## 2022-11-08 DIAGNOSIS — D500 Iron deficiency anemia secondary to blood loss (chronic): Secondary | ICD-10-CM | POA: Diagnosis not present

## 2022-11-08 NOTE — Progress Notes (Signed)
[facility] Cancer Follow up:    Willie Leatherwood, DO [address] [place] [address]   DIAGNOSIS: Iron deficiency anemia  SUMMARY OF HEMATOLOGIC HISTORY: History of ulcerative colitis diagnosed in 11/2021 02/10/2022 diagnosed with Iron deficiency anemia with ferritin of 9, saturation 4.8% Receives IV iron intermittently; 12/2021, 03/2022, 07/2022  CURRENT THERAPY: Intermittent IV iron  INTERVAL HISTORY: Willie James 22 y.o. male returns for follow-up of his iron deficiency anemia.  He most recently received Venofer 300mg  weekly x 3 beginning 08/05/2022 due to decreased ferritin levels.  He returned on June 10 for labs and his hemoglobin increased from 11.3 in March to 15.3 in June, and his ferritin went from 5-28.  He tells me that he tolerated the IV Venofer without any difficulty.  Willie James reports his level of fatigue has much improved since receiving the IV iron.  He does continue to have intermittent hematochezia.  His next follow-up appointment with Dr. Barron Alvine is scheduled in September 2024.   Patient Active Problem List   Diagnosis Date Noted   Acute esophagitis    Ulcerative colitis (HCC) 12/15/2021   ABLA (acute blood loss anemia) 12/15/2021   SIRS (systemic inflammatory response syndrome) (HCC) 12/15/2021   Hematochezia 12/10/2021   IDA (iron deficiency anemia) 12/10/2021   Hypertriglyceridemia 01/07/2020   Obesity (BMI 30-39.9) 01/03/2018   Palpitations 01/03/2018    has No Known Allergies.  MEDICAL HISTORY: Past Medical History:  Diagnosis Date   Frequent headaches    Left ventricular hypertrophy by electrocardiogram 10/28/2015   echo after was normal.    Palpitations 10/30/2015   pt reports palpitaitons and chest discomfort. EKG (reported LVH) and then NORMAL echo completed.     SURGICAL HISTORY: Past Surgical History:  Procedure Laterality Date   BIOPSY  12/17/2021   Procedure: BIOPSY;  Surgeon: Beverley Fiedler, MD;  Location: [facility]  ENDOSCOPY;  Service: Gastroenterology;;  EGD and COLON   COLONOSCOPY N/A 12/17/2021   Procedure: COLONOSCOPY;  Surgeon: Beverley Fiedler, MD;  Location: [facility] ENDOSCOPY;  Service: Gastroenterology;  Laterality: N/A;   ESOPHAGOGASTRODUODENOSCOPY (EGD) WITH PROPOFOL N/A 12/17/2021   Procedure: ESOPHAGOGASTRODUODENOSCOPY (EGD) WITH PROPOFOL;  Surgeon: Beverley Fiedler, MD;  Location: [facility] ENDOSCOPY;  Service: Gastroenterology;  Laterality: N/A;   NO PAST SURGERIES      SOCIAL HISTORY: Social History   Socioeconomic History   Marital status: Single    Spouse name: Not on file   Number of children: Not on file   Years of education: Not on file   Highest education level: Some college, no degree  Occupational History   Not on file  Tobacco Use   Smoking status: Never   Smokeless tobacco: Never  Vaping Use   Vaping Use: Never used  Substance and Sexual Activity   Alcohol use: Never   Drug use: Never   Sexual activity: Never  Other Topics Concern   Not on file  Social History Narrative   Marital status/children/pets: Single.    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[employer])   Safety:      -Wears a bicycle helmet riding a bike: Yes     -smoke alarm in the home:Yes     - wears seatbelt: Yes     - Feels safe in their relationships: Yes   Social Determinants of Health   Financial Resource Strain: Low Risk  (11/02/2021)   Overall Financial Resource Strain (CARDIA)    Difficulty of Paying Living Expenses: Not hard at all  Food Insecurity:  No Food Insecurity (11/02/2021)   Hunger Vital Sign    Worried About Running Out of Food in the Last Year: Never true    Ran Out of Food in the Last Year: Never true  Transportation Needs: No Transportation Needs (11/02/2021)   PRAPARE - Administrator, Civil Service (Medical): No    Lack of Transportation (Non-Medical): No  Physical Activity: Unknown (11/02/2021)   Exercise Vital Sign    Days of Exercise per Week: 0 days    Minutes of Exercise per  Session: Not on file  Stress: No Stress Concern Present (11/02/2021)   [employer] of Occupational Health - Occupational Stress Questionnaire    Feeling of Stress : Not at all  Social Connections: Socially Isolated (11/02/2021)   Social Connection and Isolation Panel [NHANES]    Frequency of Communication with Friends and Family: Never    Frequency of Social Gatherings with Friends and Family: More than three times a week    Attends Religious Services: Never    Database administrator or Organizations: No    Attends Engineer, structural: Not on file    Marital Status: Never married  Catering manager Violence: Not on file    FAMILY HISTORY: Family History  Problem Relation Age of Onset   Hyperlipidemia Father    Arthritis Maternal Grandmother    Diabetes Maternal Grandmother    Hyperlipidemia Maternal Grandmother    Hypertension Maternal Grandmother    Hypertension Maternal Grandfather    Hyperlipidemia Maternal Grandfather    Prostate cancer Maternal Grandfather    Cervical cancer Paternal Grandmother    Hypertension Paternal Grandfather    Heart disease Paternal Grandfather    Colon cancer Neg Hx    Esophageal cancer Neg Hx     Review of Systems  Constitutional:  Negative for appetite change, chills, fatigue, fever and unexpected weight change.  HENT:   Negative for hearing loss, lump/mass and trouble swallowing.   Eyes:  Negative for eye problems and icterus.  Respiratory:  Negative for chest tightness, cough and shortness of breath.   Cardiovascular:  Negative for chest pain, leg swelling and palpitations.  Gastrointestinal:  Negative for abdominal distention, abdominal pain, constipation, diarrhea, nausea and vomiting.  Endocrine: Negative for hot flashes.  Genitourinary:  Negative for difficulty urinating.   Musculoskeletal:  Negative for arthralgias.  Skin:  Negative for itching and rash.  Neurological:  Negative for dizziness, extremity weakness,  headaches and numbness.  Hematological:  Negative for adenopathy. Does not bruise/bleed easily.  Psychiatric/Behavioral:  Negative for depression. The patient is not nervous/anxious.       PHYSICAL EXAMINATION Patient sounds well.  They are in no apparent distress.  Mood and behavior are normal.  Speech is normal.   LABORATORY DATA:  CBC    Component Value Date/Time   WBC 7.2 11/01/2022 1344   WBC 4.1 03/11/2022 1545   RBC 5.29 11/01/2022 1344   HGB 15.3 11/01/2022 1344   HCT 44.6 11/01/2022 1344   PLT 325 11/01/2022 1344   MCV 84.3 11/01/2022 1344   MCH 28.9 11/01/2022 1344   MCHC 34.3 11/01/2022 1344   RDW 13.9 11/01/2022 1344   LYMPHSABS 2.4 11/01/2022 1344   MONOABS 0.5 11/01/2022 1344   EOSABS 0.2 11/01/2022 1344   BASOSABS 0.1 11/01/2022 1344    CMP     Component Value Date/Time   NA 136 01/11/2022 1555   K 4.0 01/11/2022 1555   CL 101 01/11/2022 1555  CO2 25 01/11/2022 1555   GLUCOSE 167 (H) 01/11/2022 1555   BUN 15 01/11/2022 1555   CREATININE 0.75 01/11/2022 1555   CALCIUM 8.9 01/11/2022 1555   PROT 7.9 03/04/2022 1202   ALBUMIN 4.4 03/04/2022 1202   AST 12 03/04/2022 1202   ALT 12 03/04/2022 1202   ALKPHOS 55 03/04/2022 1202   BILITOT 0.2 03/04/2022 1202   GFRNONAA >60 12/17/2021 0824         ASSESSMENT and THERAPY PLAN:   IDA (iron deficiency anemia) Willie James is a 22 year old male with iron deficiency anemia related to his history of ulcerative colitis.  His most recent iron levels are stable.  He does not require any iron infusions at this point.  I recommended that we repeat his lab testing in 12 weeks.  I also recommended that he continue to follow-up with Dr. Barron Alvine, his gastroenterologist.  RTC virtual visit after lab testing in 12 weeks.   Follow up instructions:    -Return to cancer center in 12 weeks   The patient was provided an opportunity to ask questions and all were answered. The patient agreed with the plan and  demonstrated an understanding of the instructions.   The patient was advised to call back or seek an in-person evaluation if the symptoms worsen or if the condition fails to improve as anticipated.   I provided 10 minutes of non face-to-face telephone visit time during this encounter, and > 50% was spent counseling as documented under my assessment & plan.   Lillard Anes, NP 11/08/22 4:00 PM Medical Oncology and Hematology [facility] [address] [place], [place] [address] Tel. [phone]    Fax. [phone]  *Total Encounter Time as defined by the Centers for Medicare and Medicaid Services includes, in addition to the face-to-face time of a patient visit (documented in the note above) non-face-to-face time: obtaining and reviewing outside history, ordering and reviewing medications, tests or procedures, care coordination (communications with other health care professionals or caregivers) and documentation in the medical record.

## 2022-11-08 NOTE — Assessment & Plan Note (Signed)
Willie James is a 22 year old male with iron deficiency anemia related to his history of ulcerative colitis.  His most recent iron levels are stable.  He does not require any iron infusions at this point.  I recommended that we repeat his lab testing in 12 weeks.  I also recommended that he continue to follow-up with Dr. Barron Alvine, his gastroenterologist.  RTC virtual visit after lab testing in 12 weeks.

## 2022-11-11 ENCOUNTER — Telehealth: Payer: Self-pay | Admitting: Adult Health

## 2022-11-11 NOTE — Telephone Encounter (Signed)
Scheduled appointments per los. Patient is aware of the made appointments. 

## 2022-11-29 ENCOUNTER — Encounter: Payer: Self-pay | Admitting: Family Medicine

## 2022-11-29 ENCOUNTER — Ambulatory Visit (INDEPENDENT_AMBULATORY_CARE_PROVIDER_SITE_OTHER): Payer: 59 | Admitting: Family Medicine

## 2022-11-29 VITALS — BP 119/87 | HR 107 | Temp 99.3°F | Ht 64.17 in | Wt 212.4 lb

## 2022-11-29 DIAGNOSIS — Z0000 Encounter for general adult medical examination without abnormal findings: Secondary | ICD-10-CM

## 2022-11-29 DIAGNOSIS — D500 Iron deficiency anemia secondary to blood loss (chronic): Secondary | ICD-10-CM

## 2022-11-29 DIAGNOSIS — K51919 Ulcerative colitis, unspecified with unspecified complications: Secondary | ICD-10-CM

## 2022-11-29 DIAGNOSIS — E781 Pure hyperglyceridemia: Secondary | ICD-10-CM | POA: Diagnosis not present

## 2022-11-29 LAB — COMPREHENSIVE METABOLIC PANEL
ALT: 14 U/L (ref 0–53)
AST: 16 U/L (ref 0–37)
Albumin: 4.5 g/dL (ref 3.5–5.2)
Alkaline Phosphatase: 77 U/L (ref 39–117)
BUN: 15 mg/dL (ref 6–23)
CO2: 23 mEq/L (ref 19–32)
Calcium: 9.6 mg/dL (ref 8.4–10.5)
Chloride: 102 mEq/L (ref 96–112)
Creatinine, Ser: 0.94 mg/dL (ref 0.40–1.50)
GFR: 114.01 mL/min (ref >60.00)
Glucose, Bld: 93 mg/dL (ref 70–99)
Potassium: 3.9 mEq/L (ref 3.5–5.1)
Sodium: 137 mEq/L (ref 135–145)
Total Bilirubin: 0.6 mg/dL (ref 0.2–1.2)
Total Protein: 7.5 g/dL (ref 6.0–8.3)

## 2022-11-29 LAB — VITAMIN D 25 HYDROXY (VIT D DEFICIENCY, FRACTURES): VITD: 7.88 ng/mL — ABNORMAL LOW (ref 30.00–100.00)

## 2022-11-29 LAB — LIPID PANEL
Cholesterol: 174 mg/dL (ref 0–200)
HDL: 29.1 mg/dL — ABNORMAL LOW (ref >39.00)
Total CHOL/HDL Ratio: 6
Triglycerides: 403 mg/dL — ABNORMAL HIGH (ref 0.0–149.0)

## 2022-11-29 LAB — LDL CHOLESTEROL, DIRECT: Direct LDL: 70 mg/dL

## 2022-11-29 LAB — TSH: TSH: 2.24 u[IU]/mL (ref 0.35–5.50)

## 2022-11-29 NOTE — Progress Notes (Signed)
Patient ID: Willie James, male  DOB: 12-14-2000, 22 y.o.   MRN: [id number] Patient Care Team    Relationship Specialty Notifications Start End  [name], DO PCP - General Family Medicine  11/13/21   Janalyn Harder, MD (Inactive) Consulting Physician Dermatology  09/24/20     Chief Complaint  Patient presents with   Annual Exam    Pt is fasting    Subjective: Willie James is a 22 y.o.  male  present for CPE. All past medical history, surgical history, allergies, family history, immunizations, medications and social history were updated in the electronic medical record today. All recent labs, ED visits and hospitalizations within the last year were reviewed.   Health maintenance:  Colonoscopy: no fhx, routine screen at 43 Immunizations: tdap UTD 2017, Influenza UTD (encouraged yearly), COVID series completed Infectious disease screening: HIV not indicated (never SA) Patient has a Dental home. Hospitalizations/ED visits: reviewed     11/29/2022    8:09 AM 10/20/2021    1:58 PM 04/04/2020    2:55 PM 01/07/2020    9:55 AM 01/05/2019   10:03 AM  Depression screen PHQ 2/9  Decreased Interest 0 0 0 0 0  Down, Depressed, Hopeless 0 0 0 0 0  PHQ - 2 Score 0 0 0 0 0       No data to display            Immunization History  Administered Date(s) Administered   DTaP 04/13/1999, 06/22/1999, 08/04/1999, 09/02/2000, 03/19/2004   HIB (PRP-OMP) 04/13/1999, 05/29/1999, 08/04/1999, 05/27/2000   HPV 9-valent 10/27/2015   HPV Bivalent 05/05/2002, 02/25/2012, 09/22/2012   HPV Quadrivalent 02/25/2012, 05/05/2012, 09/22/2012, 10/27/2015   Hepatitis A 02/07/2009, 08/15/2009   Hepatitis B Feb 28, 1999, 03/16/1999, 09/25/1999   Hepatitis B, PED/ADOLESCENT 2000-08-16, 03/16/1999, 09/25/1999   IPV 05/29/1999, 06/22/1999, 09/25/1999, 03/19/2004   Influenza Inj Mdck Quad Pf 01/19/2019   Influenza Split 03/14/2010, 04/08/2011, 03/17/2013   Influenza,inj,Quad PF,6+ Mos 06/04/2018,  01/24/2021   Influenza,inj,quad, With Preservative 04/09/2016   Influenza-Unspecified 04/27/2008, 02/07/2009, 03/14/2010, 04/08/2011, 03/17/2013, 04/09/2016, 02/21/2017, 06/04/2018, 01/19/2019, 02/22/2020   MMR 05/27/2000, 02/04/2003   Meningococcal Conjugate 01/11/2011, 10/27/2015   Meningococcal polysaccharide vaccine (MPSV4) 10/27/2015   Moderna Sars-Covid-2 Vaccination 08/29/2019, 09/29/2019   Pneumococcal Conjugate-13 09/25/1999, 12/25/1999, 09/02/2000   Tdap 01/11/2011, 10/27/2015   Varicella 02/05/2000, 03/14/2010, 10/27/2015     Past Medical History:  Diagnosis Date   Frequent headaches    Left ventricular hypertrophy by electrocardiogram 10/28/2015   echo after was normal.    Palpitations 10/30/2015   pt reports palpitaitons and chest discomfort. EKG (reported LVH) and then NORMAL echo completed.    No Known Allergies Past Surgical History:  Procedure Laterality Date   BIOPSY  12/17/2021   Procedure: BIOPSY;  Surgeon: Beverley Fiedler, MD;  Location: [facility];  Service: Gastroenterology;;  EGD and COLON   COLONOSCOPY N/A 12/17/2021   Procedure: COLONOSCOPY;  Surgeon: Beverley Fiedler, MD;  Location: [facility];  Service: Gastroenterology;  Laterality: N/A;   ESOPHAGOGASTRODUODENOSCOPY (EGD) WITH PROPOFOL N/A 12/17/2021   Procedure: ESOPHAGOGASTRODUODENOSCOPY (EGD) WITH PROPOFOL;  Surgeon: Beverley Fiedler, MD;  Location: [facility];  Service: Gastroenterology;  Laterality: N/A;   NO PAST SURGERIES     Family History  Problem Relation Age of Onset   Hyperlipidemia Father    Arthritis Maternal Grandmother    Diabetes Maternal Grandmother    Hyperlipidemia Maternal Grandmother    Hypertension Maternal Grandmother    Hypertension Maternal Grandfather  Hyperlipidemia Maternal Grandfather    Prostate cancer Maternal Grandfather    Cervical cancer Paternal Grandmother    Hypertension Paternal Grandfather    Heart disease Paternal Grandfather    Colon cancer Neg Hx     Esophageal cancer Neg Hx    Social History   Social History Narrative   Marital status/children/pets: Single.    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 If you have any questions, ask your nurse or doctor.          vedolizumab 300 MG injection Commonly known as: ENTYVIO Inject into the vein.        All past medical history, surgical history, allergies, family history, immunizations andmedications were updated in the EMR today and reviewed under the history and medication portions of their EMR.     Recent Results (from the past 2160 hour(s))  Iron and Iron Binding Capacity ([facility] only)     Status: Abnormal   Collection Time: 11/01/22  1:44 PM  Result Value Ref Range   Iron 56 45 - 182 ug/dL   TIBC 161 096 - 045 ug/dL   Saturation Ratios 15 (L) 17.9 - 39.5 %   UIBC 311 117 - 376 ug/dL    Comment: Performed at [facility], [address] [place], [place] [address]  Ferritin     Status: None   Collection Time: 11/01/22  1:44 PM  Result Value Ref Range   Ferritin 28 24 - 336 ng/mL    Comment: Performed at [facility], [address], [place], [place] [address]  CBC with Differential (Cancer Center Only)     Status: None   Collection Time: 11/01/22  1:44 PM  Result Value Ref Range   WBC Count 7.2 4.0 - 10.5 K/uL   RBC 5.29 4.22 - 5.81 MIL/uL   Hemoglobin 15.3 13.0 - 17.0 g/dL   HCT 82.9 56.2 - 13.0 %   MCV 84.3 80.0 - 100.0 fL   MCH 28.9 26.0 - 34.0 pg   MCHC 34.3 30.0 - 36.0 g/dL   RDW 86.5 78.4 - 69.6 %   Platelet Count 325 150 - 400 K/uL   nRBC 0.0 0.0 - 0.2 %   Neutrophils Relative % 56 %   Neutro Abs 4.0 1.7 - 7.7 K/uL    Lymphocytes Relative 33 %   Lymphs Abs 2.4 0.7 - 4.0 K/uL   Monocytes Relative 7 %   Monocytes Absolute 0.5 0.1 - 1.0 K/uL   Eosinophils Relative 3 %   Eosinophils Absolute 0.2 0.0 - 0.5 K/uL   Basophils Relative 1 %   Basophils Absolute 0.1 0.0 - 0.1 K/uL   Immature Granulocytes 0 %   Abs Immature Granulocytes 0.00 0.00 - 0.07 K/uL    Comment: Performed at [facility], [address] [place], [place] [address]    Patient was never admitted.   ROS: 14 pt review of systems performed and negative (unless mentioned in an HPI)  Objective: BP 119/87   Pulse (!) 107  Temp 99.3 F (37.4 C)   Ht 5' 4.17" (1.63 m)   Wt 212 lb 6.4 oz (96.3 kg)   SpO2 97%   BMI 36.26 kg/m  Physical Exam Constitutional:      General: He is not in acute distress.    Appearance: Normal appearance. He is not ill-appearing, toxic-appearing or diaphoretic.  HENT:     Head: Normocephalic and atraumatic.     Right Ear: Tympanic membrane, ear canal and external ear normal. There is no impacted cerumen.     Left Ear: Tympanic membrane, ear canal and external ear normal. There is no impacted cerumen.     Nose: Nose normal. No congestion or rhinorrhea.     Mouth/Throat:     Mouth: Mucous membranes are moist.     Pharynx: Oropharynx is clear. No oropharyngeal exudate or posterior oropharyngeal erythema.  Eyes:     General: No scleral icterus.       Right eye: No discharge.        Left eye: No discharge.     Extraocular Movements: Extraocular movements intact.     Pupils: Pupils are equal, round, and reactive to light.  Cardiovascular:     Rate and Rhythm: Normal rate and regular rhythm.     Pulses: Normal pulses.     Heart sounds: Normal heart sounds. No murmur heard.    No friction rub. No gallop.  Pulmonary:     Effort: Pulmonary effort is normal. No respiratory distress.     Breath sounds: Normal breath sounds. No stridor. No wheezing, rhonchi or rales.  Chest:     Chest  wall: No tenderness.  Abdominal:     General: Abdomen is flat. Bowel sounds are normal. There is no distension.     Palpations: Abdomen is soft. There is no mass.     Tenderness: There is no abdominal tenderness. There is no right CVA tenderness, left CVA tenderness, guarding or rebound.     Hernia: No hernia is present.  Musculoskeletal:        General: No swelling or tenderness. Normal range of motion.     Cervical back: Normal range of motion and neck supple.     Right lower leg: No edema.     Left lower leg: No edema.  Lymphadenopathy:     Cervical: No cervical adenopathy.  Skin:    General: Skin is warm and dry.     Coloration: Skin is not jaundiced.     Findings: No bruising, lesion or rash.  Neurological:     General: No focal deficit present.     Mental Status: He is alert and oriented to person, place, and time. Mental status is at baseline.     Cranial Nerves: No cranial nerve deficit.     Sensory: No sensory deficit.     Motor: No weakness.     Coordination: Coordination normal.     Gait: Gait normal.     Deep Tendon Reflexes: Reflexes normal.  Psychiatric:        Mood and Affect: Mood normal.        Behavior: Behavior normal.        Thought Content: Thought content normal.        Judgment: Judgment normal.     No results found.  Assessment/plan: Willie James is a 22 y.o. male present for  Encounter for preventive health examination Patient was encouraged to exercise greater than 150 minutes a week. Patient was encouraged to choose a diet  filled with fresh fruits and vegetables, and lean meats. AVS provided to patient today for education/recommendation on gender specific health and safety maintenance. Colonoscopy: no fhx, routine screen at 43 Immunizations: tdap UTD 2017, Influenza UTD (encouraged yearly)  Ulcerative colitis with complication, unspecified location [facility]) Managed by GI - Vitamin D (25 hydroxy)  Hypertriglyceridemia Diet and exercise  controlled.  - Comprehensive metabolic panel - TSH - Lipid panel  Iron deficiency anemia due to chronic blood loss Managed by GI- reviewed recent labs  Return in about 1 year (around 11/30/2023) for cpe (20 min).   Orders Placed This Encounter  Procedures   Comprehensive metabolic panel   TSH   Lipid panel   Vitamin D (25 hydroxy)   No orders of the defined types were placed in this encounter.  Referral Orders  No referral(s) requested today     Electronically signed by: Felix Pacini, DO [facility] [place]

## 2022-11-29 NOTE — Patient Instructions (Addendum)
Return in about 1 year (around 11/30/2023) for cpe (20 min).        Great to see you today.  I have refilled the medication(s) we provide.   If labs were collected, we will inform you of lab results once received either by echart message or telephone call.   - echart message- for normal results that have been seen by the patient already.   - telephone call: abnormal results or if patient has not viewed results in their echart.  

## 2022-11-30 ENCOUNTER — Telehealth: Payer: Self-pay | Admitting: Family Medicine

## 2022-11-30 DIAGNOSIS — E559 Vitamin D deficiency, unspecified: Secondary | ICD-10-CM | POA: Insufficient documentation

## 2022-11-30 MED ORDER — VITAMIN D (ERGOCALCIFEROL) 1.25 MG (50000 UNIT) PO CAPS: 50000.0000 [IU] | ORAL_CAPSULE | ORAL | 0 refills | Status: DC

## 2022-11-30 NOTE — Telephone Encounter (Signed)
Please call patient: Liver function, kidney function, thyroid function are all normal. Electrolytes are normal. Triglycerides are elevated.  The rest of his cholesterol panel is great.  I would encourage him to add a 2000 mg omega-3 supplement.  This is over-the-counter and can be taken nightly.  Also increasing fiber in the diet or adding supplement can also help bring down triglyceride levels goal triglyceride levels are less than 150, and his is 403.  Lastly, his vitamin D is extremely low at 7.88.  Normal is greater than 30.  I have called in a once weekly vitamin D supplementation for him to take for 12 weeks.  He should take this on the same day of the week for 12 weeks, follow-up in 12-13 weeks with this provider for recheck.  -And thank him for bringing the incorrect information in his note to my attention.  Please inform him that has been changed.  Assure him that I know he is male, and that it was an error from the dictation software.

## 2022-11-30 NOTE — Telephone Encounter (Signed)
Spoke with patient regarding results/recommendations.  

## 2022-12-02 ENCOUNTER — Ambulatory Visit (INDEPENDENT_AMBULATORY_CARE_PROVIDER_SITE_OTHER): Payer: 59

## 2022-12-02 VITALS — BP 121/82 | HR 83 | Temp 98.1°F | Resp 16 | Ht 63.0 in | Wt 215.1 lb

## 2022-12-02 DIAGNOSIS — K51919 Ulcerative colitis, unspecified with unspecified complications: Secondary | ICD-10-CM | POA: Diagnosis not present

## 2022-12-02 DIAGNOSIS — K921 Melena: Secondary | ICD-10-CM

## 2022-12-02 MED ORDER — VEDOLIZUMAB 300 MG IV SOLR
300.0000 mg | Freq: Once | INTRAVENOUS | Status: AC
  Administered 2022-12-02: 300 mg via INTRAVENOUS
  Filled 2022-12-02: qty 5

## 2022-12-02 NOTE — Progress Notes (Signed)
Diagnosis: Crohn's Disease  Provider:  Chilton Greathouse MD  Procedure: IV Infusion  IV Type: Peripheral, IV Location: L Hand  Entyvio (Vedolizumab), Dose: 300 mg  Infusion Start Time: 1118  Infusion Stop Time: 1149  Post Infusion IV Care: Patient declined observation and Peripheral IV Discontinued  Discharge: Condition: Good, Destination: Home . AVS Declined  Performed by:  Marlow Baars [place], RN

## 2023-01-27 ENCOUNTER — Ambulatory Visit (INDEPENDENT_AMBULATORY_CARE_PROVIDER_SITE_OTHER): Payer: 59

## 2023-01-27 VITALS — BP 121/82 | HR 85 | Temp 98.5°F | Resp 20 | Ht 63.0 in | Wt 214.4 lb

## 2023-01-27 DIAGNOSIS — K51919 Ulcerative colitis, unspecified with unspecified complications: Secondary | ICD-10-CM

## 2023-01-27 DIAGNOSIS — K921 Melena: Secondary | ICD-10-CM | POA: Diagnosis not present

## 2023-01-27 MED ORDER — VEDOLIZUMAB 300 MG IV SOLR
300.0000 mg | Freq: Once | INTRAVENOUS | Status: AC
  Administered 2023-01-27: 300 mg via INTRAVENOUS
  Filled 2023-01-27: qty 0

## 2023-01-27 NOTE — Progress Notes (Signed)
Diagnosis: Ulcerative colitis with complication, unspecified location [facility]) [K51.919]  Provider:  Chilton Greathouse MD  Procedure: IV Infusion  IV Type: Peripheral, IV Location: R Forearm  Entyvio (Vedolizumab), Dose: 300 mg  Infusion Start Time: 1007  Infusion Stop Time: 1039  Post Infusion IV Care: Peripheral IV Discontinued  Discharge: Condition: Good, Destination: Home . AVS Declined  Performed by:  Loney Hering, LPN

## 2023-01-28 ENCOUNTER — Other Ambulatory Visit: Payer: Self-pay | Admitting: *Deleted

## 2023-01-28 DIAGNOSIS — D500 Iron deficiency anemia secondary to blood loss (chronic): Secondary | ICD-10-CM

## 2023-01-31 ENCOUNTER — Inpatient Hospital Stay: Payer: 59 | Attending: Hematology and Oncology

## 2023-01-31 DIAGNOSIS — Z79899 Other long term (current) drug therapy: Secondary | ICD-10-CM | POA: Diagnosis not present

## 2023-01-31 DIAGNOSIS — Z8042 Family history of malignant neoplasm of prostate: Secondary | ICD-10-CM | POA: Diagnosis not present

## 2023-01-31 DIAGNOSIS — D500 Iron deficiency anemia secondary to blood loss (chronic): Secondary | ICD-10-CM

## 2023-01-31 DIAGNOSIS — Z833 Family history of diabetes mellitus: Secondary | ICD-10-CM | POA: Insufficient documentation

## 2023-01-31 DIAGNOSIS — Z8261 Family history of arthritis: Secondary | ICD-10-CM | POA: Diagnosis not present

## 2023-01-31 DIAGNOSIS — E781 Pure hyperglyceridemia: Secondary | ICD-10-CM | POA: Insufficient documentation

## 2023-01-31 DIAGNOSIS — K51911 Ulcerative colitis, unspecified with rectal bleeding: Secondary | ICD-10-CM | POA: Diagnosis not present

## 2023-01-31 DIAGNOSIS — Z8249 Family history of ischemic heart disease and other diseases of the circulatory system: Secondary | ICD-10-CM | POA: Diagnosis not present

## 2023-01-31 DIAGNOSIS — Z83438 Family history of other disorder of lipoprotein metabolism and other lipidemia: Secondary | ICD-10-CM | POA: Insufficient documentation

## 2023-01-31 DIAGNOSIS — D509 Iron deficiency anemia, unspecified: Secondary | ICD-10-CM | POA: Diagnosis present

## 2023-01-31 DIAGNOSIS — Z8049 Family history of malignant neoplasm of other genital organs: Secondary | ICD-10-CM | POA: Insufficient documentation

## 2023-01-31 LAB — CBC WITH DIFFERENTIAL (CANCER CENTER ONLY)
Abs Immature Granulocytes: 0.02 10*3/uL (ref 0.00–0.07)
Basophils Absolute: 0 10*3/uL (ref 0.0–0.1)
Basophils Relative: 1 %
Eosinophils Absolute: 0.2 10*3/uL (ref 0.0–0.5)
Eosinophils Relative: 2 %
HCT: 42.4 % (ref 39.0–52.0)
Hemoglobin: 15.1 g/dL (ref 13.0–17.0)
Immature Granulocytes: 0 %
Lymphocytes Relative: 28 %
Lymphs Abs: 2.1 10*3/uL (ref 0.7–4.0)
MCH: 30.6 pg (ref 26.0–34.0)
MCHC: 35.6 g/dL (ref 30.0–36.0)
MCV: 85.8 fL (ref 80.0–100.0)
Monocytes Absolute: 0.4 10*3/uL (ref 0.1–1.0)
Monocytes Relative: 6 %
Neutro Abs: 4.8 10*3/uL (ref 1.7–7.7)
Neutrophils Relative %: 63 %
Platelet Count: 325 10*3/uL (ref 150–400)
RBC: 4.94 MIL/uL (ref 4.22–5.81)
RDW: 12.3 % (ref 11.5–15.5)
WBC Count: 7.5 10*3/uL (ref 4.0–10.5)
nRBC: 0 % (ref 0.0–0.2)

## 2023-01-31 LAB — FERRITIN: Ferritin: 26 ng/mL (ref 24–336)

## 2023-01-31 LAB — IRON AND IRON BINDING CAPACITY (CC-WL,HP ONLY)
Iron: 57 ug/dL (ref 45–182)
Saturation Ratios: 15 % — ABNORMAL LOW (ref 17.9–39.5)
TIBC: 372 ug/dL (ref 250–450)
UIBC: 315 ug/dL (ref 117–376)

## 2023-02-01 ENCOUNTER — Ambulatory Visit: Payer: 59 | Admitting: Gastroenterology

## 2023-02-01 ENCOUNTER — Other Ambulatory Visit (INDEPENDENT_AMBULATORY_CARE_PROVIDER_SITE_OTHER): Payer: 59

## 2023-02-01 ENCOUNTER — Encounter: Payer: Self-pay | Admitting: Gastroenterology

## 2023-02-01 VITALS — BP 112/80 | HR 97 | Ht 63.0 in | Wt 215.5 lb

## 2023-02-01 DIAGNOSIS — K921 Melena: Secondary | ICD-10-CM

## 2023-02-01 DIAGNOSIS — K51919 Ulcerative colitis, unspecified with unspecified complications: Secondary | ICD-10-CM

## 2023-02-01 DIAGNOSIS — D500 Iron deficiency anemia secondary to blood loss (chronic): Secondary | ICD-10-CM | POA: Diagnosis not present

## 2023-02-01 LAB — SEDIMENTATION RATE: Sed Rate: 19 mm/h — ABNORMAL HIGH (ref 0–15)

## 2023-02-01 LAB — C-REACTIVE PROTEIN: CRP: <1 mg/dL (ref 0.5–20.0)

## 2023-02-01 NOTE — Patient Instructions (Signed)
Your provider has requested that you go to the basement level for lab work before leaving today. Press "B" on the elevator. The lab is located at the first door on the left as you exit the elevator.  You will need labs done 03/17/23 ( Vedolizumab and Antibody).  Press "B" on the elevator. The lab is located at the first door on the left as you exit the elevator.  Follow-up in 6 months. Office will contact you to schedule. If you have not heard from our office,please contact us to schedule.   _______________________________________________________  If your blood pressure at your visit was 140/90 or greater, please contact your primary care physician to follow up on this.  _______________________________________________________  If you are age 55 or older, your body mass index should be between 23-30. Your Body mass index is 38.17 kg/m. If this is out of the aforementioned range listed, please consider follow up with your Primary Care Provider.  If you are age 71 or younger, your body mass index should be between 19-25. Your Body mass index is 38.17 kg/m. If this is out of the aformentioned range listed, please consider follow up with your Primary Care Provider.   ________________________________________________________  The [facility] providers would like to encourage you to use [facility] to communicate with providers for non-urgent requests or questions.  Due to long hold times on the telephone, sending your provider a message by [facility] may be a faster and more efficient way to get a response.  Please allow 48 business hours for a response.  Please remember that this is for non-urgent requests.  _______________________________________________________  Due to recent changes in healthcare laws, you may see the results of your imaging and laboratory studies on MyChart before your provider has had a chance to review them.  We understand that in some cases there may be results that are confusing or  concerning to you. Not all laboratory results come back in the same time frame and the provider may be waiting for multiple results in order to interpret others.  Please give [place] 48 hours in order for your provider to thoroughly review all the results before contacting the office for clarification of your results.   Thank you for choosing me and [facility].  Dr.Vito Cirigliano

## 2023-02-01 NOTE — Progress Notes (Signed)
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   - Started having generalized abdominal pain and hematochezia in spring 2023 - 11/02/2021: Normal CBC, TSH, iron panel, FOBT positive, CRP 13.2 - 11/13/2021: ER evaluation. WBC 12.9, H/H 13/37.6, PLT 463, Iron 27, TIBC 253, sat 11%, normal CMP, CRP 3.9 - 11/13/2021: CT A/P: Diffuse colonic wall thickening.  Normal stomach, small bowel.  No lymphadenopathy - 12/09/2021: Initial appointment GI clinic.  WBC 13.5, H/H 9.8/29.4, ferritin 25, iron 11, TIBC 304, sat 3.6%.  Folate 8.2, normal B12.  Negative/normal GI PCR panel.  Calprotectin 1500 - 7/25-29, 2023: Hospital admission with hematochezia, abdominal pain, symptomatic anemia, fever.  Treated with IV Solu-Medrol with improvement, IV iron, 1 unit PRBC transfusion.  QuantiFERON gold indeterminate. HBsAg- - 12/15/2021: CT A/P: Pancolonic inflammation slightly worse compared with prior study - 12/17/2021: EGD: LA Grade A esophagitis, mild gastritis, moderate duodenitis - 12/17/2021: Colonoscopy: Moderate pancolitis.  Normal TI - 01/11/2022: Follow-up in GI clinic.  Was doing better on prednisone and Lialda.  Completed IV iron x3.  VZV Ab+ c/w appropriate immunity.  Started on Zeposia 01/2022 - 03/30/2022: Follow-up in the GI clinic.  Feeling much better since starting supposedly with bowel habits back to baseline but still BRB mixed in stool.  Calprotectin 2290, ESR 44, CRP normal.  Added Uceris - 06/08/2022: Follow-up in GI clinic.  No appreciable improvement with Uceris.  Stopped the Zeposia, started prednisone and Entyvio.  Good response to IV iron.  HPI:     Patient is a 22 y.o. male presenting to the Gastroenterology Clinic for follow-up.  Was last seen by me on 06/08/2022.  Was still symptomatic despite Uceris and Zeposia, so started on prednisone and transitioned to [place], started on 07/01/2022.   Symptoms overall much improved.  Still  has BRB with stools at times, but overall improved.  Stools are otherwise formed and at baseline (3-4 stools/day).  No abdominal pain, rectal pain.  No urgency.  No tenesmus.  Last Entyvio infusion was 5 days ago (has had 5 infusions now).   Reviewed labs from yesterday.  Now normal CBC with H/H 15/42, ferritin 26, iron 57, TIBC 372, sat 15%, overall much improved from previous.  Has follow-up in the Hematology clinic next week.  Saw eye doctor recently and told exam is normal and no ocular manifestation of IBD.  Review of systems:     No chest pain, no SOB, no fevers, no urinary sx   Past Medical History:  Diagnosis Date   Frequent headaches    Left ventricular hypertrophy by electrocardiogram 10/28/2015   echo after was normal.    Palpitations 10/30/2015   pt reports palpitaitons and chest discomfort. EKG (reported LVH) and then NORMAL echo completed.    Ulcerative colitis (HCC)     Patient's surgical history, family medical history, social history, medications and allergies were all reviewed in Epic    Current Outpatient Medications  Medication Sig Dispense Refill   vedolizumab (ENTYVIO) 300 MG injection Inject into the vein.     Vitamin D, Ergocalciferol, (DRISDOL) 1.25 MG (50000 UNIT) CAPS capsule Take 1 capsule (50,000 Units total) by mouth every 7 (seven) days. 12 capsule 0   No current facility-administered medications for this visit.    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  Partial response to Lialda, primary nonresponder to Zeposia, minimal response to Uceris.  Has had clinical  improvement with Entyvio.  - Check fecal calprotectin, ESR, CRP today - Check vedolizumab trough and Ab 1 week prior to next infusion (next dose scheduled for 03/24/2023) - Discussed colonoscopy to evaluate histologic/endoscopic response to therapy.  If elevated inflammatory markers, will schedule now.  If inflammatory markers downtrending or normalizing, plan to do in another 3 months or so which would be about 9 months after starting Entyvio - Flu vaccine - Schedule annual skin check with his Dermatologist - Continue Entyvio as scheduled  3) Iron deficiency anemia Secondary to UC.  Good response to IV iron - Continue follow-up in the Hematology clinic  4) Migraines - Recommend that he follow-up with Dr. Claiborne Billings for evaluation/management of migraines. - Avoiding NSAIDs - Should otherwise be ok to use typical migraine medications   RTC in 6 months or sooner prn     I spent 35 minutes of time, including in depth chart review, independent review of results as outlined above, communicating results with the patient directly, face-to-face time with the patient, coordinating care, and ordering studies and medications as appropriate, and documentation.      Verlin Dike Raylan Troiani ,DO, FACG 02/01/2023, 3:08 PM

## 2023-02-03 ENCOUNTER — Other Ambulatory Visit: Payer: Self-pay

## 2023-02-03 DIAGNOSIS — K51919 Ulcerative colitis, unspecified with unspecified complications: Secondary | ICD-10-CM

## 2023-02-04 ENCOUNTER — Other Ambulatory Visit: Payer: 59

## 2023-02-07 ENCOUNTER — Inpatient Hospital Stay (HOSPITAL_BASED_OUTPATIENT_CLINIC_OR_DEPARTMENT_OTHER): Payer: 59 | Admitting: Adult Health

## 2023-02-07 DIAGNOSIS — D500 Iron deficiency anemia secondary to blood loss (chronic): Secondary | ICD-10-CM

## 2023-02-07 NOTE — Progress Notes (Signed)
[facility] Cancer Follow up:    [name], DO [address] [place] [address]   DIAGNOSIS: Iron deficiency anemia  SUMMARY OF HEMATOLOGIC HISTORY: History of ulcerative colitis diagnosed in 11/2021 02/10/2022 diagnosed with Iron deficiency anemia with ferritin of 9, saturation 4.8% Receives IV iron intermittently; 12/2021, 03/2022, 07/2022  CURRENT THERAPY:Intermittent IV iron  INTERVAL HISTORY: Willie James 22 y.o. male returns for follow-up of his iron deficiency anemia.  He underwent follow-up with Dr. Barron Alvine on February 01, 2023 for his ulcerative colitis and hematochezia.  He was scheduled for repeat colonoscopy to evaluate histologic/endoscopic response to his ulcerative colitis therapy.  He underwent lab testing on January 31, 2023 that demonstrated hemoglobin of 15.1, hematocrit of 42.4, iron of 57, saturation of 15%, and ferritin of 26.    His labs are stable from June, and he has an unchanged amt of hematochezia.  Is a week at [employer] and is going to school part-time at [employer] studying computer science.  With these activities he is not getting increasingly fatigued and is able to manage this with his ulcerative colitis and iron deficiency.   Patient Active Problem List   Diagnosis Date Noted   Vitamin D deficiency 11/30/2022   Acute esophagitis    Ulcerative colitis (HCC) 12/15/2021   ABLA (acute blood loss anemia) 12/15/2021   SIRS (systemic inflammatory response syndrome) (HCC) 12/15/2021   IDA (iron deficiency anemia) 12/10/2021   Hypertriglyceridemia 01/07/2020   Obesity (BMI 30-39.9) 01/03/2018   Palpitations 01/03/2018    has No Known Allergies.  MEDICAL HISTORY: Past Medical History:  Diagnosis Date   Frequent headaches    Left ventricular hypertrophy by electrocardiogram 10/28/2015   echo after was normal.    Palpitations 10/30/2015   pt reports palpitaitons and chest discomfort. EKG (reported LVH) and then NORMAL echo  completed.    Ulcerative colitis (HCC)     SURGICAL HISTORY: Past Surgical History:  Procedure Laterality Date   BIOPSY  12/17/2021   Procedure: BIOPSY;  Surgeon: Beverley Fiedler, MD;  Location: [facility];  Service: Gastroenterology;;  EGD and COLON   COLONOSCOPY N/A 12/17/2021   Procedure: COLONOSCOPY;  Surgeon: Beverley Fiedler, MD;  Location: [facility];  Service: Gastroenterology;  Laterality: N/A;   ESOPHAGOGASTRODUODENOSCOPY (EGD) WITH PROPOFOL N/A 12/17/2021   Procedure: ESOPHAGOGASTRODUODENOSCOPY (EGD) WITH PROPOFOL;  Surgeon: Beverley Fiedler, MD;  Location: [facility];  Service: Gastroenterology;  Laterality: N/A;   NO PAST SURGERIES      SOCIAL HISTORY: Social History   Socioeconomic History   Marital status: Single    Spouse name: Not on file   Number of children: Not on file   Years of education: Not on file   Highest education level: Some college, no degree  Occupational History   Not on file  Tobacco Use   Smoking status: Never   Smokeless tobacco: Never  Vaping Use   Vaping status: Never Used  Substance and Sexual Activity   Alcohol use: Never   Drug use: Never   Sexual activity: Never  Other Topics Concern   Not on file  Social History Narrative   Marital status/children/pets: Single.    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[employer])   Safety:      -Wears a bicycle helmet riding a bike: Yes     -smoke alarm in the home:Yes     - wears seatbelt: Yes     - Feels safe in their relationships: Yes   Social Determinants of  Health   Financial Resource Strain: Low Risk  (11/02/2021)   Overall Financial Resource Strain (CARDIA)    Difficulty of Paying Living Expenses: Not hard at all  Food Insecurity: No Food Insecurity (11/02/2021)   Hunger Vital Sign    Worried About Running Out of Food in the Last Year: Never true    Ran Out of Food in the Last Year: Never true  Transportation Needs: No Transportation Needs (11/02/2021)   PRAPARE - Scientist, research (physical sciences) (Medical): No    Lack of Transportation (Non-Medical): No  Physical Activity: Unknown (11/02/2021)   Exercise Vital Sign    Days of Exercise per Week: 0 days    Minutes of Exercise per Session: Not on file  Stress: No Stress Concern Present (11/02/2021)   [employer] of Occupational Health - Occupational Stress Questionnaire    Feeling of Stress : Not at all  Social Connections: Socially Isolated (11/02/2021)   Social Connection and Isolation Panel [NHANES]    Frequency of Communication with Friends and Family: Never    Frequency of Social Gatherings with Friends and Family: More than three times a week    Attends Religious Services: Never    Database administrator or Organizations: No    Attends Engineer, structural: Not on file    Marital Status: Never married  Catering manager Violence: Not on file    FAMILY HISTORY: Family History  Problem Relation Age of Onset   Hyperlipidemia Father    Arthritis Maternal Grandmother    Diabetes Maternal Grandmother    Hyperlipidemia Maternal Grandmother    Hypertension Maternal Grandmother    Hypertension Maternal Grandfather    Hyperlipidemia Maternal Grandfather    Prostate cancer Maternal Grandfather    Cervical cancer Paternal Grandmother    Hypertension Paternal Grandfather    Heart disease Paternal Grandfather    Colon cancer Neg Hx    Esophageal cancer Neg Hx     Review of Systems  Constitutional:  Negative for appetite change, chills, fatigue, fever and unexpected weight change.  HENT:   Negative for hearing loss, lump/mass and trouble swallowing.   Eyes:  Negative for eye problems and icterus.  Respiratory:  Negative for chest tightness, cough and shortness of breath.   Cardiovascular:  Negative for chest pain, leg swelling and palpitations.  Gastrointestinal:  Positive for blood in stool. Negative for abdominal distention, abdominal pain, constipation, diarrhea, nausea and vomiting.  Endocrine:  Negative for hot flashes.  Genitourinary:  Negative for difficulty urinating.   Musculoskeletal:  Negative for arthralgias.  Skin:  Negative for itching and rash.  Neurological:  Negative for dizziness, extremity weakness, headaches and numbness.  Hematological:  Negative for adenopathy. Does not bruise/bleed easily.  Psychiatric/Behavioral:  Negative for depression. The patient is not nervous/anxious.       PHYSICAL EXAMINATION Patient sounds well.  He is in no apparent distress, mood and behavior are normal.    LABORATORY DATA: Reviewed from 01/31/2023   ASSESSMENT and THERAPY PLAN:   IDA (iron deficiency anemia) Willie James is a 22 year old male with iron deficiency anemia related to his history of ulcerative colitis.  His iron levels and hemoglobin are stable.  He will continue to follow-up with Dr. Barron Alvine and GI and has a colonoscopy set to be scheduled.  We will see him back in 12 weeks for labs and follow-up so we can ensure stability in his iron levels.  He knows to call for any questions or concerns  prior to his next appointment with [place].  Follow up instructions:    -Return to cancer center in 12 weeks for labs and f/u  The patient was provided an opportunity to ask questions and all were answered. The patient agreed with the plan and demonstrated an understanding of the instructions.   The patient was advised to call back or seek an in-person evaluation if the symptoms worsen or if the condition fails to improve as anticipated.   I provided 7 minutes of non face-to-face telephone visit time during this encounter, and > 50% was spent counseling as documented under my assessment & plan.   Lillard Anes, NP 02/07/23 3:02 PM Medical Oncology and Hematology [facility] [address] [place], [place] [address] Tel. [phone]    Fax. [phone]

## 2023-02-07 NOTE — Assessment & Plan Note (Signed)
Willie James is a 22 year old male with iron deficiency anemia related to his history of ulcerative colitis.  His iron levels and hemoglobin are stable.  He will continue to follow-up with Dr. Barron Alvine and GI and has a colonoscopy set to be scheduled.  We will see him back in 12 weeks for labs and follow-up so we can ensure stability in his iron levels.  He knows to call for any questions or concerns prior to his next appointment with [place].

## 2023-02-10 ENCOUNTER — Telehealth: Payer: Self-pay | Admitting: Adult Health

## 2023-02-10 LAB — CALPROTECTIN, FECAL: Calprotectin, Fecal: 272 ug/g — ABNORMAL HIGH (ref 0–120)

## 2023-02-10 NOTE — Telephone Encounter (Signed)
Patient is aware of scheduled appointment times/dates

## 2023-02-15 ENCOUNTER — Encounter: Payer: Self-pay | Admitting: *Deleted

## 2023-02-15 ENCOUNTER — Other Ambulatory Visit: Payer: Self-pay | Admitting: *Deleted

## 2023-02-15 DIAGNOSIS — K51919 Ulcerative colitis, unspecified with unspecified complications: Secondary | ICD-10-CM

## 2023-02-15 MED ORDER — CLENPIQ 10-3.5-12 MG-GM -GM/160ML PO SOLN: 1.0000 | ORAL | 0 refills | Status: DC

## 2023-02-16 ENCOUNTER — Ambulatory Visit: Payer: 59 | Admitting: Family Medicine

## 2023-02-16 ENCOUNTER — Encounter: Payer: Self-pay | Admitting: Family Medicine

## 2023-02-16 VITALS — BP 122/79 | HR 79 | Temp 98.1°F | Wt 213.4 lb

## 2023-02-16 DIAGNOSIS — R519 Headache, unspecified: Secondary | ICD-10-CM | POA: Diagnosis not present

## 2023-02-16 DIAGNOSIS — G43009 Migraine without aura, not intractable, without status migrainosus: Secondary | ICD-10-CM

## 2023-02-16 DIAGNOSIS — L819 Disorder of pigmentation, unspecified: Secondary | ICD-10-CM | POA: Diagnosis not present

## 2023-02-16 DIAGNOSIS — E559 Vitamin D deficiency, unspecified: Secondary | ICD-10-CM | POA: Diagnosis not present

## 2023-02-16 LAB — MAGNESIUM: Magnesium: 2.1 mg/dL (ref 1.5–2.5)

## 2023-02-16 LAB — B12 AND FOLATE PANEL
Folate: 12.6 ng/mL (ref >5.9)
Vitamin B-12: 301 pg/mL (ref 211–911)

## 2023-02-16 LAB — VITAMIN D 25 HYDROXY (VIT D DEFICIENCY, FRACTURES): VITD: 17.8 ng/mL — ABNORMAL LOW (ref 30.00–100.00)

## 2023-02-16 MED ORDER — HYDROXYZINE HCL 10 MG PO TABS: 10.0000 mg | ORAL_TABLET | Freq: Every day | ORAL | 0 refills | Status: DC

## 2023-02-16 MED ORDER — RIZATRIPTAN BENZOATE 5 MG PO TABS
5.0000 mg | ORAL_TABLET | ORAL | 5 refills | Status: DC | PRN
Start: 2023-02-16 — End: 2023-08-24

## 2023-02-16 NOTE — Progress Notes (Signed)
Willie James , 11/17/2000, 22 y.o., male MRN: [id number] Patient Care Team    Relationship Specialty Notifications Start End  [name], DO PCP - General Family Medicine  11/13/21   Janalyn Harder, MD (Inactive) Consulting Physician Dermatology  09/24/20     Chief Complaint  Patient presents with   Headache    daily     Subjective: Willie James is a 22 y.o. Pt presents for an OV with complaints of increase frequency in headache. He states he started to have frequent headaches in high school. He has never had a work up for his headaches.  He reports he used to take ibuprofen frequently for his headaches, and he can no longer take ibuprofen secondary to his ulcerative colitis.  He endorses having a headache almost every day when he wakes up that can be anywhere on his head.  Most frequently top of his forehead area.   He states he also has migraines about every other month.  When he has a migraine he has to lay down usually for the rest of the day.  He endorses nausea and photophobia with his migraines.  He has never tried migraine medicine in the past. Patient reports he does snore.  He has woken himself up snoring.  He is uncertain if he has had any apneic spells.Body mass index is 37.8 kg/m.  History of ulcerative colitis, with low iron in November 2023 improving iron saturations and CBC as of 10/2022.  TSH within normal limits 11/29/2022     02/16/2023   10:59 AM 11/29/2022    8:20 AM 11/29/2022    8:09 AM 10/20/2021    1:58 PM 04/04/2020    2:55 PM  Depression screen PHQ 2/9  Decreased Interest 0 1 0 0 0  Down, Depressed, Hopeless 0 1 0 0 0  PHQ - 2 Score 0 2 0 0 0  Altered sleeping  3     Tired, decreased energy  3     Change in appetite  2     Feeling bad or failure about yourself   0     Trouble concentrating  0     Moving slowly or fidgety/restless  0     Suicidal thoughts  0     PHQ-9 Score  10       No Known Allergies Social History   Social History  Narrative   Marital status/children/pets: Single.    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    Palpitations 10/30/2015   pt reports palpitaitons and chest discomfort. EKG (reported LVH) and then NORMAL echo completed.    SIRS (systemic inflammatory response syndrome) (HCC) 12/15/2021   Ulcerative colitis [facility])    Past Surgical History:  Procedure Laterality Date   BIOPSY  12/17/2021   Procedure: BIOPSY;  Surgeon: Beverley Fiedler, MD;  Location: [facility];  Service: Gastroenterology;;  EGD and COLON   COLONOSCOPY N/A 12/17/2021   Procedure: COLONOSCOPY;  Surgeon: Beverley Fiedler, MD;  Location: [facility];  Service: Gastroenterology;  Laterality: N/A;   ESOPHAGOGASTRODUODENOSCOPY (EGD) WITH PROPOFOL  N/A 12/17/2021   Procedure: ESOPHAGOGASTRODUODENOSCOPY (EGD) WITH PROPOFOL;  Surgeon: Beverley Fiedler, MD;  Location: [facility];  Service: Gastroenterology;  Laterality: N/A;   NO PAST SURGERIES     Family History  Problem Relation Age of Onset   Hyperlipidemia Father    Arthritis Maternal Grandmother    Diabetes Maternal Grandmother    Hyperlipidemia Maternal Grandmother    Hypertension Maternal Grandmother    Hypertension Maternal Grandfather    Hyperlipidemia Maternal Grandfather    Prostate cancer Maternal Grandfather    Cervical cancer Paternal Grandmother    Hypertension Paternal Grandfather    Heart disease Paternal Grandfather    Colon cancer Neg Hx    Esophageal cancer Neg Hx    Allergies as of 02/16/2023   No Known Allergies      Medication List        Accurate as of February 16, 2023 12:28 PM. If you have any questions, ask your nurse or doctor.           STOP taking these medications    Clenpiq 10-3.5-12 MG-GM -GM/160ML Soln Generic drug: Sod Picosulfate-Mag Ox-Cit Acd Stopped by: Felix Pacini   vedolizumab 300 MG injection Commonly known as: ENTYVIO Stopped by: Felix Pacini       TAKE these medications    hydrOXYzine 10 MG tablet Commonly known as: ATARAX Take 1 tablet (10 mg total) by mouth at bedtime. Started by: Felix Pacini   rizatriptan 5 MG tablet Commonly known as: MAXALT Take 1 tablet (5 mg total) by mouth as needed for migraine. May repeat in 2 hours if needed Started by: Felix Pacini   Vitamin D (Ergocalciferol) 1.25 MG (50000 UNIT) Caps capsule Commonly known as: DRISDOL Take 1 capsule (50,000 Units total) by mouth every 7 (seven) days.        All past medical history, surgical history, allergies, family history, immunizations andmedications were updated in the EMR today and reviewed under the history and medication portions of their EMR.     ROS Negative, with the exception of above mentioned in HPI   Objective:  BP 122/79   Pulse 79   Temp 98.1 F (36.7 C)   Wt 213 lb 6.4 oz (96.8 kg)   SpO2 98%   BMI 37.80 kg/m  Body mass index is 37.8 kg/m. Physical Exam Vitals and nursing note reviewed.  Constitutional:      General: He is not in acute distress.    Appearance: Normal appearance. He is not ill-appearing, toxic-appearing or diaphoretic.  HENT:     Head: Normocephalic and atraumatic.     Nose: Septal deviation and congestion present.     Right Turbinates: Enlarged.     Left Turbinates: Enlarged.     Right Sinus: No frontal sinus tenderness.     Left Sinus: No frontal sinus tenderness.     Mouth/Throat:     Mouth: Mucous membranes are moist.     Pharynx: Uvula midline.     Tonsils: No tonsillar exudate or tonsillar abscesses.     Comments: Small posterior pharynx Eyes:     General: No visual field deficit or scleral icterus.       Right eye: No discharge.        Left eye:  No discharge.     Extraocular Movements: Extraocular movements intact.     Pupils: Pupils are equal, round, and reactive to light.  Musculoskeletal:        General: No swelling or tenderness. Normal range of  motion.     Cervical back: Normal range of motion and neck supple. No rigidity.  Skin:    General: Skin is warm and dry.     Coloration: Skin is not jaundiced or pale.     Findings: No rash.  Neurological:     Mental Status: He is alert and oriented to person, place, and time. Mental status is at baseline.     Cranial Nerves: No cranial nerve deficit.     Motor: No weakness.     Coordination: Coordination normal.     Gait: Gait normal.  Psychiatric:        Mood and Affect: Mood normal.        Behavior: Behavior normal.        Thought Content: Thought content normal.        Judgment: Judgment normal.      No results found. No results found. No results found for this or any previous visit (from the past 24 hour(s)).  Assessment/Plan: Willie James is a 22 y.o. male present for OV for   Increased frequency of headaches We discussed the large differential of his reported daily headaches.  He seems to wake up with these headaches frequently.  We discussed sinus causes, need of image of brain and neurology referrals, OSA eval etc.   To start we will start with lab work and trial of Vistaril nightly to help her out tension and muscle skeletal causes - Vitamin D (25 hydroxy) - B12 and Folate Panel - Magnesium - PTH, Intact and Calcium He will follow-up in 4 weeks and if needed MRI brain and/or neurology referral would be placed for him.  Migraine without aura and without status migrainosus, not intractable Patient has a migraine approximately every other month differing from his reported every day headache above. B12, folate and magnesium levels collected today. May benefit from supplementing with B complex and magnesium.  Patient will be guided on supplements if appropriate  after results received. Maxalt 5 mg at onset of headache prescribed, may repeat once in 2 hours if headache remains  Vitamin D deficiency - Vitamin D (25 hydroxy) - PTH, Intact and Calcium  Hypopigmented skin lesion - Ambulatory referral to Dermatology    Reviewed expectations re: course of current medical issues. Discussed self-management of symptoms. Outlined signs and symptoms indicating need for more acute intervention. Patient verbalized understanding and all questions were answered. Patient received an After-Visit Summary.    Orders Placed This Encounter  Procedures   Vitamin D (25 hydroxy)   B12 and Folate Panel   Magnesium   PTH, Intact and Calcium   Ambulatory referral to Dermatology   Meds ordered this encounter  Medications   rizatriptan (MAXALT) 5 MG tablet    Sig: Take 1 tablet (5 mg total) by mouth as needed for migraine. May repeat in 2 hours if needed    Dispense:  10 tablet    Refill:  5   hydrOXYzine (ATARAX) 10 MG tablet    Sig: Take 1 tablet (10 mg total) by mouth at bedtime.    Dispense:  30 tablet    Refill:  0   Referral Orders         Ambulatory referral to Dermatology       Note is dictated utilizing voice recognition software. Although note has been proof read prior to signing, occasional typographical errors still can be missed. If any questions arise, please do not hesitate to call for verification.   electronically signed  by:  Felix Pacini, DO  [facility] [place]

## 2023-02-16 NOTE — Patient Instructions (Addendum)
Return in about 4 weeks (around 03/16/2023) for headaches.        Great to see you today.  I have refilled the medication(s) we provide.   If labs were collected or images ordered, we will inform you of  results once we have received them and reviewed. We will contact you either by echart message, or telephone call.  Please give ample time to the testing facility, and our office to run,  receive and review results. Please do not call inquiring of results, even if you can see them in your chart. We will contact you as soon as we are able. If it has been over 1 week since the test was completed, and you have not yet heard from [place], then please call us.    - echart message- for normal results that have been seen by the patient already.   - telephone call: abnormal results or if patient has not viewed results in their echart.  If a referral to a specialist was entered for you, please call us in 2 weeks if you have not heard from the specialist office to schedule.

## 2023-02-17 ENCOUNTER — Telehealth: Payer: Self-pay | Admitting: Family Medicine

## 2023-02-17 LAB — PTH, INTACT AND CALCIUM
Calcium: 9.8 mg/dL (ref 8.6–10.3)
PTH: 45 pg/mL (ref 16–77)

## 2023-02-17 MED ORDER — B-12 1000 MCG SL SUBL: 1000.0000 ug | SUBLINGUAL_TABLET | Freq: Every day | SUBLINGUAL | 3 refills | Status: DC

## 2023-02-17 MED ORDER — VITAMIN D (ERGOCALCIFEROL) 1.25 MG (50000 UNIT) PO CAPS: ORAL_CAPSULE | ORAL | 0 refills | Status: DC

## 2023-02-17 NOTE — Telephone Encounter (Signed)
Spoke with patient regarding results/recommendations.

## 2023-02-17 NOTE — Telephone Encounter (Signed)
Please call patient: His vitamin D is improving from 7.8, now 17.8.  This is still extremely low.  I have increased his vitamin D to twice weekly dosing.  He should try to spread out this dose by a couple days such as taking on a Monday and Thursday etc.  He has B12 is significantly low at 301.  I did call in a B12 sublingual solution, however if insurance does not cover this he can purchase over-the-counter.  He just needs to make sure he is purchasing a the sublingual format so that he can absorb it underneath the tongue.  This can help decrease headaches when B12 levels are kept in normal range.  His magnesium and folate levels are normal.   Follow-up in 4 weeks on his vitamin deficiencies and headaches.

## 2023-02-22 ENCOUNTER — Ambulatory Visit: Payer: 59 | Admitting: Family Medicine

## 2023-03-09 ENCOUNTER — Other Ambulatory Visit: Payer: Self-pay | Admitting: Family Medicine

## 2023-03-10 ENCOUNTER — Other Ambulatory Visit: Payer: Self-pay | Admitting: Family Medicine

## 2023-03-17 ENCOUNTER — Ambulatory Visit: Payer: 59 | Admitting: Family Medicine

## 2023-03-17 VITALS — BP 112/84 | HR 96 | Temp 98.3°F | Wt 213.6 lb

## 2023-03-17 DIAGNOSIS — E538 Deficiency of other specified B group vitamins: Secondary | ICD-10-CM | POA: Diagnosis not present

## 2023-03-17 DIAGNOSIS — R519 Headache, unspecified: Secondary | ICD-10-CM | POA: Diagnosis not present

## 2023-03-17 DIAGNOSIS — E559 Vitamin D deficiency, unspecified: Secondary | ICD-10-CM | POA: Diagnosis not present

## 2023-03-17 LAB — VITAMIN B12: Vitamin B-12: 367 pg/mL (ref 211–911)

## 2023-03-17 LAB — VITAMIN D 25 HYDROXY (VIT D DEFICIENCY, FRACTURES): VITD: 16.52 ng/mL — ABNORMAL LOW (ref 30.00–100.00)

## 2023-03-17 NOTE — Patient Instructions (Addendum)

## 2023-03-17 NOTE — Progress Notes (Signed)
Willie James , 06/28/2000, 22 y.o., male MRN: [id number] Patient Care Team    Relationship Specialty Notifications Start End  [name], DO PCP - General Family Medicine  11/13/21   Janalyn Harder, MD (Inactive) Consulting Physician Dermatology  09/24/20     Chief Complaint  Patient presents with   vitamin deficiency     Subjective: Willie James is a 22 y.o. Pt presents for an OV follow-up on increased frequency of headaches and vitamin deficiencies.  Patient reports he did notice some increase in energy levels after starting the B12 and vitamin D.  He states his regular headaches are still present, but not as frequent.  He has had 3 migraines since last being seen and did try the Maxalt and states it did work for him.  He has received iron infusions through [facility]. He reports he was unable to tolerate the hydroxyzine 10 mg nightly.  He tried it once and "felt weird "and reported he could not feel his heartbeat.  Of note, the pharmacist told him to be careful with that medication when he picked it up and he wonders if some of it could have been anxiousness surrounding the medicine.  Prior note: with complaints of increase frequency in headache. He states he started to have frequent headaches in high school. He has never had a work up for his headaches.  He reports he used to take ibuprofen frequently for his headaches, and he can no longer take ibuprofen secondary to his ulcerative colitis.  He endorses having a headache almost every day when he wakes up that can be anywhere on his head.  Most frequently top of his forehead area.   He states he also has migraines about every other month.  When he has a migraine he has to lay down usually for the rest of the day.  He endorses nausea and photophobia with his migraines.  He has never tried migraine medicine in the past. Patient reports he does snore.  He has woken himself up snoring.  He is uncertain if he has had any apneic  spells.  History of ulcerative colitis, with low iron in November 2023 improving iron saturations and CBC as of 10/2022.  TSH within normal limits 11/29/2022     02/16/2023   10:59 AM 11/29/2022    8:20 AM 11/29/2022    8:09 AM 10/20/2021    1:58 PM 04/04/2020    2:55 PM  Depression screen PHQ 2/9  Decreased Interest 0 1 0 0 0  Down, Depressed, Hopeless 0 1 0 0 0  PHQ - 2 Score 0 2 0 0 0  Altered sleeping  3     Tired, decreased energy  3     Change in appetite  2     Feeling bad or failure about yourself   0     Trouble concentrating  0     Moving slowly or fidgety/restless  0     Suicidal thoughts  0     PHQ-9 Score  10       No Known Allergies Social History   Social History Narrative   Marital status/children/pets: Single.    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    Palpitations 10/30/2015   pt reports palpitaitons and chest discomfort. EKG (reported LVH) and then NORMAL echo completed.    SIRS (systemic inflammatory response syndrome) (HCC) 12/15/2021   Ulcerative colitis [facility])    Past Surgical History:  Procedure Laterality Date   BIOPSY  12/17/2021   Procedure: BIOPSY;  Surgeon: Beverley Fiedler, MD;  Location: [facility];  Service: Gastroenterology;;  EGD and COLON   COLONOSCOPY N/A 12/17/2021   Procedure: COLONOSCOPY;  Surgeon: Beverley Fiedler, MD;  Location: [facility];  Service: Gastroenterology;  Laterality: N/A;   ESOPHAGOGASTRODUODENOSCOPY (EGD) WITH PROPOFOL N/A 12/17/2021   Procedure: ESOPHAGOGASTRODUODENOSCOPY (EGD) WITH PROPOFOL;  Surgeon: Beverley Fiedler, MD;  Location: [facility];  Service: Gastroenterology;  Laterality: N/A;   NO PAST  SURGERIES     Family History  Problem Relation Age of Onset   Hyperlipidemia Father    Arthritis Maternal Grandmother    Diabetes Maternal Grandmother    Hyperlipidemia Maternal Grandmother    Hypertension Maternal Grandmother    Hypertension Maternal Grandfather    Hyperlipidemia Maternal Grandfather    Prostate cancer Maternal Grandfather    Cervical cancer Paternal Grandmother    Hypertension Paternal Grandfather    Heart disease Paternal Grandfather    Colon cancer Neg Hx    Esophageal cancer Neg Hx    Allergies as of 03/17/2023   No Known Allergies      Medication List        Accurate as of March 17, 2023  9:54 AM. If you have any questions, ask your nurse or doctor.          B-12 1000 MCG Subl Place 1,000 mcg under the tongue daily.   Clenpiq 10-3.5-12 MG-GM -GM/175ML Soln Generic drug: Sod Picosulfate-Mag Ox-Cit Acd See admin instructions.   hydrOXYzine 10 MG tablet Commonly known as: ATARAX Take 1 tablet (10 mg total) by mouth at bedtime.   rizatriptan 5 MG tablet Commonly known as: MAXALT Take 1 tablet (5 mg total) by mouth as needed for migraine. May repeat in 2 hours if needed   Vitamin D (Ergocalciferol) 1.25 MG (50000 UNIT) Caps capsule Commonly known as: DRISDOL 1 capsule PO with food, twice a week        All past medical history, surgical history, allergies, family history, immunizations andmedications were updated in the EMR today and reviewed under the history and medication portions of their EMR.     ROS Negative, with the exception of above mentioned in HPI   Objective:  BP 112/84   Pulse 96   Temp 98.3 F (36.8 C)   Wt 213 lb 9.6 oz (96.9 kg)   SpO2 98%   BMI 37.84 kg/m  Body mass index is 37.84 kg/m. Physical Exam Vitals and nursing note reviewed.  Constitutional:      General: He is not in acute distress.    Appearance: Normal appearance. He is not ill-appearing, toxic-appearing or diaphoretic.  HENT:     Head:  Normocephalic and atraumatic.     Nose: Septal deviation present. No congestion.     Right Turbinates: Enlarged.     Left Turbinates: Enlarged.     Right Sinus: No frontal sinus tenderness.     Left Sinus: No frontal sinus tenderness.     Mouth/Throat:     Mouth: Mucous membranes are moist.     Pharynx:  Uvula midline.     Tonsils: No tonsillar exudate or tonsillar abscesses.     Comments: Small posterior pharynx Eyes:     General: No visual field deficit or scleral icterus.       Right eye: No discharge.        Left eye: No discharge.     Extraocular Movements: Extraocular movements intact.     Pupils: Pupils are equal, round, and reactive to light.  Cardiovascular:     Rate and Rhythm: Normal rate and regular rhythm.     Heart sounds: No murmur heard. Pulmonary:     Effort: Pulmonary effort is normal. No respiratory distress.     Breath sounds: No wheezing, rhonchi or rales.  Skin:    General: Skin is warm.     Coloration: Skin is not jaundiced or pale.     Findings: No rash.  Neurological:     Mental Status: He is alert and oriented to person, place, and time. Mental status is at baseline.     Cranial Nerves: No cranial nerve deficit.     Motor: No weakness.     Coordination: Coordination normal.     Gait: Gait normal.  Psychiatric:        Mood and Affect: Mood normal.        Behavior: Behavior normal.        Thought Content: Thought content normal.        Judgment: Judgment normal.      No results found. No results found. No results found for this or any previous visit (from the past 24 hour(s)).  Assessment/Plan: Caison Vernier is a 22 y.o. male present for OV for  Increased frequency of headaches We discussed the large differential of his reported daily headaches.  He seems to wake up with these headaches frequently.  We discussed sinus causes, need of image of brain and neurology referrals, OSA eval etc.  > he would like to wait on any referrals or imaging for  now. We discussed the low-dose Vistaril prescribed did not likely cause him the side effects he reported although cannot be 100% certain.  Reassured him this is a very low-dose and is in the antihistamine class.  Leave the Vistaril on his med list for now, he may want to retry, could start at half a tab before bed to see if helpful. - Vitamin D (25 hydroxy)-remain deficient - B12 and Folate Panel-B12 deficient, folate normal - Magnesium-within normal limits - PTH, Intact and Calcium-within normal limits   Migraine without aura and without status migrainosus, not intractable Stable Folate and magnesium levels were normal.  B12 was low.  Patient was encouraged to supplement. Continue Maxalt 5 mg at onset of headache prescribed, may repeat once in 2 hours if headache remains.  Worked well for him  Vitamin D deficiency/B12 deficiency Patient is supplementing vitamin D and B12 Vitamin D collected today B12 collected today Patient will be guided on appropriate doses after results received   Reviewed expectations re: course of current medical issues. Discussed self-management of symptoms. Outlined signs and symptoms indicating need for more acute intervention. Patient verbalized understanding and all questions were answered. Patient received an After-Visit Summary.    Orders Placed This Encounter  Procedures   Vitamin B12   VITAMIN D 25 Hydroxy (Vit-D Deficiency, Fractures)   No orders of the defined types were placed in this encounter.  Referral Orders  No referral(s) requested today      Note is dictated  utilizing voice recognition software. Although note has been proof read prior to signing, occasional typographical errors still can be missed. If any questions arise, please do not hesitate to call for verification.   electronically signed by:  Felix Pacini, DO  [facility] [place]

## 2023-03-18 ENCOUNTER — Telehealth: Payer: Self-pay | Admitting: Family Medicine

## 2023-03-18 MED ORDER — VITAMIN D3 125 MCG (5000 UT) PO CAPS: 5000.0000 [IU] | ORAL_CAPSULE | Freq: Every day | ORAL | 11 refills | Status: DC

## 2023-03-18 NOTE — Telephone Encounter (Signed)
Please call pt His vit d is not increasing. I have called in a different form of supplement that can be hopefully be better absorbed. This supplement is DAILY. He has refills on it to continue long term B12 only raised a small fraction. If he reports he is using the under the tongue version daily, then I recommend he increase the b12 dose to 2000 mcg subl daily.  Follow up as needed

## 2023-03-18 NOTE — Telephone Encounter (Signed)
LM for pt to return call to discuss.  

## 2023-03-19 ENCOUNTER — Encounter: Payer: Self-pay | Admitting: Certified Registered Nurse Anesthetist

## 2023-03-21 ENCOUNTER — Ambulatory Visit (AMBULATORY_SURGERY_CENTER): Payer: 59 | Admitting: Gastroenterology

## 2023-03-21 ENCOUNTER — Encounter: Payer: Self-pay | Admitting: Gastroenterology

## 2023-03-21 ENCOUNTER — Telehealth: Payer: Self-pay

## 2023-03-21 VITALS — BP 132/82 | HR 92 | Temp 98.0°F | Resp 25 | Ht 63.0 in | Wt 215.0 lb

## 2023-03-21 DIAGNOSIS — K51919 Ulcerative colitis, unspecified with unspecified complications: Secondary | ICD-10-CM

## 2023-03-21 MED ORDER — SODIUM CHLORIDE 0.9 % IV SOLN: 500.0000 mL | INTRAVENOUS | Status: DC

## 2023-03-21 NOTE — Telephone Encounter (Signed)
Called pt per request of Dr. Barron Alvine and CRNA Charlsie Merles to see if pt could come in early today for procedure since there were some cancellations on the schedule.  Pt agreed to arrive at 2:00 for a 2:30 pm procedure time.  Pt stated that he already finished prep.  Pt was given instructions to stop drinking clear liquids and NPO at 12:30 pm which will meet the minimum cut off time for anesthesia.  Pt voiced understanding.  MD and CRNA made aware.

## 2023-03-21 NOTE — Op Note (Signed)
[facility] Patient Name: Willie James Procedure Date: 03/21/2023 2:48 PM MRN: [id number] Endoscopist: Doristine Locks , MD, [id number] Age: 22 Referring MD:  Date of Birth: Jan 16, 2001 Gender: Male Account #: [id number] Procedure:                Colonoscopy w/ biopsy Indications:              Follow-up of chronic ulcerative pancolitis, Assess                            therapeutic response to therapy of chronic                            ulcerative pancolitis                           Currently on Entyvio monotherapy with good clinical                            response. Recent fecal calprotectin 272, which is                            much improved after starting Entyvio. Normal ESR                            and CRP. Medicines:                Monitored Anesthesia Care Procedure:                Pre-Anesthesia Assessment:                           - Prior to the procedure, a History and Physical                            was performed, and patient medications and                            allergies were reviewed. The patient's tolerance of                            previous anesthesia was also reviewed. The risks                            and benefits of the procedure and the sedation                            options and risks were discussed with the patient.                            All questions were answered, and informed consent                            was obtained. Prior Anticoagulants: The patient has  taken no anticoagulant or antiplatelet agents. ASA                            Grade Assessment: II - A patient with mild systemic                            disease. After reviewing the risks and benefits,                            the patient was deemed in satisfactory condition to                            undergo the procedure.                           After obtaining informed consent, the colonoscope                             was passed under direct vision. Throughout the                            procedure, the patient's blood pressure, pulse, and                            oxygen saturations were monitored continuously. The                            CF [id number] #[id number] was introduced through the anus                            and advanced to the the cecum, identified by                            appendiceal orifice and ileocecal valve. The                            colonoscopy was performed without difficulty. The                            patient tolerated the procedure well. The quality                            of the bowel preparation was good. The ileocecal                            valve, appendiceal orifice, and rectum were                            photographed. Scope In: 2:57:35 PM Scope Out: 3:10:53 PM Scope Withdrawal Time: 0 hours 10 minutes 29 seconds  Total Procedure Duration: 0 hours 13 minutes 18 seconds  Findings:                 The perianal and digital rectal examinations were  normal.                           Inflammation was found in a continuous and                            circumferential pattern from the anal verge to the                            recto-sigmoid colon, with transition to normal                            mucosa at 25 cm from the anal verge. This was                            graded as Mayo Score 1 (mild, with erythema,                            decreased vascular pattern, mild friability), and                            overall improved compared with the index                            colonoscopy. Biopsies were taken with a cold                            forceps for histology. Estimated blood loss was                            minimal.                           The mucosa was otherwise normal from the sigmoid                            colon through the cecum. Biopsies were taken                            throughout  the colon with a cold forceps for                            histology. Estimated blood loss was minimal.                           Retroflexion in the rectum was not performed due to                            active colitis and narrowed rectal vault. Complications:            No immediate complications. Estimated Blood Loss:     Estimated blood loss was minimal. Impression:               - Mild (Mayo Score 1) ulcerative colitis in the  rectum and recto-sigmoid colon, with transition to                            normal appearing mucosa at 25 cm from the anal                            verge. This overall appeared better than the                            previous colonoscopy. The remainder of the colon                            was normal appearing, consistent with good response                            to Entyvio. This was biopsied throughout the colon.                           - Will continue Entyvio as planned, but can add                            Canasa suppository for relief of persistent                            proctitis. Recommendation:           - Patient has a contact number available for                            emergencies. The signs and symptoms of potential                            delayed complications were discussed with the                            patient. Return to normal activities tomorrow.                            Written discharge instructions were provided to the                            patient.                           - Resume previous diet.                           - Continue present medications.                           - Await pathology results.                           - Repeat colonoscopy for surveillance based on  pathology results.                           - Continue Entyvio as scheduled.                           - Use Canasa 1000 mg suppository 1 per rectum QHS                             for 2 weeks for treatment of persistent proctitis,                            and can use intermittently if distal symptoms recur.                           - Check vedolizumab trough and antibody 1 week                            prior to next infusion as previously planned.                           - Can check periodic fecal calprotectin as a                            surrogate marker for active inflammation.                           - Return to GI clinic in 3 months, or sooner as                            needed. Doristine Locks, MD 03/21/2023 3:22:50 PM

## 2023-03-21 NOTE — Progress Notes (Signed)
1500 HR > 100 with esmolol 25 mg given IV, MD updated, vss  

## 2023-03-21 NOTE — Progress Notes (Signed)
GASTROENTEROLOGY PROCEDURE H&P NOTE   Primary Care Physician: [name], DO    Reason for Procedure:   Ulcerative Colitis, hematochezia  Plan:    Colonoscopy  Patient is appropriate for endoscopic procedure(s) in the ambulatory ([facility]) setting.  The nature of the procedure, as well as the risks, benefits, and alternatives were carefully and thoroughly reviewed with the patient. Ample time for discussion and questions allowed. The patient understood, was satisfied, and agreed to proceed.     HPI: Willie James is a 22 y.o. male who presents for Colonoscopy for follow-up and ongoing surveillance of known UC Pancolitis.   Past Medical History:  Diagnosis Date   Frequent headaches    Left ventricular hypertrophy by electrocardiogram 10/28/2015   echo after was normal.    Palpitations 10/30/2015   pt reports palpitaitons and chest discomfort. EKG (reported LVH) and then NORMAL echo completed.    SIRS (systemic inflammatory response syndrome) (HCC) 12/15/2021   Ulcerative colitis [facility])     Past Surgical History:  Procedure Laterality Date   BIOPSY  12/17/2021   Procedure: BIOPSY;  Surgeon: Beverley Fiedler, MD;  Location: [facility];  Service: Gastroenterology;;  EGD and COLON   COLONOSCOPY N/A 12/17/2021   Procedure: COLONOSCOPY;  Surgeon: Beverley Fiedler, MD;  Location: [facility];  Service: Gastroenterology;  Laterality: N/A;   ESOPHAGOGASTRODUODENOSCOPY (EGD) WITH PROPOFOL N/A 12/17/2021   Procedure: ESOPHAGOGASTRODUODENOSCOPY (EGD) WITH PROPOFOL;  Surgeon: Beverley Fiedler, MD;  Location: [facility];  Service: Gastroenterology;  Laterality: N/A;   NO PAST SURGERIES      Prior to Admission medications   Medication Sig Start Date End Date Taking? Authorizing Provider  Cholecalciferol (VITAMIN D3) 125 MCG (5000 UT) CAPS Take 1 capsule (5,000 Units total) by mouth daily. 03/18/23  Yes Kuneff, Renee A, DO  Cyanocobalamin (B-12) 1000 MCG SUBL Place 1,000 mcg under the  tongue daily. 02/17/23   Kuneff, Renee A, DO  hydrOXYzine (ATARAX) 10 MG tablet Take 1 tablet (10 mg total) by mouth at bedtime. 02/16/23   Kuneff, Renee A, DO  rizatriptan (MAXALT) 5 MG tablet Take 1 tablet (5 mg total) by mouth as needed for migraine. May repeat in 2 hours if needed Patient not taking: Reported on 03/21/2023 02/16/23   Felix Pacini A, DO  vedolizumab (ENTYVIO) 300 MG injection Inject into the vein.    [provider]    Current Outpatient Medications  Medication Sig Dispense Refill   Cholecalciferol (VITAMIN D3) 125 MCG (5000 UT) CAPS Take 1 capsule (5,000 Units total) by mouth daily. 30 capsule 11   Cyanocobalamin (B-12) 1000 MCG SUBL Place 1,000 mcg under the tongue daily. 90 tablet 3   hydrOXYzine (ATARAX) 10 MG tablet Take 1 tablet (10 mg total) by mouth at bedtime. 30 tablet 0   rizatriptan (MAXALT) 5 MG tablet Take 1 tablet (5 mg total) by mouth as needed for migraine. May repeat in 2 hours if needed (Patient not taking: Reported on 03/21/2023) 10 tablet 5   vedolizumab (ENTYVIO) 300 MG injection Inject into the vein.     Current Facility-Administered Medications  Medication Dose Route Frequency Provider Last Rate Last Admin   0.9 %  sodium chloride infusion  500 mL Intravenous Continuous Sherrilyn Nairn V, DO        Allergies as of 03/21/2023   (No Known Allergies)    Family History  Problem Relation Age of Onset   Hyperlipidemia Father    Arthritis Maternal Grandmother  Diabetes Maternal Grandmother    Hyperlipidemia Maternal Grandmother    Hypertension Maternal Grandmother    Hypertension Maternal Grandfather    Hyperlipidemia Maternal Grandfather    Prostate cancer Maternal Grandfather    Cervical cancer Paternal Grandmother    Hypertension Paternal Grandfather    Heart disease Paternal Grandfather    Colon cancer Neg Hx    Esophageal cancer Neg Hx    Rectal cancer Neg Hx    Stomach cancer Neg Hx     Social History   Socioeconomic  History   Marital status: Single    Spouse name: Not on file   Number of children: Not on file   Years of education: Not on file   Highest education level: Some college, no degree  Occupational History   Not on file  Tobacco Use   Smoking status: Never   Smokeless tobacco: Never  Vaping Use   Vaping status: Never Used  Substance and Sexual Activity   Alcohol use: Never   Drug use: Never   Sexual activity: Never  Other Topics Concern   Not on file  Social History Narrative   Marital status/children/pets: Single.    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[employer])   Safety:      -Wears a bicycle helmet riding a bike: Yes     -smoke alarm in the home:Yes     - wears seatbelt: Yes     - Feels safe in their relationships: Yes   Social Determinants of Health   Financial Resource Strain: Low Risk  (03/17/2023)   Overall Financial Resource Strain (CARDIA)    Difficulty of Paying Living Expenses: Not hard at all  Food Insecurity: No Food Insecurity (03/17/2023)   Hunger Vital Sign    Worried About Running Out of Food in the Last Year: Never true    Ran Out of Food in the Last Year: Never true  Transportation Needs: No Transportation Needs (03/17/2023)   PRAPARE - Administrator, Civil Service (Medical): No    Lack of Transportation (Non-Medical): No  Physical Activity: Insufficiently Active (03/17/2023)   Exercise Vital Sign    Days of Exercise per Week: 3 days    Minutes of Exercise per Session: 40 min  Stress: No Stress Concern Present (03/17/2023)   [employer] of Occupational Health - Occupational Stress Questionnaire    Feeling of Stress : Not at all  Social Connections: Unknown (03/17/2023)   Social Connection and Isolation Panel [NHANES]    Frequency of Communication with Friends and Family: More than three times a week    Frequency of Social Gatherings with Friends and Family: More than three times a week    Attends Religious Services: Never    Automotive engineer or Organizations: No    Attends Engineer, structural: Not on file    Marital Status: Patient declined  Recent Concern: Social Connections - Socially Isolated (02/15/2023)   Social Connection and Isolation Panel [NHANES]    Frequency of Communication with Friends and Family: More than three times a week    Frequency of Social Gatherings with Friends and Family: More than three times a week    Attends Religious Services: Never    Database administrator or Organizations: No    Attends Engineer, structural: Not on file    Marital Status: Never married  Intimate Partner Violence: Not on file    Physical Exam: Vital signs in last 24 hours: @BP  (!) 146/107  Pulse 64   Temp 98 F (36.7 C)   Resp (!) 25   Ht 5\' 3"  (1.6 m)   Wt 215 lb (97.5 kg)   SpO2 99%   BMI 38.09 kg/m  GEN: NAD EYE: Sclerae anicteric ENT: MMM CV: Non-tachycardic Pulm: CTA b/l GI: Soft, NT/ND NEURO:  Alert & Oriented x 3   Doristine Locks, DO [facility]   03/21/2023 2:50 PM

## 2023-03-21 NOTE — Progress Notes (Signed)
1507 HR > 100 with esmolol 25 mg given IV, MD updated, vss 

## 2023-03-21 NOTE — Patient Instructions (Addendum)
Resume previous diet Continue present medications Await pathology results Handouts/ information given for polyps, diverticulosis and hemorrhoids  YOU HAD AN ENDOSCOPIC PROCEDURE TODAY AT THE [facility]:   Refer to the procedure report that was given to you for any specific questions about what was found during the examination.  If the procedure report does not answer your questions, please call your gastroenterologist to clarify.  If you requested that your care partner not be given the details of your procedure findings, then the procedure report has been included in a sealed envelope for you to review at your convenience later.  YOU SHOULD EXPECT: Some feelings of bloating in the abdomen. Passage of more gas than usual.  Walking can help get rid of the air that was put into your GI tract during the procedure and reduce the bloating. If you had a lower endoscopy (such as a colonoscopy or flexible sigmoidoscopy) you may notice spotting of blood in your stool or on the toilet paper. If you underwent a bowel prep for your procedure, you may not have a normal bowel movement for a few days.  Please Note:  You might notice some irritation and congestion in your nose or some drainage.  This is from the oxygen used during your procedure.  There is no need for concern and it should clear up in a day or so.  SYMPTOMS TO REPORT IMMEDIATELY:  Following lower endoscopy (colonoscopy or flexible sigmoidoscopy):  Excessive amounts of blood in the stool  Significant tenderness or worsening of abdominal pains  Swelling of the abdomen that is new, acute  Fever of 100F or higher  For urgent or emergent issues, a gastroenterologist can be reached at any hour by calling [phone]. Do not use MyChart messaging for urgent concerns.    DIET:  We do recommend a small meal at first, but then you may proceed to your regular diet.  Drink plenty of fluids but you should avoid alcoholic beverages for 24  hours.  ACTIVITY:  You should plan to take it easy for the rest of today and you should NOT DRIVE or use heavy machinery until tomorrow (because of the sedation medicines used during the test).    FOLLOW UP: Our staff will call the number listed on your records the next business day following your procedure.  We will call around 7:15- 8:00 am to check on you and address any questions or concerns that you may have regarding the information given to you following your procedure. If we do not reach you, we will leave a message.     If any biopsies were taken you will be contacted by phone or by letter within the next 1-3 weeks.  Please call us at [phone] if you have not heard about the biopsies in 3 weeks.    SIGNATURES/CONFIDENTIALITY: You and/or your care partner have signed paperwork which will be entered into your electronic medical record.  These signatures attest to the fact that that the information above on your After Visit Summary has been reviewed and is understood.  Full responsibility of the confidentiality of this discharge information lies with you and/or your care-partner. 

## 2023-03-21 NOTE — Progress Notes (Signed)
VS by SS  Pt's states no medical or surgical changes since previsit or office visit.  

## 2023-03-21 NOTE — Progress Notes (Signed)
Called to room to assist during endoscopic procedure.  Patient ID and intended procedure confirmed with present staff. Received instructions for my participation in the procedure from the performing physician.  

## 2023-03-21 NOTE — Progress Notes (Signed)
Report given to PACU, vss 

## 2023-03-22 ENCOUNTER — Telehealth: Payer: Self-pay

## 2023-03-22 NOTE — Telephone Encounter (Signed)
  Follow up Call-     03/21/2023    2:05 PM  Call back number  Post procedure Call Back phone  # [phone]  Permission to leave phone message Yes     Patient questions:  Do you have a fever, pain , or abdominal swelling? No. Pain Score  0 *  Have you tolerated food without any problems? Yes.    Have you been able to return to your normal activities? Yes.    Do you have any questions about your discharge instructions: Diet   No. Medications  No. Follow up visit  No.  Do you have questions or concerns about your Care? No.  Actions: * If pain score is 4 or above: No action needed, pain <4.

## 2023-03-24 ENCOUNTER — Ambulatory Visit (INDEPENDENT_AMBULATORY_CARE_PROVIDER_SITE_OTHER): Payer: 59 | Admitting: *Deleted

## 2023-03-24 VITALS — BP 124/82 | HR 86 | Temp 98.0°F | Resp 16 | Ht 63.0 in | Wt 213.4 lb

## 2023-03-24 DIAGNOSIS — K51919 Ulcerative colitis, unspecified with unspecified complications: Secondary | ICD-10-CM | POA: Diagnosis not present

## 2023-03-24 DIAGNOSIS — K921 Melena: Secondary | ICD-10-CM | POA: Diagnosis not present

## 2023-03-24 LAB — SURGICAL PATHOLOGY

## 2023-03-24 MED ORDER — SODIUM CHLORIDE 0.9 % IV SOLN
300.0000 mg | Freq: Once | INTRAVENOUS | Status: AC
  Administered 2023-03-24: 300 mg via INTRAVENOUS
  Filled 2023-03-24: qty 5

## 2023-03-24 NOTE — Progress Notes (Signed)
Diagnosis: Ulcerative Colitis  Provider:  Chilton Greathouse MD  Procedure: IV Infusion  IV Type: Peripheral, IV Location: R Antecubital  Entyvio (Vedolizumab), Dose: 300 mg  Infusion Start Time: 1011 am  Infusion Stop Time: 1045 am  Post Infusion IV Care: Observation period completed and Peripheral IV Discontinued  Discharge: Condition: Good, Destination: Home . AVS Declined  Performed by:  Forrest Moron, RN

## 2023-03-29 ENCOUNTER — Other Ambulatory Visit: Payer: Self-pay

## 2023-03-29 DIAGNOSIS — K51919 Ulcerative colitis, unspecified with unspecified complications: Secondary | ICD-10-CM

## 2023-03-29 MED ORDER — MESALAMINE 1000 MG RE SUPP: 1000.0000 mg | Freq: Every day | RECTAL | 1 refills | Status: DC

## 2023-04-25 ENCOUNTER — Inpatient Hospital Stay: Payer: 59 | Attending: Hematology and Oncology

## 2023-04-25 ENCOUNTER — Other Ambulatory Visit: Payer: Self-pay

## 2023-04-25 DIAGNOSIS — Z8042 Family history of malignant neoplasm of prostate: Secondary | ICD-10-CM | POA: Diagnosis not present

## 2023-04-25 DIAGNOSIS — D509 Iron deficiency anemia, unspecified: Secondary | ICD-10-CM | POA: Diagnosis present

## 2023-04-25 DIAGNOSIS — K5190 Ulcerative colitis, unspecified, without complications: Secondary | ICD-10-CM | POA: Insufficient documentation

## 2023-04-25 DIAGNOSIS — D500 Iron deficiency anemia secondary to blood loss (chronic): Secondary | ICD-10-CM

## 2023-04-25 DIAGNOSIS — Z79899 Other long term (current) drug therapy: Secondary | ICD-10-CM | POA: Diagnosis not present

## 2023-04-25 DIAGNOSIS — Z83438 Family history of other disorder of lipoprotein metabolism and other lipidemia: Secondary | ICD-10-CM | POA: Diagnosis not present

## 2023-04-25 DIAGNOSIS — Z8049 Family history of malignant neoplasm of other genital organs: Secondary | ICD-10-CM | POA: Insufficient documentation

## 2023-04-25 DIAGNOSIS — Z833 Family history of diabetes mellitus: Secondary | ICD-10-CM | POA: Diagnosis not present

## 2023-04-25 DIAGNOSIS — Z8261 Family history of arthritis: Secondary | ICD-10-CM | POA: Diagnosis not present

## 2023-04-25 DIAGNOSIS — Z8249 Family history of ischemic heart disease and other diseases of the circulatory system: Secondary | ICD-10-CM | POA: Diagnosis not present

## 2023-04-25 LAB — CBC WITH DIFFERENTIAL (CANCER CENTER ONLY)
Abs Immature Granulocytes: 0.02 10*3/uL (ref 0.00–0.07)
Basophils Absolute: 0.1 10*3/uL (ref 0.0–0.1)
Basophils Relative: 1 %
Eosinophils Absolute: 0.2 10*3/uL (ref 0.0–0.5)
Eosinophils Relative: 2 %
HCT: 47.1 % (ref 39.0–52.0)
Hemoglobin: 16.8 g/dL (ref 13.0–17.0)
Immature Granulocytes: 0 %
Lymphocytes Relative: 43 %
Lymphs Abs: 3.4 10*3/uL (ref 0.7–4.0)
MCH: 30.9 pg (ref 26.0–34.0)
MCHC: 35.7 g/dL (ref 30.0–36.0)
MCV: 86.7 fL (ref 80.0–100.0)
Monocytes Absolute: 0.5 10*3/uL (ref 0.1–1.0)
Monocytes Relative: 7 %
Neutro Abs: 3.8 10*3/uL (ref 1.7–7.7)
Neutrophils Relative %: 47 %
Platelet Count: 294 10*3/uL (ref 150–400)
RBC: 5.43 MIL/uL (ref 4.22–5.81)
RDW: 12.9 % (ref 11.5–15.5)
WBC Count: 8 10*3/uL (ref 4.0–10.5)
nRBC: 0 % (ref 0.0–0.2)

## 2023-04-25 LAB — IRON AND IRON BINDING CAPACITY (CC-WL,HP ONLY)
Iron: 89 ug/dL (ref 45–182)
Saturation Ratios: 24 % (ref 17.9–39.5)
TIBC: 374 ug/dL (ref 250–450)
UIBC: 285 ug/dL (ref 117–376)

## 2023-04-25 LAB — FERRITIN: Ferritin: 39 ng/mL (ref 24–336)

## 2023-04-28 ENCOUNTER — Inpatient Hospital Stay: Payer: 59 | Admitting: Adult Health

## 2023-04-28 DIAGNOSIS — D500 Iron deficiency anemia secondary to blood loss (chronic): Secondary | ICD-10-CM | POA: Diagnosis not present

## 2023-04-28 DIAGNOSIS — K51919 Ulcerative colitis, unspecified with unspecified complications: Secondary | ICD-10-CM

## 2023-04-28 NOTE — Assessment & Plan Note (Signed)
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 Occasional blood streaking in stool, but overall improvement in colitis symptoms. Patient reports occasional low energy. -Continue current medication regimen. -No indication for IV iron at this time. -Check labs in 6 months or sooner if symptoms of anemia or increased blood in stool occur.  Colitis Improvement in symptoms and colonoscopy findings. Currently managed by Dr. Barron Alvine. -Continue current treatment plan as directed by Dr. Barron Alvine. -Contact Dr. Barron Alvine if symptoms worsen.  Follow-up Patient prefers virtual visits. -Schedule virtual visit in 6 months or sooner if symptoms worsen.

## 2023-04-28 NOTE — Progress Notes (Signed)
[facility] Cancer Follow up:    Willie Leatherwood, DO [address] [place] [address]  I connected with Willie James on 04/28/23 at  8:30 AM EST by telephone and verified that I am speaking with the correct person using two identifiers.  I discussed the limitations, risks, security and privacy concerns of performing an evaluation and management service by telephone and the availability of in person appointments.  I also discussed with the patient that there may be a patient responsible charge related to this service. The patient expressed understanding and agreed to proceed.  Patient location: home Provider location: [facility] office  DIAGNOSIS: IRON DEFICIENCY ANEMIA   SUMMARY OF HEMATOLOGIC HISTORY: History of ulcerative colitis diagnosed in 11/2021 02/10/2022 diagnosed with Iron deficiency anemia with ferritin of 9, saturation 4.8% Receives IV iron intermittently; 12/2021, 03/2022, 07/2022    CURRENT THERAPY: intermittent IV iron  INTERVAL HISTORY: Discussed the use of AI scribe software for clinical note transcription with the patient, who gave verbal consent to proceed.  Willie James 22 y.o. male returns for f/u around his known history of iron deficiency secondary to colitis. They have been under the care of Dr. Matt Holmes, with the most recent visit in October. The patient reports that their colitis has improved significantly, with the disease now localized to a small area near the rectum, as confirmed by a recent colonoscopy. This is a marked improvement from previous widespread intestinal involvement.  Occasional blood streaking in the stool is still present, but the frequency has decreased. The patient has been using prescribed medication and a suppository for proctitis, which aligns with the rectal area issue. Lab tests show stable hemoglobin levels and satisfactory iron studies.  From an energy standpoint, the patient reports occasional low energy days but  generally feels normal.     Patient Active Problem List   Diagnosis Date Noted   Increased frequency of headaches 03/17/2023   B12 deficiency 03/17/2023   Vitamin D deficiency 11/30/2022   Acute esophagitis    Ulcerative colitis (HCC) 12/15/2021   IDA (iron deficiency anemia) 12/10/2021   Hypertriglyceridemia 01/07/2020   Obesity (BMI 30-39.9) 01/03/2018   Palpitations 01/03/2018    has No Known Allergies.  MEDICAL HISTORY: Past Medical History:  Diagnosis Date   Frequent headaches    Left ventricular hypertrophy by electrocardiogram 10/28/2015   echo after was normal.    Palpitations 10/30/2015   pt reports palpitaitons and chest discomfort. EKG (reported LVH) and then NORMAL echo completed.    SIRS (systemic inflammatory response syndrome) (HCC) 12/15/2021   Ulcerative colitis (HCC)     SURGICAL HISTORY: Past Surgical History:  Procedure Laterality Date   BIOPSY  12/17/2021   Procedure: BIOPSY;  Surgeon: Beverley Fiedler, MD;  Location: [facility];  Service: Gastroenterology;;  EGD and COLON   COLONOSCOPY N/A 12/17/2021   Procedure: COLONOSCOPY;  Surgeon: Beverley Fiedler, MD;  Location: [facility];  Service: Gastroenterology;  Laterality: N/A;   ESOPHAGOGASTRODUODENOSCOPY (EGD) WITH PROPOFOL N/A 12/17/2021   Procedure: ESOPHAGOGASTRODUODENOSCOPY (EGD) WITH PROPOFOL;  Surgeon: Beverley Fiedler, MD;  Location: [facility];  Service: Gastroenterology;  Laterality: N/A;   NO PAST SURGERIES      SOCIAL HISTORY: Social History   Socioeconomic History   Marital status: Single    Spouse name: Not on file   Number of children: Not on file   Years of education: Not on file   Highest education level: Some college, no degree  Occupational History  Not on file  Tobacco Use   Smoking status: Never   Smokeless tobacco: Never  Vaping Use   Vaping status: Never Used  Substance and Sexual Activity   Alcohol use: Never   Drug use: Never   Sexual activity: Never  Other Topics  Concern   Not on file  Social History Narrative   Marital status/children/pets: Single.    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[employer])   Safety:      -Wears a bicycle helmet riding a bike: Yes     -smoke alarm in the home:Yes     - wears seatbelt: Yes     - Feels safe in their relationships: Yes   Social Determinants of Health   Financial Resource Strain: Low Risk  (03/17/2023)   Overall Financial Resource Strain (CARDIA)    Difficulty of Paying Living Expenses: Not hard at all  Food Insecurity: No Food Insecurity (03/17/2023)   Hunger Vital Sign    Worried About Running Out of Food in the Last Year: Never true    Ran Out of Food in the Last Year: Never true  Transportation Needs: No Transportation Needs (03/17/2023)   PRAPARE - Administrator, Civil Service (Medical): No    Lack of Transportation (Non-Medical): No  Physical Activity: Insufficiently Active (03/17/2023)   Exercise Vital Sign    Days of Exercise per Week: 3 days    Minutes of Exercise per Session: 40 min  Stress: No Stress Concern Present (03/17/2023)   [employer] of Occupational Health - Occupational Stress Questionnaire    Feeling of Stress : Not at all  Social Connections: Unknown (03/17/2023)   Social Connection and Isolation Panel [NHANES]    Frequency of Communication with Friends and Family: More than three times a week    Frequency of Social Gatherings with Friends and Family: More than three times a week    Attends Religious Services: Never    Database administrator or Organizations: No    Attends Engineer, structural: Not on file    Marital Status: Patient declined  Recent Concern: Social Connections - Socially Isolated (02/15/2023)   Social Connection and Isolation Panel [NHANES]    Frequency of Communication with Friends and Family: More than three times a week    Frequency of Social Gatherings with Friends and Family: More than three times a week    Attends Religious  Services: Never    Database administrator or Organizations: No    Attends Engineer, structural: Not on file    Marital Status: Never married  Catering manager Violence: Not on file    FAMILY HISTORY: Family History  Problem Relation Age of Onset   Hyperlipidemia Father    Arthritis Maternal Grandmother    Diabetes Maternal Grandmother    Hyperlipidemia Maternal Grandmother    Hypertension Maternal Grandmother    Hypertension Maternal Grandfather    Hyperlipidemia Maternal Grandfather    Prostate cancer Maternal Grandfather    Cervical cancer Paternal Grandmother    Hypertension Paternal Grandfather    Heart disease Paternal Grandfather    Colon cancer Neg Hx    Esophageal cancer Neg Hx    Rectal cancer Neg Hx    Stomach cancer Neg Hx     Review of Systems  Constitutional:  Negative for appetite change, chills, fatigue, fever and unexpected weight change.  HENT:   Negative for hearing loss, lump/mass and trouble swallowing.   Eyes:  Negative for eye problems and  icterus.  Respiratory:  Negative for chest tightness, cough and shortness of breath.   Cardiovascular:  Negative for chest pain, leg swelling and palpitations.  Gastrointestinal:  Negative for abdominal distention, abdominal pain, constipation, diarrhea, nausea and vomiting.  Endocrine: Negative for hot flashes.  Genitourinary:  Negative for difficulty urinating.   Musculoskeletal:  Negative for arthralgias.  Skin:  Negative for itching and rash.  Neurological:  Negative for dizziness, extremity weakness, headaches and numbness.  Hematological:  Negative for adenopathy. Does not bruise/bleed easily.  Psychiatric/Behavioral:  Negative for depression. The patient is not nervous/anxious.       PHYSICAL EXAMINATION Patient sounds well, in no apparent distress, mood and behavior are normal, speech is normal   LABORATORY DATA:  CBC    Component Value Date/Time   WBC 8.0 04/25/2023 0957   WBC 4.1  03/11/2022 1545   RBC 5.43 04/25/2023 0957   HGB 16.8 04/25/2023 0957   HCT 47.1 04/25/2023 0957   PLT 294 04/25/2023 0957   MCV 86.7 04/25/2023 0957   MCH 30.9 04/25/2023 0957   MCHC 35.7 04/25/2023 0957   RDW 12.9 04/25/2023 0957   LYMPHSABS 3.4 04/25/2023 0957   MONOABS 0.5 04/25/2023 0957   EOSABS 0.2 04/25/2023 0957   BASOSABS 0.1 04/25/2023 0957    CMP     Component Value Date/Time   NA 137 11/29/2022 0800   K 3.9 11/29/2022 0800   CL 102 11/29/2022 0800   CO2 23 11/29/2022 0800   GLUCOSE 93 11/29/2022 0800   BUN 15 11/29/2022 0800   CREATININE 0.94 11/29/2022 0800   CALCIUM 9.8 02/16/2023 1116   PROT 7.5 11/29/2022 0800   ALBUMIN 4.5 11/29/2022 0800   AST 16 11/29/2022 0800   ALT 14 11/29/2022 0800   ALKPHOS 77 11/29/2022 0800   BILITOT 0.6 11/29/2022 0800   GFRNONAA >60 12/17/2021 0824       ASSESSMENT and THERAPY PLAN:   Ulcerative colitis (HCC) 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 Occasional blood streaking in stool, but overall improvement in colitis symptoms. Patient reports occasional low energy. -Continue current medication regimen. -No indication for IV iron at this time. -Check labs in 6 months or sooner if symptoms of anemia or increased blood in stool occur.  Colitis Improvement in symptoms and colonoscopy findings. Currently managed by Dr. Barron Alvine. -Continue current treatment plan as directed by Dr. Barron Alvine. -Contact Dr. Barron Alvine if symptoms worsen.  Follow-up Patient prefers virtual visits. -Schedule virtual visit in 6 months or sooner if symptoms worsen.    All questions were answered. The patient knows to call the clinic with any problems, questions or concerns. We can certainly see the patient much sooner if necessary.  Follow up instructions:    -Return to cancer center in 6 months for lab and virtual f/u  The patient was provided an opportunity to ask questions and all were  answered. The patient agreed with the plan and demonstrated an understanding of the instructions.   The patient was advised to call back or seek an in-person evaluation if the symptoms worsen or if the condition fails to improve as anticipated.   I provided 10 minutes of non face-to-face telephone visit time during this encounter, and > 50% was spent counseling as documented under my assessment & plan.  Lillard Anes, NP 04/28/23 8:54 AM Medical Oncology and Hematology [facility] [address] [place], [place] [address] Tel. [phone]    Fax. [phone]

## 2023-05-02 ENCOUNTER — Telehealth: Payer: Self-pay | Admitting: Adult Health

## 2023-05-02 NOTE — Telephone Encounter (Signed)
Patient is aware of scheduled appointment times/dates

## 2023-05-19 ENCOUNTER — Ambulatory Visit: Payer: 59 | Admitting: *Deleted

## 2023-05-19 VITALS — BP 137/84 | HR 100 | Temp 98.3°F | Resp 16 | Ht 63.0 in | Wt 217.0 lb

## 2023-05-19 DIAGNOSIS — K51919 Ulcerative colitis, unspecified with unspecified complications: Secondary | ICD-10-CM | POA: Diagnosis not present

## 2023-05-19 DIAGNOSIS — K921 Melena: Secondary | ICD-10-CM

## 2023-05-19 DIAGNOSIS — D500 Iron deficiency anemia secondary to blood loss (chronic): Secondary | ICD-10-CM | POA: Diagnosis not present

## 2023-05-19 MED ORDER — VEDOLIZUMAB 300 MG IV SOLR
300.0000 mg | Freq: Once | INTRAVENOUS | Status: AC
Start: 2023-05-19 — End: 2023-05-19
  Administered 2023-05-19: 300 mg via INTRAVENOUS
  Filled 2023-05-19: qty 5

## 2023-05-19 NOTE — Progress Notes (Signed)
Diagnosis: Ulcerative Colitis  Provider:  Chilton Greathouse MD  Procedure: IV Infusion  IV Type: Peripheral, IV Location: L Forearm  Entyvio (Vedolizumab), Dose: 300 mg  Infusion Start Time: 1120 am  Infusion Stop Time: 1153 am  Post Infusion IV Care: Observation period completed and Peripheral IV Discontinued  Discharge: Condition: Good, Destination: Home . AVS Provided  Performed by:  Forrest Moron, RN

## 2023-06-24 ENCOUNTER — Encounter: Payer: Self-pay | Admitting: Hematology and Oncology

## 2023-06-24 ENCOUNTER — Encounter: Payer: Self-pay | Admitting: Gastroenterology

## 2023-06-28 ENCOUNTER — Encounter: Payer: Self-pay | Admitting: Gastroenterology

## 2023-06-28 ENCOUNTER — Ambulatory Visit: Payer: 59 | Admitting: Gastroenterology

## 2023-06-28 VITALS — BP 110/70 | Ht 63.0 in | Wt 219.0 lb

## 2023-06-28 DIAGNOSIS — K51919 Ulcerative colitis, unspecified with unspecified complications: Secondary | ICD-10-CM

## 2023-06-28 DIAGNOSIS — K5100 Ulcerative (chronic) pancolitis without complications: Secondary | ICD-10-CM

## 2023-06-28 DIAGNOSIS — E559 Vitamin D deficiency, unspecified: Secondary | ICD-10-CM

## 2023-06-28 DIAGNOSIS — D500 Iron deficiency anemia secondary to blood loss (chronic): Secondary | ICD-10-CM

## 2023-06-28 DIAGNOSIS — D509 Iron deficiency anemia, unspecified: Secondary | ICD-10-CM | POA: Diagnosis not present

## 2023-06-28 NOTE — Progress Notes (Signed)
 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   - Started having generalized abdominal pain and hematochezia in spring 2023 - 11/02/2021: Normal CBC, TSH, iron  panel, FOBT positive, CRP 13.2 - 11/13/2021: ER evaluation. WBC 12.9, H/H 13/37.6, PLT 463, Iron  27, TIBC 253, sat 11%, normal CMP, CRP 3.9 - 11/13/2021: CT A/P: Diffuse colonic wall thickening.  Normal stomach, small bowel.  No lymphadenopathy - 12/09/2021: Initial appointment GI clinic.  WBC 13.5, H/H 9.8/29.4, ferritin 25, iron  11, TIBC 304, sat 3.6%.  Folate 8.2, normal B12.  Negative/normal GI PCR panel.  Calprotectin 1500 - 7/25-29, 2023: Hospital admission with hematochezia, abdominal pain, symptomatic anemia, fever.  Treated with IV Solu-Medrol  with improvement, IV iron , 1 unit PRBC transfusion.  QuantiFERON gold indeterminate. HBsAg- - 12/15/2021: CT A/P: Pancolonic inflammation slightly worse compared with prior study - 12/17/2021: EGD: LA Grade A esophagitis, mild gastritis, moderate duodenitis - 12/17/2021: Colonoscopy: Moderate pancolitis.  Normal TI - 01/11/2022: Follow-up in GI clinic.  Was doing better on prednisone  and Lialda .  Completed IV iron  x3.  VZV Ab+ c/w appropriate immunity.  Started on Zeposia  01/2022 - 03/30/2022: Follow-up in the GI clinic.  Feeling much better since starting supposedly with bowel habits back to baseline but still BRB mixed in stool.  Calprotectin 2290, ESR 44, CRP normal.  Added Uceris  - 06/08/2022: Follow-up in GI clinic.  No appreciable improvement with Uceris .  Stopped the Zeposia , started prednisone  and Entyvio .  Good response to IV iron . - 02/01/2023: Follow-up in GI clinic.  Doing well with Entyvio .  Formed stools at baseline 3-4 BM/day.  Normal CBC and improved iron  indices.  Calprotectin 272, normal ESR/CRP.  Vitamin D  17.8.  Normal B12/folate - 03/21/2023: Colonoscopy: Mild inflammation from anal verge to rectosigmoid with  transition to normal mucosa at 25 cm, graded as Mayo 1 (path: Mildly active, chronic proctitis).  Remainder of the colon was normal (path benign).  Started Canasa  1000 mg nightly for proctitis  HPI:     Patient is a 23 y.o. male presenting to the Gastroenterology Clinic for follow-up.  Was last seen by me on 02/01/2023 and was doing well with Entyvio .  Today, he states he feels well. Had some soft stools, so he used Canasa  supp for 2-3 days with good effect. Doing well with Entyvio . Next infusion 07/14/23. Rare BRBPR, but he states that actually only happens when he eats spicy chips with red pepper coating.  Otherwise never sees blood.  Reviewed most recent labs in 04/2023: CBC now normal with H/H 16.8/47, ferritin 39, iron  89, TIBC 374, sat 24%.  Does have vitamin D  deficiency from labs in 02/2023.  No new abdominal imaging for review.  Review of systems:     No chest pain, no SOB, no fevers, no urinary sx   Past Medical History:  Diagnosis Date   Frequent headaches    Left ventricular hypertrophy by electrocardiogram 10/28/2015   echo after was normal.    Palpitations 10/30/2015   pt reports palpitaitons and chest discomfort. EKG (reported LVH) and then NORMAL echo completed.    SIRS (systemic inflammatory response syndrome) (HCC) 12/15/2021   Ulcerative colitis (HCC)     Patient's surgical history, family medical history, social history, medications and allergies were all reviewed in Epic    Current Outpatient Medications  Medication Sig Dispense Refill   Cholecalciferol (VITAMIN D3) 125 MCG (5000 UT) CAPS Take 1 capsule (5,000 Units total) by mouth daily. 30  capsule 11   Cyanocobalamin  (B-12) 1000 MCG SUBL Place 1,000 mcg under the tongue daily. 90 tablet 3   hydrOXYzine  (ATARAX ) 10 MG tablet Take 1 tablet (10 mg total) by mouth at bedtime. 30 tablet 0   mesalamine  (CANASA ) 1000 MG suppository Place 1 suppository (1,000 mg total) rectally at bedtime for 14 days. Use suppository  rectally at bedtime for 14 days then, use intermittently (not continuously) if symptoms occur. 30 suppository 1   rizatriptan  (MAXALT ) 5 MG tablet Take 1 tablet (5 mg total) by mouth as needed for migraine. May repeat in 2 hours if needed (Patient not taking: Reported on 03/21/2023) 10 tablet 5   vedolizumab  (ENTYVIO ) 300 MG injection Inject into the vein.     No current facility-administered medications for this visit.    Physical Exam:     There were no vitals taken for this visit.  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  Partial response to Lialda , primary nonresponder to Zeposia , minimal response to Uceris .  Has since had excellent clinical response to Entyvio .  Will use Canasa  suppository on demand if mild flare/breakthrough with good effect.  - Check vedolizumab  trough and antibody next week (1 week prior to next infusion) - Check fecal calprotectin next week for continued proactive monitoring - Has appointment with Dermatology later this month for annual skin check  2) Iron  deficiency anemia Secondary to UC.  Excellent response to IV iron  with normal CBC and iron  panel in 04/2023. - Continue periodic monitoring of CBC  3) Vitamin D  deficiency - Continue vitamin D  supplement and follow-up with PCM  RTC in 6 months or sooner as needed          Sandor LULLA Flatter ,DO, FACG 06/28/2023, 9:48 AM

## 2023-06-28 NOTE — Patient Instructions (Addendum)
 Your provider has requested that you go to the basement level for lab work before leaving today to pick up stool kit only. Please come on or around 07/07/23 for blood draw, prior to taking Entyvio . Press B on the elevator. The lab is located at the first door on the left as you exit the elevator.  Follow-up in 6 months. Office to contact you to schedule. If you have not heard from our office by July 2025, please call office at [phone] to schedule appointment.   _______________________________________________________  If your blood pressure at your visit was 140/90 or greater, please contact your primary care physician to follow up on this.  _______________________________________________________  If you are age 40 or older, your body mass index should be between 23-30. Your Body mass index is 38.79 kg/m. If this is out of the aforementioned range listed, please consider follow up with your Primary Care Provider.  If you are age 37 or younger, your body mass index should be between 19-25. Your Body mass index is 38.79 kg/m. If this is out of the aformentioned range listed, please consider follow up with your Primary Care Provider.   ________________________________________________________  The [place] [facility] providers would like to encourage you to use MYCHART to communicate with providers for non-urgent requests or questions.  Due to long hold times on the telephone, sending your provider a message by [facility] may be a faster and more efficient way to get a response.  Please allow 48 business hours for a response.  Please remember that this is for non-urgent requests.  _______________________________________________________  It was a pleasure to see you today!  Vito Cirigliano, D.O.

## 2023-07-08 ENCOUNTER — Other Ambulatory Visit: Payer: 59

## 2023-07-08 DIAGNOSIS — K51919 Ulcerative colitis, unspecified with unspecified complications: Secondary | ICD-10-CM

## 2023-07-12 ENCOUNTER — Telehealth: Payer: Self-pay | Admitting: Pharmacy Technician

## 2023-07-12 NOTE — Telephone Encounter (Signed)
Auth Submission: PENDING Site of cre: Site of care: [facility] Payer: [employer] Medication & CPT/J Code(s) submitted: Entyvio (Vedolizumab) [id number] Route of submission (phone, fax, portal): PORTAL Phone # Fax # Auth type: Buy/Bill PB Units/visits requested: 300MG  [id number] Reference number: [id number] Approval from:  to

## 2023-07-14 ENCOUNTER — Ambulatory Visit: Payer: 59

## 2023-07-14 VITALS — BP 128/85 | HR 86 | Temp 97.9°F | Resp 18 | Ht 63.0 in | Wt 219.2 lb

## 2023-07-14 DIAGNOSIS — K921 Melena: Secondary | ICD-10-CM | POA: Diagnosis not present

## 2023-07-14 DIAGNOSIS — K51919 Ulcerative colitis, unspecified with unspecified complications: Secondary | ICD-10-CM | POA: Diagnosis not present

## 2023-07-14 LAB — CALPROTECTIN: Calprotectin: 49 ug/g

## 2023-07-14 MED ORDER — VEDOLIZUMAB 300 MG IV SOLR
300.0000 mg | Freq: Once | INTRAVENOUS | Status: AC
  Administered 2023-07-14: 300 mg via INTRAVENOUS
  Filled 2023-07-14: qty 5

## 2023-07-14 NOTE — Progress Notes (Signed)
Diagnosis: Ulcerative Colitis  Provider:  Chilton Greathouse MD  Procedure: IV Infusion  IV Type: Peripheral, IV Location: R Antecubital  Entyvio (Vedolizumab), Dose: 300 mg  Infusion Start Time: 1104  Infusion Stop Time: 1137  Post Infusion IV Care: Peripheral IV Discontinued  Discharge: Condition: Good, Destination: Home . AVS Declined  Performed by:  Adriana Mccallum, RN

## 2023-07-15 ENCOUNTER — Encounter: Payer: Self-pay | Admitting: Hematology and Oncology

## 2023-07-15 ENCOUNTER — Encounter: Payer: Self-pay | Admitting: Gastroenterology

## 2023-07-18 ENCOUNTER — Encounter: Payer: Self-pay | Admitting: Gastroenterology

## 2023-07-19 ENCOUNTER — Encounter: Payer: Self-pay | Admitting: Dermatology

## 2023-07-19 LAB — SERIAL MONITORING

## 2023-07-19 LAB — VEDOLIZUMAB AND ANTI-VEDO AB: Vedolizumab: 13 ug/mL

## 2023-07-21 ENCOUNTER — Ambulatory Visit: Payer: 59 | Admitting: Dermatology

## 2023-07-25 ENCOUNTER — Encounter: Payer: Self-pay | Admitting: Dermatology

## 2023-07-25 ENCOUNTER — Ambulatory Visit: Payer: 59 | Admitting: Dermatology

## 2023-07-25 VITALS — BP 120/80

## 2023-07-25 DIAGNOSIS — L219 Seborrheic dermatitis, unspecified: Secondary | ICD-10-CM | POA: Diagnosis not present

## 2023-07-25 DIAGNOSIS — L819 Disorder of pigmentation, unspecified: Secondary | ICD-10-CM | POA: Diagnosis not present

## 2023-07-25 MED ORDER — KETOCONAZOLE 2 % EX CREA: 1.0000 | TOPICAL_CREAM | Freq: Two times a day (BID) | CUTANEOUS | 5 refills | Status: AC

## 2023-07-25 NOTE — Patient Instructions (Addendum)
 Hello Willie James,  Thank you for visiting [place] today.   Here is a summary of the key instructions from today's consultation:  Moisturizing: Use the provided moisturizer samples daily after showering to keep your skin hydrated.  Ketoconazole Cream: Apply this cream daily for the next month to treat the irritation on your nose and cheeks, which is related to seborrheic dermatitis.  MelaB3 Serum: Use this serum for the next three months to help reduce excess pigment on your right cheek. It contains niacinamide, which hydrates the skin and helps with pigmentation.  Sunscreen: It is crucial to wear sunscreen every day to prevent dark patches from becoming permanent.  Follow-Up: Continue using the MelaB3 serum after the one-month treatment with ketoconazole. We will see you once a year to monitor your progress, and you have five refills for the ketoconazole to manage any flare-ups of seborrheic dermatitis.  We have provided samples of both the moisturizer and the MelaB3 serum for your convenience. Your prescription for ketoconazole has been sent to your pharmacy.  Please do not hesitate to contact us if you have any questions or if the symptoms recur. Take a picture and come in right away if the white patches reappear.  Wishing you the best in health,  Dr. Langston Reusing, Dermatology     Important Information   Due to recent changes in healthcare laws, you may see results of your pathology and/or laboratory studies on MyChart before the doctors have had a chance to review them. We understand that in some cases there may be results that are confusing or concerning to you. Please understand that not all results are received at the same time and often the doctors may need to interpret multiple results in order to provide you with the best plan of care or course of treatment. Therefore, we ask that you please give [place] 2 business days to thoroughly review all your results before contacting the office  for clarification. Should we see a critical lab result, you will be contacted sooner.     If You Need Anything After Your Visit   If you have any questions or concerns for your doctor, please call our main line at [phone]. If no one answers, please leave a voicemail as directed and we will return your call as soon as possible. Messages left after 4 pm will be answered the following business day.    You may also send [place] a message via MyChart. We typically respond to MyChart messages within 1-2 business days.  For prescription refills, please ask your pharmacy to contact our office. Our fax number is [phone].  If you have an urgent issue when the clinic is closed that cannot wait until the next business day, you can page your doctor at the number below.     Please note that while we do our best to be available for urgent issues outside of office hours, we are not available 24/7.    If you have an urgent issue and are unable to reach [place], you may choose to seek medical care at your doctor's office, retail clinic, urgent care center, or emergency room.   If you have a medical emergency, please immediately call 911 or go to the emergency department. In the event of inclement weather, please call our main line at [phone] for an update on the status of any delays or closures.  Dermatology Medication Tips: Please keep the boxes that topical medications come in in order to help keep track of the  instructions about where and how to use these. Pharmacies typically print the medication instructions only on the boxes and not directly on the medication tubes.   If your medication is too expensive, please contact our office at [phone] or send [place] a message through MyChart.    We are unable to tell what your co-pay for medications will be in advance as this is different depending on your insurance coverage. However, we may be able to find a substitute medication at lower cost or fill out  paperwork to get insurance to cover a needed medication.    If a prior authorization is required to get your medication covered by your insurance company, please allow [place] 1-2 business days to complete this process.   Drug prices often vary depending on where the prescription is filled and some pharmacies may offer cheaper prices.   The website www.goodrx.com contains coupons for medications through different pharmacies. The prices here do not account for what the cost may be with help from insurance (it may be cheaper with your insurance), but the website can give you the price if you did not use any insurance.  - You can print the associated coupon and take it with your prescription to the pharmacy.  - You may also stop by our office during regular business hours and pick up a GoodRx coupon card.  - If you need your prescription sent electronically to a different pharmacy, notify our office through [facility] or by phone at [phone]

## 2023-07-25 NOTE — Progress Notes (Signed)
   New Patient Visit   Subjective  Willie James is a 23 y.o. male who presents for the following: Hypopigmented areas on LUE  Patient states he has hypopigmented patches located at the LUE that he  would like to have examined. He reports the areas appeared after being diagnosed with UC. Patient reports the areas have been there for 2 years. He reports the areas are not bothersome. He states that the areas have not spread. Patient reports he  has not previously been treated for these areas. He states he noticed the area have gotten better since his UC is better controlled & the patches has dissipated. He is currently treated with Entyvio infusions every 2 months. Patient denies Hx of bx. Patient denies family history of any similar skin conditions.  Review of Systems:  No other skin or systemic complaints except as noted in HPI or Assessment and Plan.  Objective  Well appearing patient in no apparent distress; mood and affect are within normal limits.  A focused examination was performed of the following areas: LUE  Relevant exam findings are noted in the Assessment and Plan.    Assessment & Plan   Hypopigmented patch Assessment: Patient has a history of white patches on the arm that resolved after starting Entyvio infusions for inflammatory bowel disease. The clinician is unable to definitively diagnose whether these patches were vitiligo or post-inflammatory hypopigmentation from mild eczema (pityriasis alba) due to the resolution of symptoms. The improvement correlates with the initiation of Entyvio, which treats inflammation in the gut and potentially other areas of the body.  Plan: Maintain skin moisturization. Patient to take pictures and seek immediate consultation if patches reappear. Samples of moisturizers provided for daily use post-shower.  Seborrheic Dermatitis with Hyperpigmentation Assessment: Patient presents with a brown patch on the cheek, associated with flakiness.    Diagnosed as seborrheic dermatitis with secondary hyperpigmentation. The condition is attributed to yeast overgrowth on the skin, exacerbated by weather changes.  Plan:  Apply Ketoconazole cream daily for 1 month.   MelaB3 serum samples provided for use on the entire face, focusing on the hyperpigmented area, morning and night for 3 months. Continue MelaB3 after discontinuing ketoconazole.   Daily sunscreen application recommended. Ketoconazole prescription with 5 refills for future flares.   Annual follow-up appointment. Photograph of hyperpigmented area taken for progress tracking.  SEBORRHEIC DERMATITIS   Related Medications ketoconazole (NIZORAL) 2 % cream Apply 1 Application topically 2 (two) times daily.  Return in about 1 year (around 07/24/2024) for Hypopigmented lesion f/U.  Documentation: I have reviewed the above documentation for accuracy and completeness, and I agree with the above.  Stasia Cavalier, am acting as scribe for Langston Reusing, DO.  Langston Reusing, DO

## 2023-08-11 ENCOUNTER — Other Ambulatory Visit: Payer: Self-pay | Admitting: Family Medicine

## 2023-08-24 ENCOUNTER — Ambulatory Visit: Admitting: Family Medicine

## 2023-08-24 ENCOUNTER — Encounter: Payer: Self-pay | Admitting: Family Medicine

## 2023-08-24 VITALS — BP 122/82 | HR 95 | Temp 98.4°F | Wt 220.0 lb

## 2023-08-24 DIAGNOSIS — E538 Deficiency of other specified B group vitamins: Secondary | ICD-10-CM

## 2023-08-24 DIAGNOSIS — E559 Vitamin D deficiency, unspecified: Secondary | ICD-10-CM | POA: Diagnosis not present

## 2023-08-24 DIAGNOSIS — R519 Headache, unspecified: Secondary | ICD-10-CM | POA: Diagnosis not present

## 2023-08-24 DIAGNOSIS — G8929 Other chronic pain: Secondary | ICD-10-CM | POA: Diagnosis not present

## 2023-08-24 MED ORDER — B COMPLEX VITAMINS PO CAPS: 1.0000 | ORAL_CAPSULE | Freq: Every day | ORAL | 3 refills | Status: DC

## 2023-08-24 MED ORDER — RIZATRIPTAN BENZOATE 5 MG PO TABS: 5.0000 mg | ORAL_TABLET | ORAL | 11 refills | Status: DC | PRN

## 2023-08-24 MED ORDER — VITAMIN D3 125 MCG (5000 UT) PO CAPS: 5000.0000 [IU] | ORAL_CAPSULE | Freq: Every day | ORAL | 11 refills | Status: DC

## 2023-08-24 NOTE — Patient Instructions (Signed)

## 2023-08-24 NOTE — Progress Notes (Signed)
 Willie James , [birth year], 23 y.o., male MRN: [id number] Patient Care Team    Relationship Specialty Notifications Start End  [name], DO PCP - General Family Medicine  11/13/21   Shellia Cleverly, DO Consulting Physician Gastroenterology  04/28/23     Chief Complaint  Patient presents with   Headache     Subjective: Willie James is a 23 y.o. Pt presents for an OV follow-up on chronic headaches and vitamin deficiency,  Pt reports his headaches are about the same, not worsening. He has been using the maxalt when he has  headache and works well for him. He is in need of refills today. He did not start the vitamin D or B vitamins   Prior note: increased frequency of headaches and vitamin deficiencies.  Patient reports he did notice some increase in energy levels after starting the B12 and vitamin D.  He states his regular headaches are still present, but not as frequent.  He has had 3 migraines since last being seen and did try the Maxalt and states it did work for him.  He has received iron infusions through [facility]. He reports he was unable to tolerate the hydroxyzine 10 mg nightly.  He tried it once and "felt weird "and reported he could not feel his heartbeat.  Of note, the pharmacist told him to be careful with that medication when he picked it up and he wonders if some of it could have been anxiousness surrounding the medicine.  Prior note: with complaints of increase frequency in headache. He states he started to have frequent headaches in high school. He has never had a work up for his headaches.  He reports he used to take ibuprofen frequently for his headaches, and he can no longer take ibuprofen secondary to his ulcerative colitis.  He endorses having a headache almost every day when he wakes up that can be anywhere on his head.  Most frequently top of his forehead area.   He states he also has migraines about every other month.  When he has a migraine he has to lay  down usually for the rest of the day.  He endorses nausea and photophobia with his migraines.  He has never tried migraine medicine in the past. Patient reports he does snore.  He has woken himself up snoring.  He is uncertain if he has had any apneic spells.  History of ulcerative colitis, with low iron in November 2023 improving iron saturations and CBC as of 10/2022.  TSH within normal limits 11/29/2022     07/14/2023   10:53 AM 03/24/2023   10:03 AM 02/16/2023   10:59 AM 11/29/2022    8:20 AM 11/29/2022    8:09 AM  Depression screen PHQ 2/9  Decreased Interest 0 0 0 1 0  Down, Depressed, Hopeless 0 0 0 1 0  PHQ - 2 Score 0 0 0 2 0  Altered sleeping    3   Tired, decreased energy    3   Change in appetite    2   Feeling bad or failure about yourself     0   Trouble concentrating    0   Moving slowly or fidgety/restless    0   Suicidal thoughts    0   PHQ-9 Score    10     No Known Allergies Social History   Social History Narrative   Marital status/children/pets: Single.    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    Palpitations 10/30/2015   pt reports palpitaitons and chest discomfort. EKG (reported LVH) and then NORMAL echo completed.    SIRS (systemic inflammatory response syndrome) (HCC) 12/15/2021   Ulcerative colitis [facility])    Past Surgical History:  Procedure Laterality Date   BIOPSY  12/17/2021   Procedure: BIOPSY;  Surgeon: Beverley Fiedler, MD;  Location: [facility];  Service: Gastroenterology;;  EGD and COLON   COLONOSCOPY N/A 12/17/2021   Procedure: COLONOSCOPY;  Surgeon: Beverley Fiedler, MD;  Location: [facility];  Service: Gastroenterology;   Laterality: N/A;   ESOPHAGOGASTRODUODENOSCOPY (EGD) WITH PROPOFOL N/A 12/17/2021   Procedure: ESOPHAGOGASTRODUODENOSCOPY (EGD) WITH PROPOFOL;  Surgeon: Beverley Fiedler, MD;  Location: [facility];  Service: Gastroenterology;  Laterality: N/A;   NO PAST SURGERIES     Family History  Problem Relation Age of Onset   Hyperlipidemia Father    Arthritis Maternal Grandmother    Diabetes Maternal Grandmother    Hyperlipidemia Maternal Grandmother    Hypertension Maternal Grandmother    Hypertension Maternal Grandfather    Hyperlipidemia Maternal Grandfather    Prostate cancer Maternal Grandfather    Cervical cancer Paternal Grandmother    Hypertension Paternal Grandfather    Heart disease Paternal Grandfather    Colon cancer Neg Hx    Esophageal cancer Neg Hx    Rectal cancer Neg Hx    Stomach cancer Neg Hx    Allergies as of 08/24/2023   No Known Allergies      Medication List        Accurate as of August 24, 2023  2:17 PM. If you have any questions, ask your nurse or doctor.          b complex vitamins capsule Take 1 capsule by mouth daily. Started by: Felix Pacini   ketoconazole 2 % cream Commonly known as: NIZORAL Apply 1 Application topically 2 (two) times daily.   mesalamine 1000 MG suppository Commonly known as: Canasa Place 1 suppository (1,000 mg total) rectally at bedtime for 14 days. Use suppository rectally at bedtime for 14 days then, use intermittently (not continuously) if symptoms occur.   rizatriptan 5 MG tablet Commonly known as: MAXALT Take 1 tablet (5 mg total) by mouth as needed for migraine. May repeat in 2 hours if needed   vedolizumab 300 MG injection Commonly known as: ENTYVIO Inject into the vein.   Vitamin D3 125 MCG (5000 UT) Caps Take 1 capsule (5,000 Units total) by mouth daily. Started by: Felix Pacini        All past medical history, surgical history, allergies, family history, immunizations andmedications were updated in the EMR  today and reviewed under the history and medication portions of their EMR.     ROS Negative, with the exception of above mentioned in HPI   Objective:  BP 122/82   Pulse 95   Temp 98.4 F (36.9 C)   Wt 220 lb (99.8 kg)   SpO2 97%   BMI 38.97 kg/m  Body mass index is 38.97 kg/m. Physical Exam Vitals and nursing note reviewed.  Constitutional:      General: He is not in acute distress.    Appearance: Normal appearance. He is not ill-appearing, toxic-appearing or diaphoretic.  HENT:     Head: Normocephalic and atraumatic.  Eyes:     General: No scleral icterus.       Right eye: No discharge.        Left eye: No discharge.     Extraocular Movements: Extraocular movements intact.     Pupils: Pupils are equal, round, and reactive to light.  Skin:    General: Skin is warm and dry.     Coloration: Skin is not jaundiced or pale.     Findings: No rash.  Neurological:     Mental Status: He is alert and oriented to person, place, and time. Mental status is at baseline.  Psychiatric:        Mood and Affect: Mood normal.        Behavior: Behavior normal.        Thought Content: Thought content normal.        Judgment: Judgment normal.      No results found. No results found. No results found for this or any previous visit (from the past 24 hours).  Assessment/Plan: Tyjai Matuszak is a 23 y.o. male present for OV for  Chronic headaches/migraine We discussed the large differential of his reported daily headaches.  He seems to wake up with these headaches frequently.  We discussed sinus causes, need of image of brain and neurology referrals, OSA eval etc.  > he would like to wait on any referrals or imaging for now. - Vitamin D (25 hydroxy)-level of 16>remain deficient> never started d3 125 mcg> prescribed again and explained if not "covered" by insurance to purchase PTC - B12 and Folate Panel-B12 deficient, folate normal> never started vit> start B-complex.  - Magnesium-within  normal limits - PTH, Intact and Calcium-within normal limits Continue Maxalt 5 mg at onset of headache prescribed, may repeat once in 2 hours if headache remains.  Worked well for him     Reviewed expectations re: course of current medical issues. Discussed self-management of symptoms. Outlined signs and symptoms indicating need for more acute intervention. Patient verbalized understanding and all questions were answered. Patient received an After-Visit Summary.    No orders of the defined types were placed in this encounter.  Meds ordered this encounter  Medications   rizatriptan (MAXALT) 5 MG tablet    Sig: Take 1 tablet (5 mg total) by mouth as needed for migraine. May repeat in 2 hours if needed    Dispense:  10 tablet    Refill:  11   Cholecalciferol (VITAMIN D3) 125 MCG (5000 UT) CAPS    Sig: Take 1 capsule (5,000 Units total) by mouth daily.    Dispense:  30 capsule    Refill:  11   b complex vitamins capsule    Sig: Take 1 capsule by mouth daily.    Dispense:  90 capsule    Refill:  3   Referral Orders  No referral(s) requested today      Note is dictated utilizing voice recognition software. Although note has been proof read prior to signing, occasional typographical errors still can be missed. If any questions arise, please do not hesitate to call for verification.   electronically signed by:  Felix Pacini, DO  [facility] - [place]

## 2023-09-12 ENCOUNTER — Ambulatory Visit (INDEPENDENT_AMBULATORY_CARE_PROVIDER_SITE_OTHER): Payer: 59

## 2023-09-12 VITALS — BP 133/89 | HR 80 | Temp 98.2°F | Resp 20 | Ht 63.0 in | Wt 217.0 lb

## 2023-09-12 DIAGNOSIS — K51919 Ulcerative colitis, unspecified with unspecified complications: Secondary | ICD-10-CM

## 2023-09-12 DIAGNOSIS — K921 Melena: Secondary | ICD-10-CM | POA: Diagnosis not present

## 2023-09-12 MED ORDER — VEDOLIZUMAB 300 MG IV SOLR
300.0000 mg | Freq: Once | INTRAVENOUS | Status: AC
  Administered 2023-09-12: 300 mg via INTRAVENOUS
  Filled 2023-09-12: qty 5

## 2023-09-12 NOTE — Progress Notes (Signed)
 Diagnosis:  Hematochezia    Provider:  Praveen Mannam MD  Procedure: IV Infusion  IV Type: Peripheral, IV Location: R Forearm  Entyvio  (Vedolizumab ), Dose: 300 mg  Infusion Start Time: 1122  Infusion Stop Time: 1157  Post Infusion IV Care: Peripheral IV Discontinued  Discharge: Condition: Good, Destination: Home . AVS Declined  Performed by:  Langston Summerfield, RN

## 2023-10-28 ENCOUNTER — Other Ambulatory Visit: Payer: Self-pay

## 2023-10-28 DIAGNOSIS — D500 Iron deficiency anemia secondary to blood loss (chronic): Secondary | ICD-10-CM

## 2023-10-31 ENCOUNTER — Inpatient Hospital Stay: Payer: 59 | Attending: Adult Health

## 2023-10-31 DIAGNOSIS — Z8049 Family history of malignant neoplasm of other genital organs: Secondary | ICD-10-CM | POA: Insufficient documentation

## 2023-10-31 DIAGNOSIS — Z8249 Family history of ischemic heart disease and other diseases of the circulatory system: Secondary | ICD-10-CM | POA: Diagnosis not present

## 2023-10-31 DIAGNOSIS — Z83438 Family history of other disorder of lipoprotein metabolism and other lipidemia: Secondary | ICD-10-CM | POA: Diagnosis not present

## 2023-10-31 DIAGNOSIS — Z8261 Family history of arthritis: Secondary | ICD-10-CM | POA: Diagnosis not present

## 2023-10-31 DIAGNOSIS — D509 Iron deficiency anemia, unspecified: Secondary | ICD-10-CM | POA: Insufficient documentation

## 2023-10-31 DIAGNOSIS — Z833 Family history of diabetes mellitus: Secondary | ICD-10-CM | POA: Insufficient documentation

## 2023-10-31 DIAGNOSIS — E538 Deficiency of other specified B group vitamins: Secondary | ICD-10-CM | POA: Diagnosis not present

## 2023-10-31 DIAGNOSIS — D500 Iron deficiency anemia secondary to blood loss (chronic): Secondary | ICD-10-CM

## 2023-10-31 DIAGNOSIS — Z79899 Other long term (current) drug therapy: Secondary | ICD-10-CM | POA: Diagnosis not present

## 2023-10-31 DIAGNOSIS — E781 Pure hyperglyceridemia: Secondary | ICD-10-CM | POA: Diagnosis not present

## 2023-10-31 DIAGNOSIS — Z8042 Family history of malignant neoplasm of prostate: Secondary | ICD-10-CM | POA: Diagnosis not present

## 2023-10-31 LAB — CBC WITH DIFFERENTIAL (CANCER CENTER ONLY)
Abs Immature Granulocytes: 0.02 10*3/uL (ref 0.00–0.07)
Basophils Absolute: 0 10*3/uL (ref 0.0–0.1)
Basophils Relative: 0 %
Eosinophils Absolute: 0.2 10*3/uL (ref 0.0–0.5)
Eosinophils Relative: 3 %
HCT: 40.7 % (ref 39.0–52.0)
Hemoglobin: 15.1 g/dL (ref 13.0–17.0)
Immature Granulocytes: 0 %
Lymphocytes Relative: 42 %
Lymphs Abs: 3.3 10*3/uL (ref 0.7–4.0)
MCH: 32.3 pg (ref 26.0–34.0)
MCHC: 37.1 g/dL — ABNORMAL HIGH (ref 30.0–36.0)
MCV: 87 fL (ref 80.0–100.0)
Monocytes Absolute: 0.5 10*3/uL (ref 0.1–1.0)
Monocytes Relative: 6 %
Neutro Abs: 3.9 10*3/uL (ref 1.7–7.7)
Neutrophils Relative %: 49 %
Platelet Count: 295 10*3/uL (ref 150–400)
RBC: 4.68 MIL/uL (ref 4.22–5.81)
RDW: 12.2 % (ref 11.5–15.5)
WBC Count: 7.9 10*3/uL (ref 4.0–10.5)
nRBC: 0 % (ref 0.0–0.2)

## 2023-10-31 LAB — CMP (CANCER CENTER ONLY)
ALT: 16 U/L (ref 0–44)
AST: 16 U/L (ref 15–41)
Albumin: 4.5 g/dL (ref 3.5–5.0)
Alkaline Phosphatase: 58 U/L (ref 38–126)
Anion gap: 8 (ref 5–15)
BUN: 12 mg/dL (ref 6–20)
CO2: 23 mmol/L (ref 22–32)
Calcium: 9.3 mg/dL (ref 8.9–10.3)
Chloride: 107 mmol/L (ref 98–111)
Creatinine: 0.76 mg/dL (ref 0.61–1.24)
GFR, Estimated: >60 mL/min (ref >60)
Glucose, Bld: 102 mg/dL — ABNORMAL HIGH (ref 70–99)
Potassium: 3.9 mmol/L (ref 3.5–5.1)
Sodium: 138 mmol/L (ref 135–145)
Total Bilirubin: 0.4 mg/dL (ref 0.0–1.2)
Total Protein: 7.4 g/dL (ref 6.5–8.1)

## 2023-10-31 LAB — IRON AND IRON BINDING CAPACITY (CC-WL,HP ONLY)
Iron: 62 ug/dL (ref 45–182)
Saturation Ratios: 17 % — ABNORMAL LOW (ref 17.9–39.5)
TIBC: 357 ug/dL (ref 250–450)
UIBC: 295 ug/dL (ref 117–376)

## 2023-10-31 LAB — FERRITIN: Ferritin: 74 ng/mL (ref 24–336)

## 2023-11-02 ENCOUNTER — Ambulatory Visit: Payer: Self-pay | Admitting: Hematology and Oncology

## 2023-11-04 ENCOUNTER — Inpatient Hospital Stay (HOSPITAL_BASED_OUTPATIENT_CLINIC_OR_DEPARTMENT_OTHER): Payer: 59 | Admitting: Adult Health

## 2023-11-04 DIAGNOSIS — D500 Iron deficiency anemia secondary to blood loss (chronic): Secondary | ICD-10-CM

## 2023-11-04 NOTE — Progress Notes (Signed)
 [facility] Cancer Follow up:    Mariel Shope, DO [address] [place] [address]  I connected with Willie James on 11/04/23 at  8:40 AM EDT by telephone and verified that I am speaking with the correct person using two identifiers.  I discussed the limitations, risks, security and privacy concerns of performing an evaluation and management service by telephone and the availability of in person appointments.  I also discussed with the patient that there may be a patient responsible charge related to this service. The patient expressed understanding and agreed to proceed.  Patient location: home Provider location: [facility] office   DIAGNOSIS: IRON  DEFICIENCY ANEMIA    SUMMARY OF HEMATOLOGIC HISTORY: History of ulcerative colitis diagnosed in 11/2021 02/10/2022 diagnosed with Iron  deficiency anemia with ferritin of 9, saturation 4.8% Receives IV iron  intermittently; 12/2021, 03/2022, 07/2022  CURRENT THERAPY: Intermittent IV iron   INTERVAL HISTORY: Willie James is here for f/u of his iron  deficiency anemia. He tells me that he is doing well.  He denies any blood in his stool or black tarry stool.  He denies any recent ulcerative colitis flares.  We are following his ferritin, iron  studies and hemoglobin noted below.   Latest Reference Range & Units 11/02/21 15:43 11/13/21 14:44 12/09/21 16:37 12/16/21 05:21 02/10/22 15:17 04/20/22 13:27 07/23/22 13:12 11/01/22 13:44 01/31/23 13:48 01/31/23 13:49 04/25/23 09:57 04/25/23 09:58 10/31/23 09:16 10/31/23 09:17  Iron  45 - 182 ug/dL 50 27 (L) 11 (L)  18 (L) 35 (L) 18 (L) 56 57  89  62   UIBC 117 - 376 ug/dL  295    188 (H) 416 (H) 311 315  285  295   TIBC 250 - 450 ug/dL 606 301 601.0  932.3 557 (H) 472 (H) 367 372  374  357   %SAT 20 - 48 % (calc) 17 (L)               Saturation Ratios 17.9 - 39.5 %  11 (L) 3.6 (L)  4.8 (L) 7 (L) 4 (L) 15 (L) 15 (L)  24  17 (L)   Ferritin 24 - 336 ng/mL 82  25.6  9.1 (L) 46 5 (L) 28  26  39  74   Hemoglobin 13.0 - 17.0 g/dL 32.2  9.8 Repeated and verified X2. (L) 6.6 (LL) 8.1 Repeated and verified X2. (L) 12.2 (L) 11.3 (L) 15.3 15.1  16.8  15.1   (LL): Data is critically low (L): Data is abnormally low (H): Data is abnormally high  Patient Active Problem List   Diagnosis Date Noted   Increased frequency of headaches 03/17/2023   B12 deficiency 03/17/2023   Vitamin D  deficiency 11/30/2022   Acute esophagitis    Ulcerative colitis (HCC) 12/15/2021   IDA (iron  deficiency anemia) 12/10/2021   Hypertriglyceridemia 01/07/2020   Obesity (BMI 30-39.9) 01/03/2018   Palpitations 01/03/2018    has no known allergies.  MEDICAL HISTORY: Past Medical History:  Diagnosis Date   Frequent headaches    Left ventricular hypertrophy by electrocardiogram 10/28/2015   echo after was normal.    Palpitations 10/30/2015   pt reports palpitaitons and chest discomfort. EKG (reported LVH) and then NORMAL echo completed.    SIRS (systemic inflammatory response syndrome) (HCC) 12/15/2021   Ulcerative colitis (HCC)     SURGICAL HISTORY: Past Surgical History:  Procedure Laterality Date   BIOPSY  12/17/2021   Procedure: BIOPSY;  Surgeon: Nannette Babe, MD;  Location: [facility];  Service:  Gastroenterology;;  EGD and COLON   COLONOSCOPY N/A 12/17/2021   Procedure: COLONOSCOPY;  Surgeon: Nannette Babe, MD;  Location: [facility];  Service: Gastroenterology;  Laterality: N/A;   ESOPHAGOGASTRODUODENOSCOPY (EGD) WITH PROPOFOL  N/A 12/17/2021   Procedure: ESOPHAGOGASTRODUODENOSCOPY (EGD) WITH PROPOFOL ;  Surgeon: Nannette Babe, MD;  Location: [facility];  Service: Gastroenterology;  Laterality: N/A;   NO PAST SURGERIES      SOCIAL HISTORY: Social History   Socioeconomic History   Marital status: Single    Spouse name: Not on file   Number of children: Not on file   Years of education: Not on file   Highest education level: Some college, no degree  Occupational History   Not on file   Tobacco Use   Smoking status: Never    Passive exposure: Never   Smokeless tobacco: Never  Vaping Use   Vaping status: Never Used  Substance and Sexual Activity   Alcohol use: Never   Drug use: Never   Sexual activity: Never  Other Topics Concern   Not on file  Social History Narrative   Marital status/children/pets: Single.    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[employer])   Safety:      -Wears a bicycle helmet riding a bike: Yes     -smoke alarm in the home:Yes     - wears seatbelt: Yes     - Feels safe in their relationships: Yes   Social Drivers of Corporate investment banker Strain: Low Risk  (03/17/2023)   Overall Financial Resource Strain (CARDIA)    Difficulty of Paying Living Expenses: Not hard at all  Food Insecurity: No Food Insecurity (03/17/2023)   Hunger Vital Sign    Worried About Running Out of Food in the Last Year: Never true    Ran Out of Food in the Last Year: Never true  Transportation Needs: No Transportation Needs (03/17/2023)   PRAPARE - Administrator, Civil Service (Medical): No    Lack of Transportation (Non-Medical): No  Physical Activity: Insufficiently Active (03/17/2023)   Exercise Vital Sign    Days of Exercise per Week: 3 days    Minutes of Exercise per Session: 40 min  Stress: No Stress Concern Present (03/17/2023)   [employer] of Occupational Health - Occupational Stress Questionnaire    Feeling of Stress : Not at all  Social Connections: Unknown (03/17/2023)   Social Connection and Isolation Panel    Frequency of Communication with Friends and Family: More than three times a week    Frequency of Social Gatherings with Friends and Family: More than three times a week    Attends Religious Services: Never    Database administrator or Organizations: No    Attends Engineer, structural: Not on file    Marital Status: Patient declined  Recent Concern: Social Connections - Socially Isolated (02/15/2023)   Social  Connection and Isolation Panel    Frequency of Communication with Friends and Family: More than three times a week    Frequency of Social Gatherings with Friends and Family: More than three times a week    Attends Religious Services: Never    Database administrator or Organizations: No    Attends Engineer, structural: Not on file    Marital Status: Never married  Intimate Partner Violence: Not on file    FAMILY HISTORY: Family History  Problem Relation Age of Onset   Hyperlipidemia Father    Arthritis Maternal Grandmother  Diabetes Maternal Grandmother    Hyperlipidemia Maternal Grandmother    Hypertension Maternal Grandmother    Hypertension Maternal Grandfather    Hyperlipidemia Maternal Grandfather    Prostate cancer Maternal Grandfather    Cervical cancer Paternal Grandmother    Hypertension Paternal Grandfather    Heart disease Paternal Grandfather    Colon cancer Neg Hx    Esophageal cancer Neg Hx    Rectal cancer Neg Hx    Stomach cancer Neg Hx     Review of Systems  Constitutional:  Negative for appetite change, chills, fatigue, fever and unexpected weight change.  HENT:   Negative for hearing loss, lump/mass and trouble swallowing.   Eyes:  Negative for eye problems and icterus.  Respiratory:  Negative for chest tightness, cough and shortness of breath.   Cardiovascular:  Negative for chest pain, leg swelling and palpitations.  Gastrointestinal:  Negative for abdominal distention, abdominal pain, constipation, diarrhea, nausea and vomiting.  Endocrine: Negative for hot flashes.  Genitourinary:  Negative for difficulty urinating.   Musculoskeletal:  Negative for arthralgias.  Skin:  Negative for itching and rash.  Neurological:  Negative for dizziness, extremity weakness, headaches and numbness.  Hematological:  Negative for adenopathy. Does not bruise/bleed easily.  Psychiatric/Behavioral:  Negative for depression. The patient is not nervous/anxious.        PHYSICAL EXAMINATION Patient sounds well.  In no apparent distress.  Mood and behavior normal.  Speech is normal.      ASSESSMENT and THERAPY PLAN:   IDA (iron  deficiency anemia) Willie James is a 23 year old male with iron  deficiency anemia related to his history of ulcerative colitis.  Iron  levels stable and no UC flares.  Will continue to monitor with labs every 6 months. Reviewed labs in detail with patient.   Continue to see Dr. Karene Oto in GI.    RTC in 6 months   The patient was provided an opportunity to ask questions and all were answered. The patient agreed with the plan and demonstrated an understanding of the instructions.   The patient was advised to call back or seek an in-person evaluation if the symptoms worsen or if the condition fails to improve as anticipated.   I provided 10 minutes of non face-to-face telephone visit time during this encounter, and > 50% was spent counseling as documented under my assessment & plan.    Alwin Baars, NP 11/04/23 9:07 AM Medical Oncology and Hematology [facility] [address] [place], [place] [address] Tel. [phone]    Fax. [phone]  *Total Encounter Time as defined by the Centers for Medicare and Medicaid Services includes, in addition to the face-to-face time of a patient visit (documented in the note above) non-face-to-face time: obtaining and reviewing outside history, ordering and reviewing medications, tests or procedures, care coordination (communications with other health care professionals or caregivers) and documentation in the medical record.

## 2023-11-04 NOTE — Assessment & Plan Note (Signed)
 Willie James is a 23 year old male with iron  deficiency anemia related to his history of ulcerative colitis.  Iron  levels stable and no UC flares.  Will continue to monitor with labs every 6 months. Reviewed labs in detail with patient.   Continue to see Dr. Karene Oto in GI.    RTC in 6 months

## 2023-11-07 ENCOUNTER — Ambulatory Visit

## 2023-11-07 VITALS — BP 125/81 | HR 95 | Temp 98.1°F | Resp 18 | Ht 63.0 in | Wt 219.0 lb

## 2023-11-07 DIAGNOSIS — K921 Melena: Secondary | ICD-10-CM | POA: Diagnosis not present

## 2023-11-07 DIAGNOSIS — K51919 Ulcerative colitis, unspecified with unspecified complications: Secondary | ICD-10-CM

## 2023-11-07 MED ORDER — VEDOLIZUMAB 300 MG IV SOLR
300.0000 mg | Freq: Once | INTRAVENOUS | Status: AC
  Administered 2023-11-07: 300 mg via INTRAVENOUS
  Filled 2023-11-07: qty 5

## 2023-11-07 NOTE — Progress Notes (Signed)
 Diagnosis: Ulcerative Colitis  Provider:  Praveen Mannam MD  Procedure: IV Infusion  IV Type: Peripheral, IV Location: R Forearm  Entyvio  (Vedolizumab ), Dose: 300 mg  Infusion Start Time: 1128  Infusion Stop Time: 1159  Post Infusion IV Care: Peripheral IV Discontinued  Discharge: Condition: Good, Destination: Home . AVS Declined  Performed by:  Maurisio Ruddy, RN

## 2024-01-02 ENCOUNTER — Ambulatory Visit

## 2024-01-02 VITALS — BP 126/86 | HR 71 | Temp 98.2°F | Resp 16 | Ht 63.0 in | Wt 218.2 lb

## 2024-01-02 DIAGNOSIS — K51919 Ulcerative colitis, unspecified with unspecified complications: Secondary | ICD-10-CM | POA: Diagnosis not present

## 2024-01-02 DIAGNOSIS — K921 Melena: Secondary | ICD-10-CM | POA: Diagnosis not present

## 2024-01-02 MED ORDER — VEDOLIZUMAB 300 MG IV SOLR
300.0000 mg | Freq: Once | INTRAVENOUS | Status: AC
  Administered 2024-01-02 (×2): 300 mg via INTRAVENOUS
  Filled 2024-01-02: qty 5

## 2024-01-02 NOTE — Progress Notes (Signed)
 Diagnosis: Ulcerative Colitis  Provider:  Mannam, Praveen MD  Procedure: IV Infusion  IV Type: Peripheral, IV Location: R Forearm  Entyvio  (Vedolizumab ), Dose: 300 mg  Infusion Start Time: 1158  Infusion Stop Time: 1230  Post Infusion IV Care: Peripheral IV Discontinued  Discharge: Condition: Good, Destination: Home . AVS Declined  Performed by:  Rocky FORBES Sar, RN

## 2024-01-14 IMAGING — DX DG ABDOMEN 2V
2 series · 2 of 2 positions shown · non-contrast
Comparison: None Available.

CLINICAL DATA: Periumbilical pain.  Change in bowel habits

EXAM:
ABDOMEN - 2 VIEW

[abdomen erect]
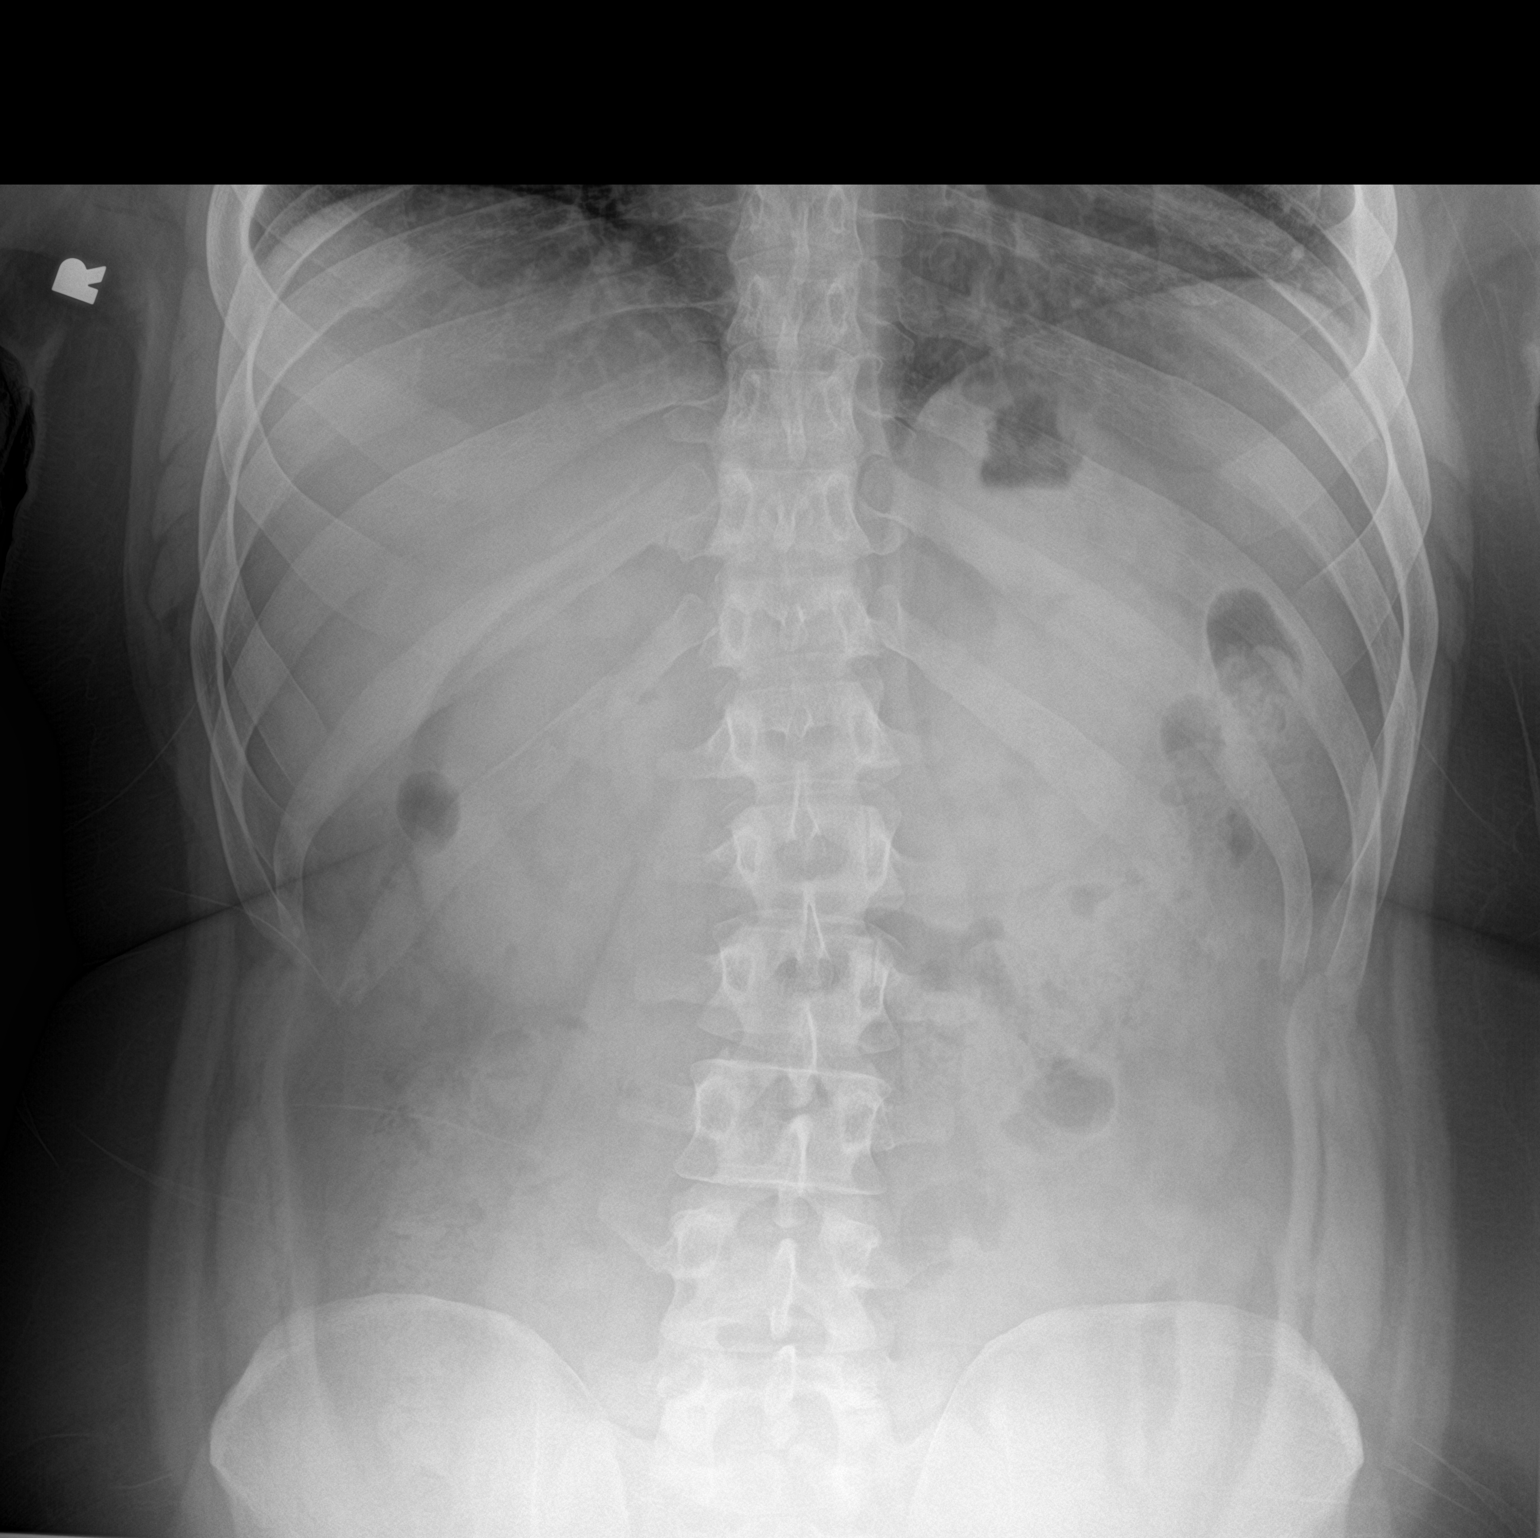

[abdomen supine]
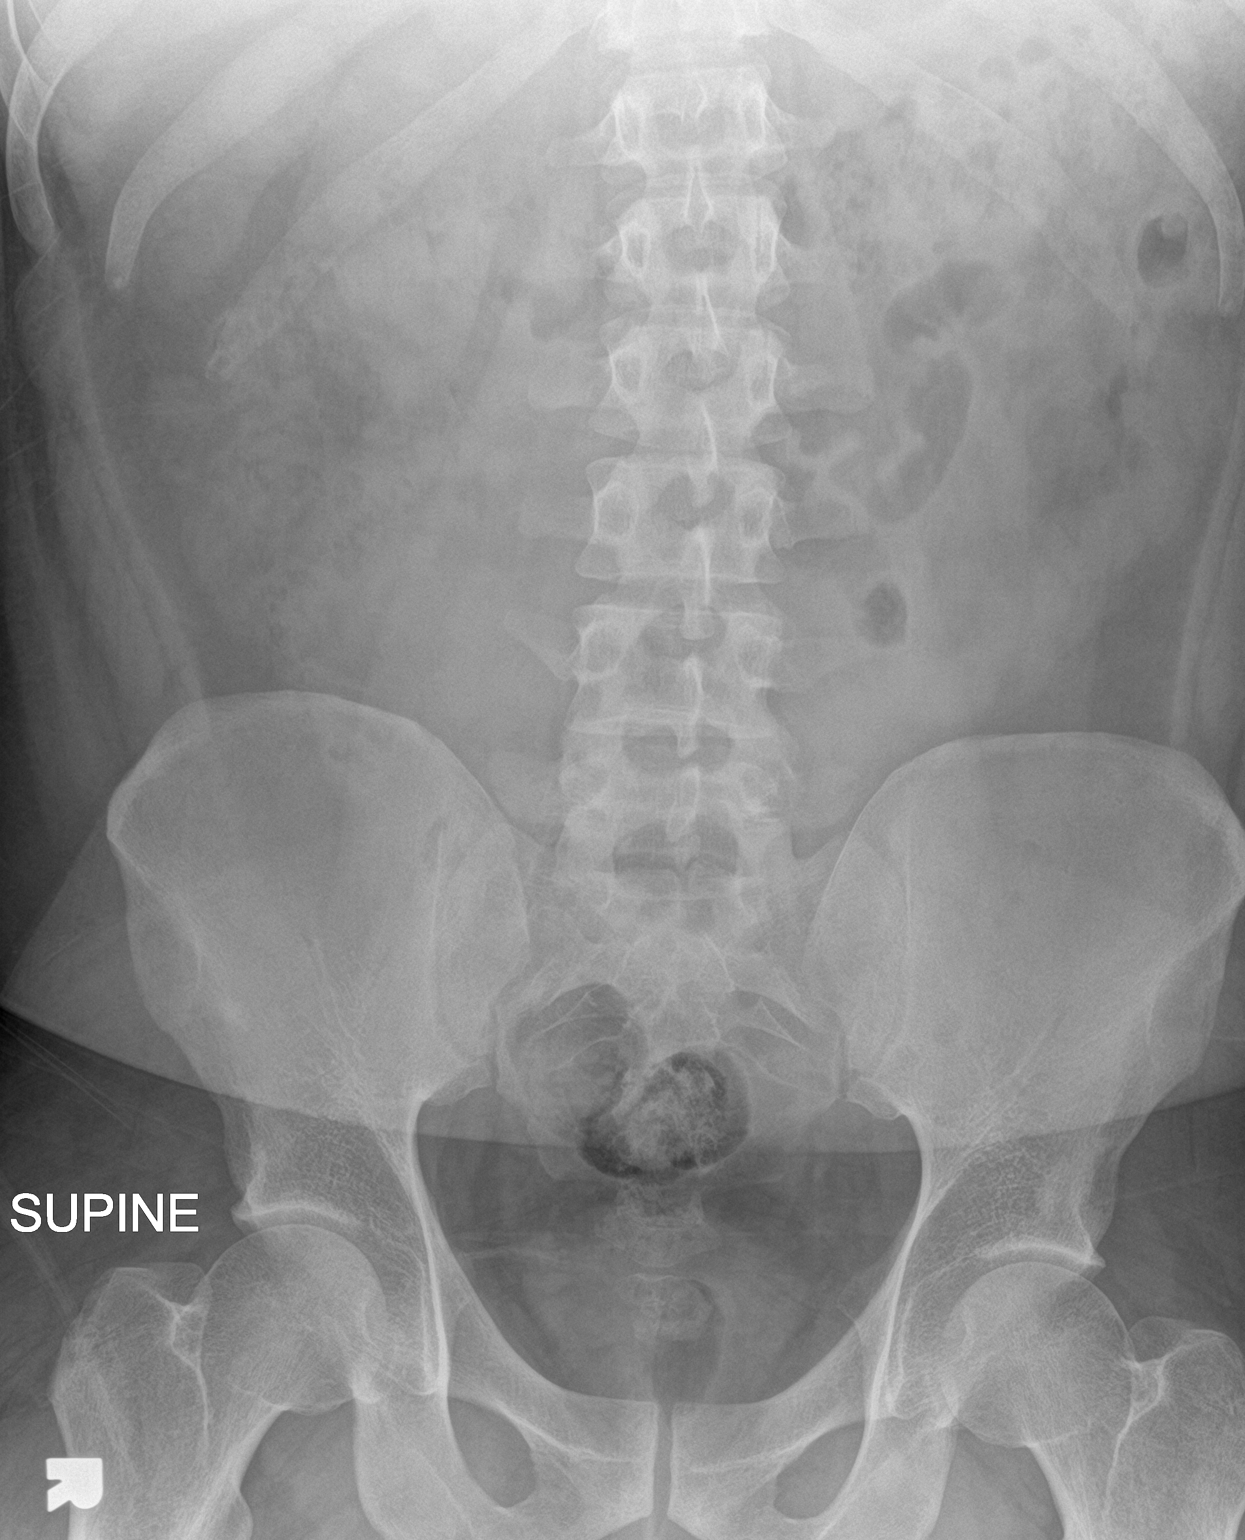

[2 of 2 positions shown; findings below may reference images not displayed]

FINDINGS: No free intra-abdominal air. No bowel dilatation or evidence of
obstruction. Normal bowel gas pattern. Moderate stool in the
ascending and transverse colon, small volume of stool distally. No
radiopaque calculi or abnormal soft tissue calcifications. No
concerning intraabdominal mass effect. Lung bases are clear.
Unremarkable osseous structures.
IMPRESSION: Normal bowel gas pattern. Moderate stool in the ascending and
transverse colon.

## 2024-02-07 IMAGING — CT CT ABD-PELV W/ CM
2 of 4 series · 16 of 46 positions shown, 18 images · IV contrast (APPLIED)
Comparison: Abdominal x-ray 10/20/2021

CLINICAL DATA: Nausea and vomiting with abdominal pain.

EXAM:
CT ABDOMEN AND PELVIS WITH CONTRAST
TECHNIQUE: Multidetector CT imaging of the abdomen and pelvis was performed
using the standard protocol following bolus administration of
intravenous contrast.

[Series 2: abd pel w · axial · 0.75mm/px · z∈[-413,+27]mm · 13 of 96 slices shown, 15 images]
[im 4/96  soft-tissue]
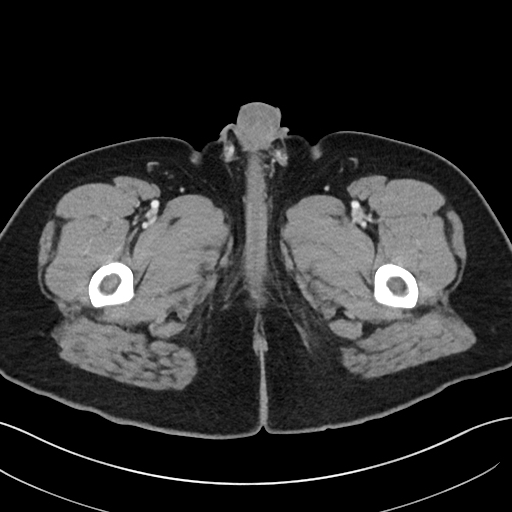
[im 4/96  bone]
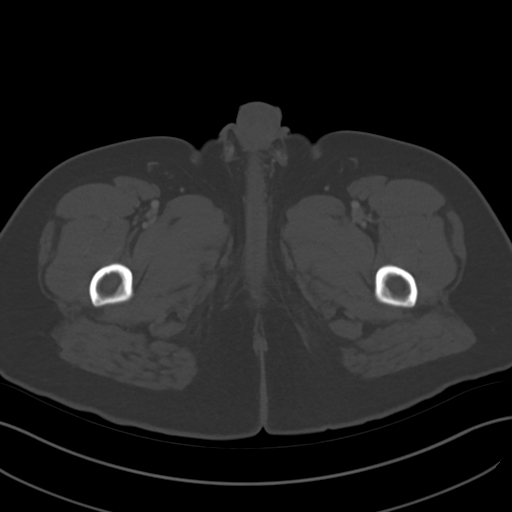
[im 12/96  soft-tissue]
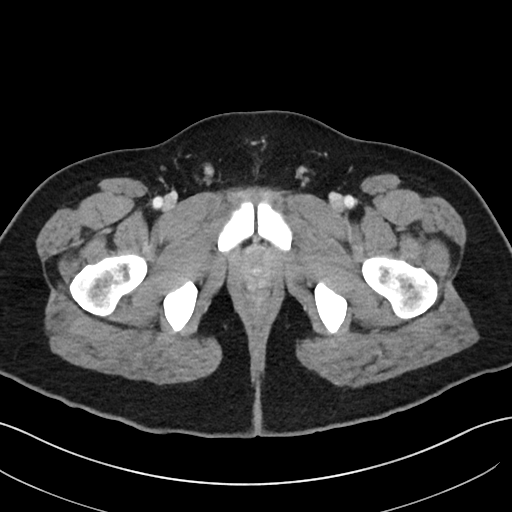
[im 20/96  soft-tissue]
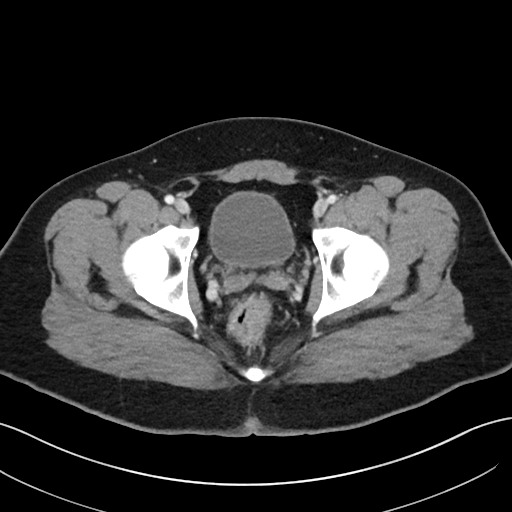
[im 27/96  soft-tissue]
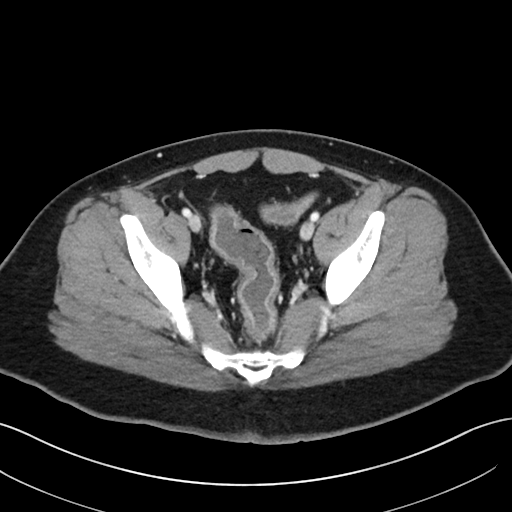
[im 35/96  soft-tissue]
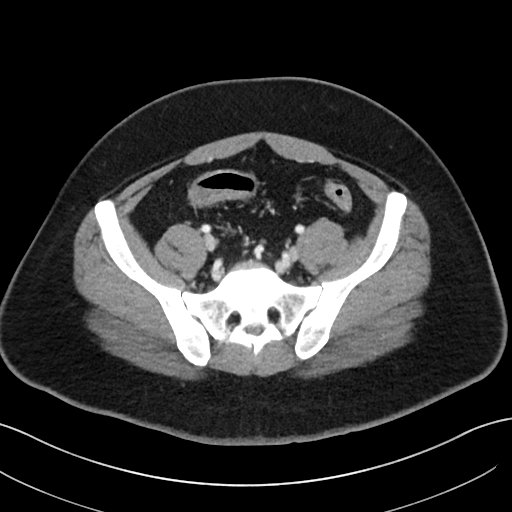
[im 42/96  soft-tissue]
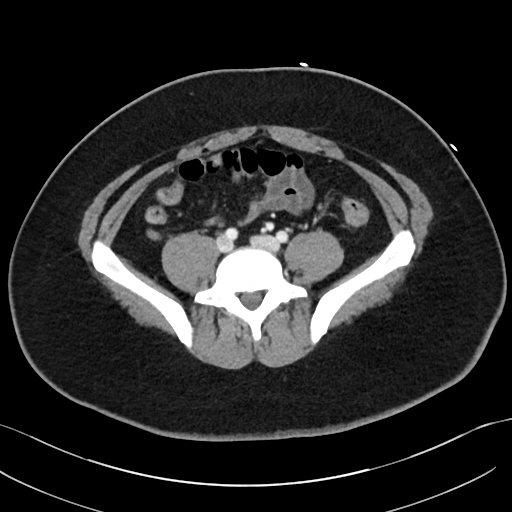
[im 50/96  soft-tissue]
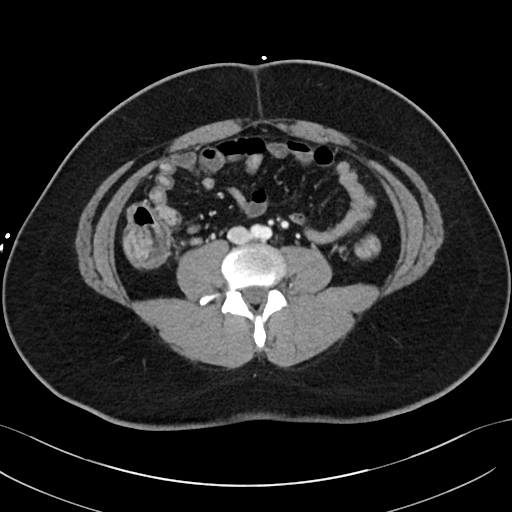
[im 54/96  soft-tissue]
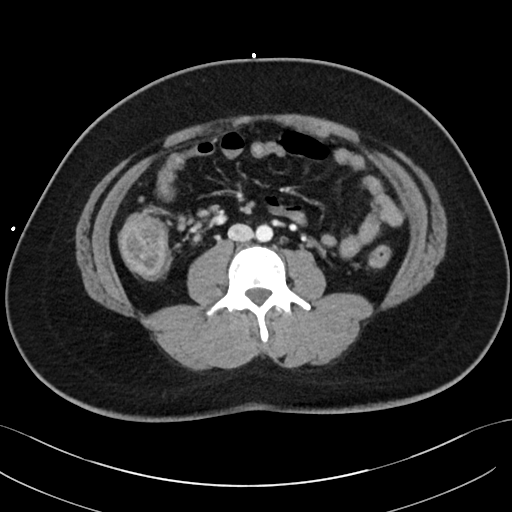
[im 61/96  soft-tissue]
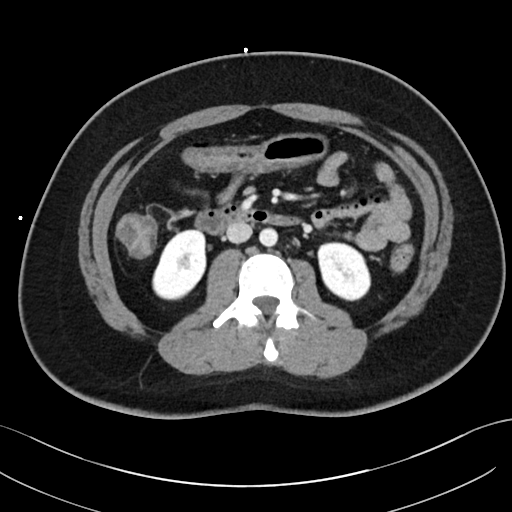
[im 61/96  bone]
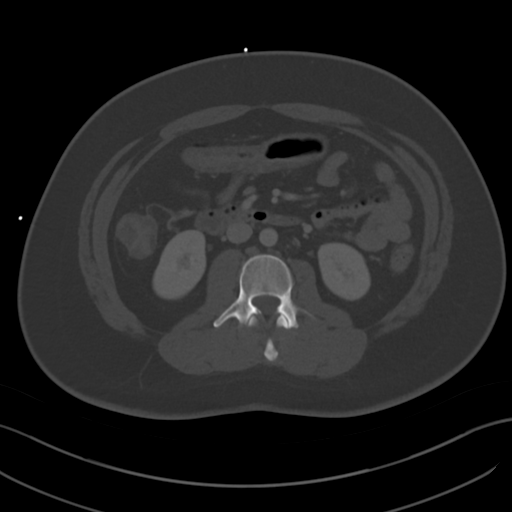
[im 69/96  soft-tissue]
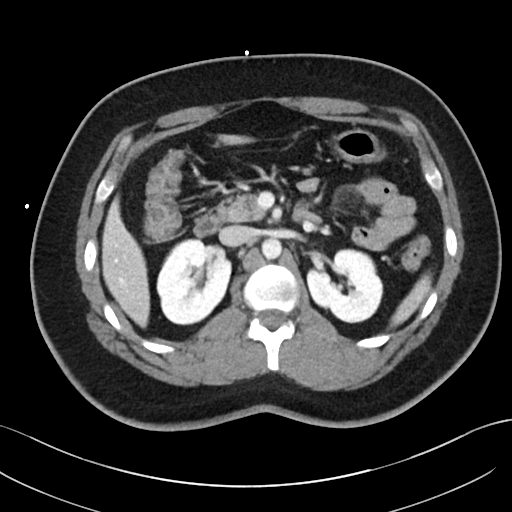
[im 77/96  soft-tissue]
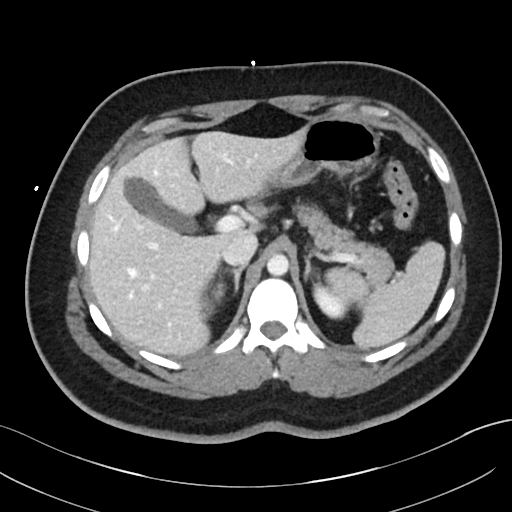
[im 84/96  soft-tissue]
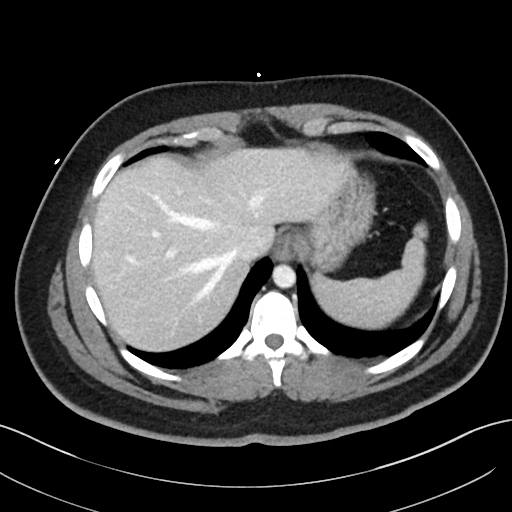
[im 92/96  soft-tissue]
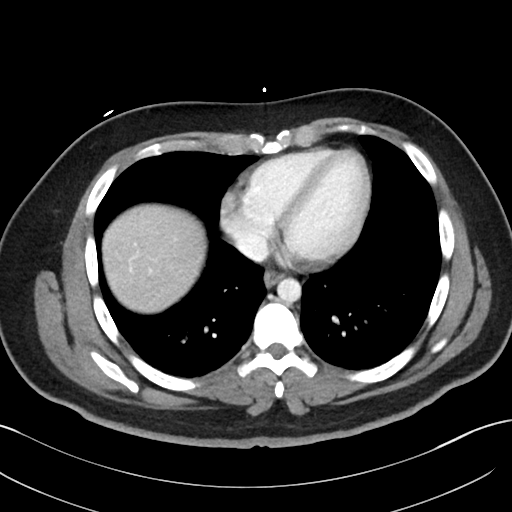

[Series 5: coronal · coronal · 0.74mm/px · 3 of 101 slices shown]
[im 34/101  soft-tissue]
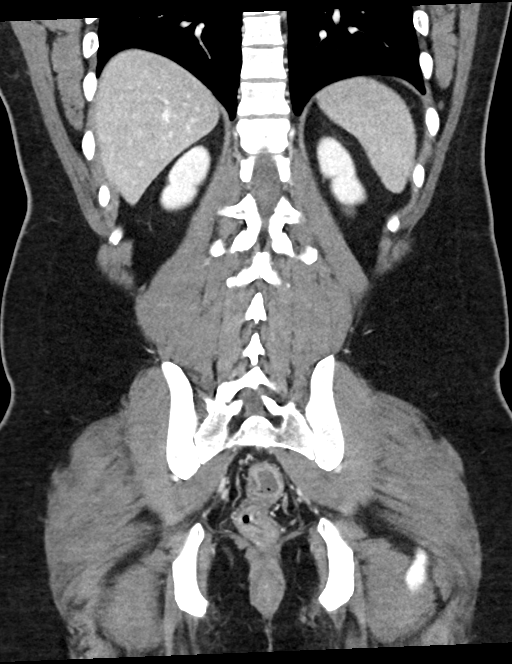
[im 45/101  soft-tissue]
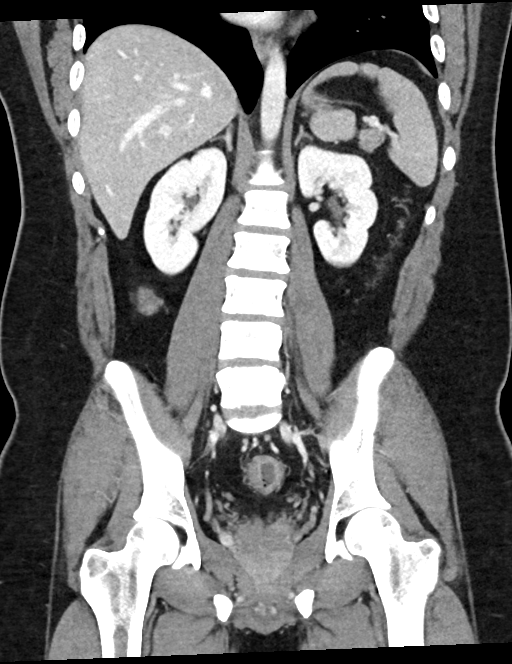
[im 56/101  soft-tissue]
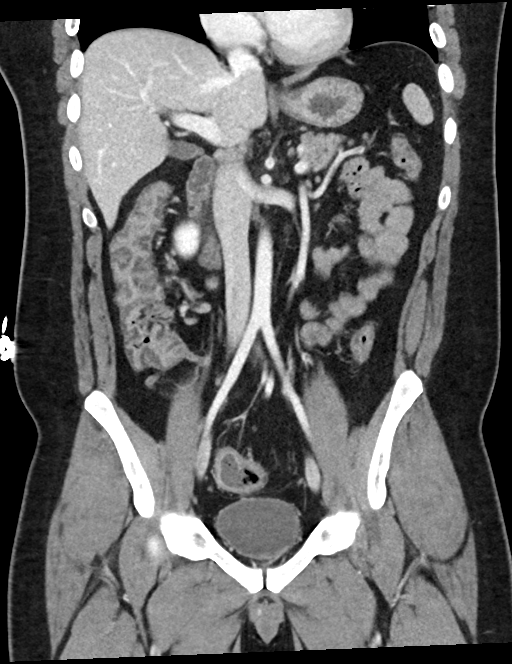

[16 of 46 positions shown; findings below may reference images not displayed]

RADIATION DOSE REDUCTION: This exam was performed according to the
departmental dose-optimization program which includes automated
exposure control, adjustment of the mA and/or kV according to
patient size and/or use of iterative reconstruction technique.

CONTRAST:  100mL OMNIPAQUE IOHEXOL 300 MG/ML  SOLN
FINDINGS: Lower chest: No acute abnormality.

Hepatobiliary: No focal liver abnormality is seen. No gallstones,
gallbladder wall thickening, or biliary dilatation.

Pancreas: Unremarkable. No pancreatic ductal dilatation or
surrounding inflammatory changes.

Spleen: Normal in size without focal abnormality.

Adrenals/Urinary Tract: Adrenal glands are unremarkable. Kidneys are
normal, without renal calculi, focal lesion, or hydronephrosis.
Bladder is unremarkable.

Stomach/Bowel: There is mild diffuse colonic wall thickening with
submucosal enhancement. No dilated bowel loops are seen. No free air
or pneumatosis. The appendix is within normal limits. Small bowel
and stomach are within normal limits.

Vascular/Lymphatic: No significant vascular findings are present. No
enlarged abdominal or pelvic lymph nodes.

Reproductive: Prostate is unremarkable.

Other: There is a small fat containing umbilical hernia. No ascites.

Musculoskeletal: No acute or significant osseous findings.
IMPRESSION: 1. Diffuse colonic wall thickening compatible with nonspecific
colitis. Findings are favored as infectious/inflammatory.

## 2024-02-27 ENCOUNTER — Ambulatory Visit (INDEPENDENT_AMBULATORY_CARE_PROVIDER_SITE_OTHER)

## 2024-02-27 VITALS — BP 130/85 | HR 79 | Temp 98.4°F | Resp 18 | Ht 63.0 in | Wt 214.6 lb

## 2024-02-27 DIAGNOSIS — K921 Melena: Secondary | ICD-10-CM

## 2024-02-27 DIAGNOSIS — K51919 Ulcerative colitis, unspecified with unspecified complications: Secondary | ICD-10-CM | POA: Diagnosis not present

## 2024-02-27 MED ORDER — VEDOLIZUMAB 300 MG IV SOLR
300.0000 mg | Freq: Once | INTRAVENOUS | Status: AC
  Administered 2024-02-27: 300 mg via INTRAVENOUS
  Filled 2024-02-27: qty 5

## 2024-02-27 NOTE — Progress Notes (Signed)
 Diagnosis: Ulcerative colitis   Provider:  Lonna Coder MD  Procedure: IV Infusion  IV Type: Peripheral, IV Location: R Upper Arm  Entyvio  (Vedolizumab ), Dose: 300 mg  Infusion Start Time: 1222  Infusion Stop Time: 1255  Post Infusion IV Care: Patient declined observation  Discharge: Condition: Good, Destination: Home . AVS Declined  Performed by:  Maximiano JONELLE Pouch, LPN

## 2024-04-23 ENCOUNTER — Encounter: Payer: Self-pay | Admitting: Family Medicine

## 2024-04-23 ENCOUNTER — Ambulatory Visit (INDEPENDENT_AMBULATORY_CARE_PROVIDER_SITE_OTHER): Admitting: Family Medicine

## 2024-04-23 ENCOUNTER — Ambulatory Visit (INDEPENDENT_AMBULATORY_CARE_PROVIDER_SITE_OTHER)

## 2024-04-23 VITALS — BP 130/80 | HR 85 | Temp 98.5°F | Ht 63.0 in | Wt 207.0 lb

## 2024-04-23 VITALS — BP 116/77 | HR 83 | Temp 98.6°F | Resp 16 | Ht 63.0 in | Wt 207.6 lb

## 2024-04-23 DIAGNOSIS — K921 Melena: Secondary | ICD-10-CM

## 2024-04-23 DIAGNOSIS — Z131 Encounter for screening for diabetes mellitus: Secondary | ICD-10-CM

## 2024-04-23 DIAGNOSIS — K51919 Ulcerative colitis, unspecified with unspecified complications: Secondary | ICD-10-CM

## 2024-04-23 DIAGNOSIS — Z0000 Encounter for general adult medical examination without abnormal findings: Secondary | ICD-10-CM

## 2024-04-23 DIAGNOSIS — E559 Vitamin D deficiency, unspecified: Secondary | ICD-10-CM | POA: Diagnosis not present

## 2024-04-23 DIAGNOSIS — Z23 Encounter for immunization: Secondary | ICD-10-CM

## 2024-04-23 DIAGNOSIS — Z1159 Encounter for screening for other viral diseases: Secondary | ICD-10-CM

## 2024-04-23 DIAGNOSIS — E669 Obesity, unspecified: Secondary | ICD-10-CM

## 2024-04-23 DIAGNOSIS — Z114 Encounter for screening for human immunodeficiency virus [HIV]: Secondary | ICD-10-CM

## 2024-04-23 DIAGNOSIS — D500 Iron deficiency anemia secondary to blood loss (chronic): Secondary | ICD-10-CM

## 2024-04-23 DIAGNOSIS — Z13220 Encounter for screening for lipoid disorders: Secondary | ICD-10-CM

## 2024-04-23 DIAGNOSIS — E781 Pure hyperglyceridemia: Secondary | ICD-10-CM

## 2024-04-23 DIAGNOSIS — Z118 Encounter for screening for other infectious and parasitic diseases: Secondary | ICD-10-CM

## 2024-04-23 LAB — CBC
HCT: 40.9 % (ref 39.0–52.0)
Hemoglobin: 14.3 g/dL (ref 13.0–17.0)
MCHC: 34.9 g/dL (ref 30.0–36.0)
MCV: 91.6 fl (ref 78.0–100.0)
Platelets: 354 K/uL (ref 150.0–400.0)
RBC: 4.47 Mil/uL (ref 4.22–5.81)
RDW: 14.7 % (ref 11.5–15.5)
WBC: 9.6 K/uL (ref 4.0–10.5)

## 2024-04-23 LAB — VITAMIN D 25 HYDROXY (VIT D DEFICIENCY, FRACTURES): VITD: 17.13 ng/mL — ABNORMAL LOW (ref 30.00–100.00)

## 2024-04-23 LAB — TSH: TSH: 1.89 u[IU]/mL (ref 0.35–5.50)

## 2024-04-23 LAB — B12 AND FOLATE PANEL
Folate: 8.7 ng/mL (ref >5.9)
Vitamin B-12: 256 pg/mL (ref 211–911)

## 2024-04-23 MED ORDER — VEDOLIZUMAB 300 MG IV SOLR
300.0000 mg | Freq: Once | INTRAVENOUS | Status: AC
  Administered 2024-04-23: 300 mg via INTRAVENOUS
  Filled 2024-04-23: qty 5

## 2024-04-23 NOTE — Progress Notes (Signed)
 Patient ID: Willie James, male  DOB: 2001/04/09, 23 y.o.   MRN: [id number] Patient Care Team    Relationship Specialty Notifications Start End  Catherine Charlies LABOR, DO PCP - General Family Medicine  11/13/21   [place] GAILS, DO Consulting Physician Gastroenterology  04/28/23     Chief Complaint  Patient presents with   Annual Exam    Pt is fasting chronic condition management.    Subjective: Willie James is a 23 y.o.  male  present for CPE and chronic condition management. All past medical history, surgical history, allergies, family history, immunizations, medications and social history were updated in the electronic medical record today. All recent labs, ED visits and hospitalizations within the last year were reviewed.   Health maintenance:  Colonoscopy: no fhx, personal h/o UC- followed by GI Immunizations: tdap UTD 2017, Influenza-declined (encouraged yearly)'Infectious disease screening: HIV, hepatitis C and urine cytology not indicated (never SA) Patient has a Dental home. Hospitalizations/ED visits: Reviewed     07/14/2023   10:53 AM 03/24/2023   10:03 AM 02/16/2023   10:59 AM 11/29/2022    8:20 AM 11/29/2022    8:09 AM  Depression screen PHQ 2/9  Decreased Interest 0 0 0 1 0  Down, Depressed, Hopeless 0 0 0 1 0  PHQ - 2 Score 0 0 0 2 0  Altered sleeping    3   Tired, decreased energy    3   Change in appetite    2   Feeling bad or failure about yourself     0   Trouble concentrating    0   Moving slowly or fidgety/restless    0   Suicidal thoughts    0   PHQ-9 Score    10       Data saved with a previous flowsheet row definition      11/29/2022    8:20 AM  GAD 7 : Generalized Anxiety Score  Nervous, Anxious, on Edge 0  Control/stop worrying 0  Worry too much - different things 0  Trouble relaxing 0  Restless 0  Easily annoyed or irritable 1  Afraid - awful might happen 0  Total GAD 7 Score 1  Anxiety Difficulty Somewhat difficult       Immunization History  Administered Date(s) Administered   DTaP 04/13/1999, 06/22/1999, 08/04/1999, 09/02/2000, 03/19/2004   HIB (PRP-OMP) 04/13/1999, 05/29/1999, 08/04/1999, 05/27/2000   HPV 9-valent 10/27/2015   HPV Bivalent 05/05/2002, 02/25/2012, 09/22/2012   HPV Quadrivalent 02/25/2012, 05/05/2012, 09/22/2012, 10/27/2015   Hepatitis A 02/07/2009, 08/15/2009   Hepatitis B 07/12/1998, 03/16/1999, 09/25/1999   Hepatitis B, PED/ADOLESCENT 24-Sep-1998, 03/16/1999, 09/25/1999   IPV 05/29/1999, 06/22/1999, 09/25/1999, 03/19/2004   Influenza Inj Mdck Quad Pf 01/19/2019   Influenza Split 03/14/2010, 04/08/2011, 03/17/2013   Influenza,inj,Quad PF,6+ Mos 06/04/2018, 01/24/2021   Influenza,inj,quad, With Preservative 04/09/2016   Influenza-Unspecified 04/27/2008, 02/07/2009, 03/14/2010, 04/08/2011, 03/17/2013, 04/09/2016, 02/21/2017, 06/04/2018, 01/19/2019, 02/22/2020   MMR 05/27/2000, 02/04/2003   Meningococcal Conjugate 01/11/2011, 10/27/2015   Meningococcal polysaccharide vaccine (MPSV4) 10/27/2015   Moderna Sars-Covid-2 Vaccination 08/29/2019, 09/29/2019, 05/16/2020   PPD Test 02/05/2000   Pneumococcal Conjugate-13 09/25/1999, 12/25/1999, 09/02/2000   Tdap 01/11/2011, 10/27/2015   Varicella 02/05/2000, 03/14/2010, 10/27/2015     Past Medical History:  Diagnosis Date   Frequent headaches    Left ventricular hypertrophy by electrocardiogram 10/28/2015   echo after was normal.    Palpitations 10/30/2015   pt reports palpitaitons and chest discomfort. EKG (reported LVH) and then NORMAL  echo completed.    SIRS (systemic inflammatory response syndrome) (HCC) 12/15/2021   Ulcerative colitis (HCC)    No Known Allergies Past Surgical History:  Procedure Laterality Date   BIOPSY  12/17/2021   Procedure: BIOPSY;  Surgeon: Albertus Gordy HERO, MD;  Location: [facility];  Service: Gastroenterology;;  EGD and COLON   COLONOSCOPY N/A 12/17/2021   Procedure: COLONOSCOPY;  Surgeon: Albertus Gordy HERO, MD;  Location: [facility];  Service: Gastroenterology;  Laterality: N/A;   ESOPHAGOGASTRODUODENOSCOPY (EGD) WITH PROPOFOL  N/A 12/17/2021   Procedure: ESOPHAGOGASTRODUODENOSCOPY (EGD) WITH PROPOFOL ;  Surgeon: Albertus Gordy HERO, MD;  Location: [facility];  Service: Gastroenterology;  Laterality: N/A;   NO PAST SURGERIES     Family History  Problem Relation Age of Onset   Hyperlipidemia Father    Arthritis Maternal Grandmother    Diabetes Maternal Grandmother    Hyperlipidemia Maternal Grandmother    Hypertension Maternal Grandmother    Hypertension Maternal Grandfather    Hyperlipidemia Maternal Grandfather    Prostate cancer Maternal Grandfather    Cervical cancer Paternal Grandmother    Hypertension Paternal Grandfather    Heart disease Paternal Grandfather    Colon cancer Neg Hx    Esophageal cancer Neg Hx    Rectal cancer Neg Hx    Stomach cancer Neg Hx    Social History   Social History Narrative   Marital status/children/pets: Single.    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 If you have any questions, ask your nurse or doctor.          b complex vitamins capsule Take 1 capsule by mouth daily.   ketoconazole  2 % cream Commonly known as: NIZORAL  Apply topically 2 (two) times daily.   mesalamine  1000 MG suppository Commonly known as: Canasa  Place 1 suppository (1,000 mg total) rectally at bedtime for 14 days. Use suppository rectally at bedtime for 14 days then, use intermittently (not continuously) if symptoms occur.   rizatriptan  5 MG tablet Commonly known as: MAXALT  Take 1 tablet (5 mg total) by mouth as needed for migraine. May repeat in 2 hours if needed    vedolizumab  300 MG injection Commonly known as: ENTYVIO  Inject into the vein.   Vitamin D3 125 MCG (5000 UT) Caps Take 1 capsule (5,000 Units total) by mouth daily.        All past medical history, surgical history, allergies, family history, immunizations andmedications were updated in the EMR today and reviewed under the history and medication portions of their EMR.     No results found for this or any previous visit (from the past 2160 hours).   Patient was never admitted.   ROS: 14 pt review of systems performed and negative (unless mentioned in an HPI)  Objective: BP 130/80   Pulse 85   Temp 98.5 F (36.9 C)   Ht 5' 3 (1.6 m)   Wt 207 lb (93.9 kg)   SpO2 98%   BMI 36.67 kg/m  Physical Exam Constitutional:      General: He is not in acute distress.  Appearance: Normal appearance. He is not ill-appearing, toxic-appearing or diaphoretic.  HENT:     Head: Normocephalic and atraumatic.     Right Ear: Tympanic membrane, ear canal and external ear normal. There is no impacted cerumen.     Left Ear: Tympanic membrane, ear canal and external ear normal. There is no impacted cerumen.     Nose: Nose normal. No congestion or rhinorrhea.     Mouth/Throat:     Mouth: Mucous membranes are moist.     Pharynx: Oropharynx is clear. No oropharyngeal exudate or posterior oropharyngeal erythema.  Eyes:     General: No scleral icterus.       Right eye: No discharge.        Left eye: No discharge.     Extraocular Movements: Extraocular movements intact.     Pupils: Pupils are equal, round, and reactive to light.  Cardiovascular:     Rate and Rhythm: Normal rate and regular rhythm.     Pulses: Normal pulses.     Heart sounds: Normal heart sounds. No murmur heard.    No friction rub. No gallop.  Pulmonary:     Effort: Pulmonary effort is normal. No respiratory distress.     Breath sounds: Normal breath sounds. No stridor. No wheezing, rhonchi or rales.  Chest:     Chest  wall: No tenderness.  Abdominal:     General: Abdomen is flat. Bowel sounds are normal. There is no distension.     Palpations: Abdomen is soft. There is no mass.     Tenderness: There is no abdominal tenderness. There is no right CVA tenderness, left CVA tenderness, guarding or rebound.     Hernia: No hernia is present.  Musculoskeletal:        General: No swelling or tenderness. Normal range of motion.     Cervical back: Normal range of motion and neck supple.     Right lower leg: No edema.     Left lower leg: No edema.  Lymphadenopathy:     Cervical: No cervical adenopathy.  Skin:    General: Skin is warm and dry.     Coloration: Skin is not jaundiced.     Findings: No bruising, lesion or rash.  Neurological:     General: No focal deficit present.     Mental Status: He is alert and oriented to person, place, and time. Mental status is at baseline.     Cranial Nerves: No cranial nerve deficit.     Sensory: No sensory deficit.     Motor: No weakness.     Coordination: Coordination normal.     Gait: Gait normal.     Deep Tendon Reflexes: Reflexes normal.  Psychiatric:        Mood and Affect: Mood normal.        Behavior: Behavior normal.        Thought Content: Thought content normal.        Judgment: Judgment normal.     No results found.  Assessment/plan: Willie James is a 23 y.o. male present for CPE and chronic condition management Encounter for preventive health examination Colonoscopy: no fhx, routine screen at 43 Immunizations: tdap UTD 2017, Influenza-declined (encouraged yearly)'Infectious disease screening: HIV, hepatitis C and urine cytology not indicated (never SA) Patient was encouraged to exercise greater than 150 minutes a week. Patient was encouraged to choose a diet filled with fresh fruits and vegetables, and lean meats. AVS provided to patient today for education/recommendation on gender specific  health and safety maintenance.  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  Lipid panel collected today    Return in about 1 year (around 04/24/2025) for cpe (20 min), Routine chronic condition follow-up.   Orders Placed This Encounter  Procedures   CBC   Comprehensive metabolic panel with GFR   Hemoglobin A1c   Lipid panel   TSH   Vitamin D  (25 hydroxy)   B12 and Folate Panel   IBC + Ferritin   No orders of the defined types were placed in this encounter.  Referral Orders  No referral(s) requested today     Electronically signed by: Charlies Bellini, DO [facility]

## 2024-04-23 NOTE — Patient Instructions (Addendum)

## 2024-04-23 NOTE — Progress Notes (Signed)
 Diagnosis: , Ulcerative Colitis  Provider:  Praveen Mannam MD  Procedure: IV Infusion  IV Type: Peripheral, IV Location: R Antecubital  , Entyvio  (Vedolizumab ), Dose: 300 mg  Infusion Start Time: 1130  Infusion Stop Time: 1206  Post Infusion IV Care: Peripheral IV Discontinued  Discharge: Condition: Good, Destination: Home . AVS Declined  Performed by:  Leita FORBES Miles, LPN

## 2024-04-24 ENCOUNTER — Ambulatory Visit: Payer: Self-pay | Admitting: Family Medicine

## 2024-04-24 ENCOUNTER — Other Ambulatory Visit

## 2024-04-24 DIAGNOSIS — D500 Iron deficiency anemia secondary to blood loss (chronic): Secondary | ICD-10-CM

## 2024-04-24 LAB — COMPREHENSIVE METABOLIC PANEL WITH GFR
ALT: 12 U/L (ref 0–53)
AST: 12 U/L (ref 0–37)
Albumin: 4.6 g/dL (ref 3.5–5.2)
Alkaline Phosphatase: 56 U/L (ref 39–117)
BUN: 13 mg/dL (ref 6–23)
CO2: 25 meq/L (ref 19–32)
Calcium: 9.7 mg/dL (ref 8.4–10.5)
Chloride: 104 meq/L (ref 96–112)
Creatinine, Ser: 0.78 mg/dL (ref 0.40–1.50)
GFR: 124.19 mL/min (ref >60.00)
Glucose, Bld: 90 mg/dL (ref 70–99)
Potassium: 4 meq/L (ref 3.5–5.1)
Sodium: 139 meq/L (ref 135–145)
Total Bilirubin: 0.7 mg/dL (ref 0.2–1.2)
Total Protein: 7.3 g/dL (ref 6.0–8.3)

## 2024-04-24 LAB — LIPID PANEL
Cholesterol: 140 mg/dL (ref 0–200)
HDL: 28.1 mg/dL — ABNORMAL LOW (ref >39.00)
LDL Cholesterol: 56 mg/dL (ref 0–99)
NonHDL: 112.12
Total CHOL/HDL Ratio: 5
Triglycerides: 280 mg/dL — ABNORMAL HIGH (ref 0.0–149.0)
VLDL: 56 mg/dL — ABNORMAL HIGH (ref 0.0–40.0)

## 2024-04-24 LAB — IBC + FERRITIN
Ferritin: 34.6 ng/mL (ref 22.0–322.0)
Iron: 86 ug/dL (ref 42–165)
Saturation Ratios: 25 % (ref 20.0–50.0)
TIBC: 344.4 ug/dL (ref 250.0–450.0)
Transferrin: 246 mg/dL (ref 212.0–360.0)

## 2024-04-24 MED ORDER — VITAMIN D (ERGOCALCIFEROL) 1.25 MG (50000 UNIT) PO CAPS: ORAL_CAPSULE | ORAL | 11 refills | Status: AC

## 2024-04-24 NOTE — Telephone Encounter (Signed)
 Please call patient - B12 deficiency-due to his ulcerative colitis, he does not absorb B12 well.  His levels are low.  I recommend he switch to the B12 1000 mcg sublingual solution-which is placed underneath the tongue daily and held for 30 seconds for absorption.  This is over-the-counter and can be bought on [employer].  This is not a prescription formula he needs to purchase over-the-counter. -Vitamin D  deficiency-levels are still very low at 17.  I have called in a prescription of high-dose vitamin D  for him to take twice weekly. -Iron  levels are stable and in normal range. - Cholesterol panel overall looks good, with the exception of elevated triglycerides.  Normal triglyceride level is less than 150.  He is triglycerides are 280.       - Exercise at least 150 minutes a week will help lower triglycerides..        - Avoid deep-fried or fatty foods.       - Avoid butter and oils.  If needing to use oil, olive oil is best.       -Increase the fiber content in the diet.  If able to tolerate it, can add a serving of Metamucil daily  -Liver function, kidney function, thyroid  function, electrolytes, blood cell counts are all normal.  Follow-up in 2 months with provider appointment for follow-up and labs.  Please schedule this for him

## 2024-04-25 ENCOUNTER — Ambulatory Visit: Payer: Self-pay | Admitting: Family Medicine

## 2024-04-25 LAB — HEMOGLOBIN A1C
Est. average glucose Bld gHb Est-mCnc: <74 mg/dL
Hgb A1c MFr Bld: <4.2 % — ABNORMAL LOW (ref 4.8–5.6)

## 2024-05-06 ENCOUNTER — Ambulatory Visit
Admission: RE | Admit: 2024-05-06 | Discharge: 2024-05-06 | Disposition: A | Discharge status: Home / Self Care | Source: Ambulatory Visit | Attending: Family Medicine

## 2024-05-06 ENCOUNTER — Other Ambulatory Visit: Payer: Self-pay

## 2024-05-06 VITALS — BP 140/87 | HR 112 | Temp 97.9°F | Resp 16 | Wt 205.0 lb

## 2024-05-06 DIAGNOSIS — L0291 Cutaneous abscess, unspecified: Secondary | ICD-10-CM

## 2024-05-06 MED ORDER — AMOXICILLIN-POT CLAVULANATE 875-125 MG PO TABS: 1.0000 | ORAL_TABLET | Freq: Two times a day (BID) | ORAL | 0 refills | Status: AC

## 2024-05-06 NOTE — ED Provider Notes (Signed)
 Willie James CARE    CSN: [id number] Arrival date & time: 05/06/24  1417      History   Chief Complaint Chief Complaint  Patient presents with   Abscess    Is around my groin area, is  red, swollen, sensitive, and painful, I have it for about 3 days - Entered by patient    HPI Willie James is a 23 y.o. male.   Patient has an abscess in his left groin.  It is growing and painful.  No drainage.  He has never had an abscess before.  He sometimes has had skin bumps from shaving the area. Patient has ulcerative colitis.  He is on Entyvio  infusions.  He was told that this might increase his risk of infection    Past Medical History:  Diagnosis Date   Frequent headaches    Left ventricular hypertrophy by electrocardiogram 10/28/2015   echo after was normal.    Palpitations 10/30/2015   pt reports palpitaitons and chest discomfort. EKG (reported LVH) and then NORMAL echo completed.    SIRS (systemic inflammatory response syndrome) (HCC) 12/15/2021   Ulcerative colitis [facility])     Patient Active Problem List   Diagnosis Date Noted   Increased frequency of headaches 03/17/2023   B12 deficiency 03/17/2023   Vitamin D  deficiency 11/30/2022   Acute esophagitis    Ulcerative colitis (HCC) 12/15/2021   IDA (iron  deficiency anemia) 12/10/2021   Hypertriglyceridemia 01/07/2020   e66.9 (BMI 30-39.9) 01/03/2018   Palpitations 01/03/2018    Past Surgical History:  Procedure Laterality Date   BIOPSY  12/17/2021   Procedure: BIOPSY;  Surgeon: Albertus Gordy HERO, MD;  Location: [facility];  Service: Gastroenterology;;  EGD and COLON   COLONOSCOPY N/A 12/17/2021   Procedure: COLONOSCOPY;  Surgeon: Albertus Gordy HERO, MD;  Location: [facility];  Service: Gastroenterology;  Laterality: N/A;   ESOPHAGOGASTRODUODENOSCOPY (EGD) WITH PROPOFOL  N/A 12/17/2021   Procedure: ESOPHAGOGASTRODUODENOSCOPY (EGD) WITH PROPOFOL ;  Surgeon: Albertus Gordy HERO, MD;  Location: [facility];  Service:  Gastroenterology;  Laterality: N/A;   NO PAST SURGERIES         Home Medications    Prior to Admission medications  Medication Sig Start Date End Date Taking? Authorizing Provider  amoxicillin -clavulanate (AUGMENTIN ) 875-125 MG tablet Take 1 tablet by mouth every 12 (twelve) hours. 05/06/24  Yes Maranda Jamee Jacob, MD  vedolizumab  (ENTYVIO ) 300 MG injection Inject into the vein.    [provider]  Vitamin D , Ergocalciferol , (DRISDOL ) 1.25 MG (50000 UNIT) CAPS capsule 1 capsule p.o. Monday and Friday weekly 04/24/24   Catherine Charlies LABOR, DO    Family History Family History  Problem Relation Age of Onset   Hyperlipidemia Father    Arthritis Maternal Grandmother    Diabetes Maternal Grandmother    Hyperlipidemia Maternal Grandmother    Hypertension Maternal Grandmother    Hypertension Maternal Grandfather    Hyperlipidemia Maternal Grandfather    Prostate cancer Maternal Grandfather    Cervical cancer Paternal Grandmother    Hypertension Paternal Grandfather    Heart disease Paternal Grandfather    Colon cancer Neg Hx    Esophageal cancer Neg Hx    Rectal cancer Neg Hx    Stomach cancer Neg Hx     Social History Social History[1]   Allergies   Patient has no known allergies.   Review of Systems Review of Systems See HPI  Physical Exam Triage Vital Signs ED Triage Vitals  Encounter Vitals Group     BP  05/06/24 1421 (!) 140/87     Girls Systolic BP Percentile --      Girls Diastolic BP Percentile --      Boys Systolic BP Percentile --      Boys Diastolic BP Percentile --      Pulse Rate 05/06/24 1421 (!) 112     Resp 05/06/24 1421 16     Temp 05/06/24 1421 97.9 F (36.6 C)     Temp src --      SpO2 05/06/24 1421 98 %     Weight 05/06/24 1424 205 lb (93 kg)     Height --      Head Circumference --      Peak Flow --      Pain Score 05/06/24 1423 5     Pain Loc --      Pain Education --      Exclude from Growth Chart --    No data  found.  Updated Vital Signs BP (!) 140/87   Pulse (!) 112   Temp 97.9 F (36.6 C)   Resp 16   Wt 93 kg   SpO2 98%   BMI 36.31 kg/m      Physical Exam Constitutional:      General: He is not in acute distress.    Appearance: He is well-developed.  HENT:     Head: Normocephalic and atraumatic.  Eyes:     Conjunctiva/sclera: Conjunctivae normal.     Pupils: Pupils are equal, round, and reactive to light.  Cardiovascular:     Rate and Rhythm: Normal rate.  Pulmonary:     Effort: Pulmonary effort is normal. No respiratory distress.  Abdominal:     General: There is no distension.     Palpations: Abdomen is soft.     Hernia: No hernia is present.     Comments: In the left groin there is an abscess that measures 2 x 4 cm.  Fluctuant.  No surrounding cellulitis.  With patient's permission the area was anesthetized with 1% lidocaine  and then a stab incision was made.  Purulence drained.  Gauze wick was placed.  Bandage  Musculoskeletal:        General: Normal range of motion.     Cervical back: Normal range of motion.  Skin:    General: Skin is warm and dry.  Neurological:     Mental Status: He is alert.      UC Treatments / Results  Labs (all labs ordered are listed, but only abnormal results are displayed) Labs Reviewed - No data to display  EKG   Radiology No results found.  Procedures Incision and Drainage  Date/Time: 05/06/2024 3:13 PM  Performed by: Maranda Jamee Jacob, MD Authorized by: Maranda Jamee Jacob, MD   Consent:    Consent obtained:  Verbal   Consent given by:  Patient   Risks discussed:  Incomplete drainage   Alternatives discussed:  No treatment Universal protocol:    Patient identity confirmed:  Verbally with patient Location:    Type:  Abscess   Location:  Trunk   Trunk location:  Abdomen Pre-procedure details:    Skin preparation:  Antiseptic wash Sedation:    Sedation type:  None Anesthesia:    Anesthesia method:  Local  infiltration   Local anesthetic:  Lidocaine  1% WITH epi Procedure type:    Complexity:  Simple Procedure details:    Ultrasound guidance: no     Needle aspiration: no     Incision types:  Stab incision   Incision depth:  Subcutaneous   Wound management:  Probed and deloculated   Drainage:  Purulent   Drainage amount:  Moderate   Wound treatment:  Wound left open   Packing materials:  1/4 in iodoform gauze Post-procedure details:    Procedure completion:  Tolerated  (including critical care time)  Medications Ordered in UC Medications - No data to display  Initial Impression / Assessment and Plan / UC Course  I have reviewed the triage vital signs and the nursing notes.  Pertinent labs & imaging results that were available during my care of the patient were reviewed by me and considered in my medical decision making (see chart for details).     Final Clinical Impressions(s) / UC Diagnoses   Final diagnoses:  Abscess     Discharge Instructions      Warm compresses to the area may help May take Tylenol  or ibuprofen as needed for pain Take the antibiotic 2 times a day Return if needed    ED Prescriptions     Medication Sig Dispense Auth. Provider   amoxicillin -clavulanate (AUGMENTIN ) 875-125 MG tablet Take 1 tablet by mouth every 12 (twelve) hours. 14 tablet Maranda Jamee Jacob, MD      PDMP not reviewed this encounter.    [1]  Social History Tobacco Use   Smoking status: Never    Passive exposure: Never   Smokeless tobacco: Never  Vaping Use   Vaping status: Never Used  Substance Use Topics   Alcohol use: Never   Drug use: Never     Maranda Jamee Jacob, MD 05/06/24 1514

## 2024-05-06 NOTE — ED Triage Notes (Addendum)
 C/o abscess to left groin that is red, swollen, painful x 3 days. No fever. No otc meds.

## 2024-05-06 NOTE — Discharge Instructions (Signed)
 Warm compresses to the area may help May take Tylenol  or ibuprofen as needed for pain Take the antibiotic 2 times a day Return if needed

## 2024-05-09 ENCOUNTER — Inpatient Hospital Stay: Attending: Hematology and Oncology

## 2024-05-11 ENCOUNTER — Inpatient Hospital Stay: Admitting: Hematology and Oncology

## 2024-06-18 ENCOUNTER — Ambulatory Visit

## 2024-06-20 ENCOUNTER — Ambulatory Visit

## 2024-06-20 VITALS — BP 117/77 | HR 84 | Temp 98.7°F | Resp 16 | Ht 63.0 in | Wt 202.4 lb

## 2024-06-20 DIAGNOSIS — K51919 Ulcerative colitis, unspecified with unspecified complications: Secondary | ICD-10-CM

## 2024-06-20 DIAGNOSIS — K921 Melena: Secondary | ICD-10-CM | POA: Diagnosis not present

## 2024-06-20 MED ORDER — VEDOLIZUMAB 300 MG IV SOLR
300.0000 mg | Freq: Once | INTRAVENOUS | Status: AC
  Administered 2024-06-20: 300 mg via INTRAVENOUS
  Filled 2024-06-20: qty 5

## 2024-06-20 NOTE — Progress Notes (Signed)
 Diagnosis:  Ulcerative Colitis  Provider:  Lonna Coder MD  Procedure: IV Infusion  IV Type: Peripheral, IV Location: L Forearm   Entyvio  (Vedolizumab ), Dose: 300 mg  Infusion Start Time: 1138  Infusion Stop Time: 1212  Post Infusion IV Care: Peripheral IV Discontinued  Discharge: Condition: Good, Destination: Home . AVS Declined  Performed by:  Maximiano JONELLE Pouch, LPN

## 2024-08-15 ENCOUNTER — Ambulatory Visit
